# Patient Record
Sex: Female | Born: 1950 | Race: Black or African American | Hispanic: No | Marital: Married | Smoking: Never smoker
Health system: Southern US, Community
[De-identification: ages and names within clinical notes are randomized; demographics above are authoritative.]

## PROBLEM LIST (undated history)

## (undated) DIAGNOSIS — M797 Fibromyalgia: Secondary | ICD-10-CM

## (undated) DIAGNOSIS — B977 Papillomavirus as the cause of diseases classified elsewhere: Secondary | ICD-10-CM

## (undated) DIAGNOSIS — M2550 Pain in unspecified joint: Secondary | ICD-10-CM

## (undated) DIAGNOSIS — K222 Esophageal obstruction: Secondary | ICD-10-CM

## (undated) DIAGNOSIS — E739 Lactose intolerance, unspecified: Secondary | ICD-10-CM

## (undated) DIAGNOSIS — M8580 Other specified disorders of bone density and structure, unspecified site: Secondary | ICD-10-CM

## (undated) DIAGNOSIS — M549 Dorsalgia, unspecified: Secondary | ICD-10-CM

## (undated) DIAGNOSIS — G2581 Restless legs syndrome: Secondary | ICD-10-CM

## (undated) DIAGNOSIS — R55 Syncope and collapse: Secondary | ICD-10-CM

## (undated) DIAGNOSIS — R42 Dizziness and giddiness: Secondary | ICD-10-CM

## (undated) DIAGNOSIS — C801 Malignant (primary) neoplasm, unspecified: Secondary | ICD-10-CM

## (undated) DIAGNOSIS — R7303 Prediabetes: Secondary | ICD-10-CM

## (undated) DIAGNOSIS — G4700 Insomnia, unspecified: Secondary | ICD-10-CM

## (undated) DIAGNOSIS — G8929 Other chronic pain: Secondary | ICD-10-CM

## (undated) DIAGNOSIS — M419 Scoliosis, unspecified: Secondary | ICD-10-CM

## (undated) DIAGNOSIS — I10 Essential (primary) hypertension: Secondary | ICD-10-CM

## (undated) DIAGNOSIS — E010 Iodine-deficiency related diffuse (endemic) goiter: Secondary | ICD-10-CM

## (undated) DIAGNOSIS — M1990 Unspecified osteoarthritis, unspecified site: Secondary | ICD-10-CM

## (undated) DIAGNOSIS — R739 Hyperglycemia, unspecified: Secondary | ICD-10-CM

## (undated) DIAGNOSIS — Z780 Asymptomatic menopausal state: Secondary | ICD-10-CM

## (undated) DIAGNOSIS — K219 Gastro-esophageal reflux disease without esophagitis: Secondary | ICD-10-CM

## (undated) DIAGNOSIS — T7840XA Allergy, unspecified, initial encounter: Secondary | ICD-10-CM

## (undated) HISTORY — PX: TUBAL LIGATION: SHX77

## (undated) HISTORY — DX: Malignant (primary) neoplasm, unspecified: C80.1

## (undated) HISTORY — DX: Asymptomatic menopausal state: Z78.0

## (undated) HISTORY — DX: Hyperglycemia, unspecified: R73.9

## (undated) HISTORY — DX: Restless legs syndrome: G25.81

## (undated) HISTORY — DX: Iodine-deficiency related diffuse (endemic) goiter: E01.0

## (undated) HISTORY — PX: SPINE SURGERY: SHX786

## (undated) HISTORY — DX: Fibromyalgia: M79.7

## (undated) HISTORY — DX: Insomnia, unspecified: G47.00

## (undated) HISTORY — DX: Scoliosis, unspecified: M41.9

## (undated) HISTORY — PX: BUNIONECTOMY: SHX129

## (undated) HISTORY — DX: Dorsalgia, unspecified: M54.9

## (undated) HISTORY — DX: Prediabetes: R73.03

## (undated) HISTORY — DX: Allergy, unspecified, initial encounter: T78.40XA

## (undated) HISTORY — PX: TONSILLECTOMY: SUR1361

## (undated) HISTORY — DX: Syncope and collapse: R55

## (undated) HISTORY — DX: Pain in unspecified joint: M25.50

## (undated) HISTORY — DX: Esophageal obstruction: K22.2

## (undated) HISTORY — DX: Other chronic pain: G89.29

## (undated) HISTORY — DX: Dizziness and giddiness: R42

## (undated) HISTORY — PX: KNEE SURGERY: SHX244

## (undated) HISTORY — DX: Gastro-esophageal reflux disease without esophagitis: K21.9

## (undated) HISTORY — PX: COLONOSCOPY: SHX174

## (undated) HISTORY — DX: Lactose intolerance, unspecified: E73.9

## (undated) HISTORY — DX: Papillomavirus as the cause of diseases classified elsewhere: B97.7

## (undated) HISTORY — DX: Unspecified osteoarthritis, unspecified site: M19.90

## (undated) HISTORY — DX: Other specified disorders of bone density and structure, unspecified site: M85.80

## (undated) HISTORY — DX: Essential (primary) hypertension: I10

---

## 1998-04-12 HISTORY — PX: BREAST EXCISIONAL BIOPSY: SUR124

## 1998-04-12 HISTORY — PX: BREAST LUMPECTOMY: SHX2

## 1998-05-23 ENCOUNTER — Ambulatory Visit (HOSPITAL_BASED_OUTPATIENT_CLINIC_OR_DEPARTMENT_OTHER): Admission: RE | Admit: 1998-05-23 | Discharge: 1998-05-23 | Payer: Self-pay | Admitting: General Surgery

## 1998-06-17 ENCOUNTER — Ambulatory Visit (HOSPITAL_BASED_OUTPATIENT_CLINIC_OR_DEPARTMENT_OTHER): Admission: RE | Admit: 1998-06-17 | Discharge: 1998-06-17 | Payer: Self-pay | Admitting: Orthopaedic Surgery

## 1999-05-22 ENCOUNTER — Encounter: Admission: RE | Admit: 1999-05-22 | Discharge: 1999-05-22 | Payer: Self-pay | Admitting: Obstetrics and Gynecology

## 1999-05-22 ENCOUNTER — Encounter: Payer: Self-pay | Admitting: Obstetrics and Gynecology

## 2000-05-27 ENCOUNTER — Encounter: Payer: Self-pay | Admitting: Obstetrics and Gynecology

## 2000-05-27 ENCOUNTER — Encounter: Admission: RE | Admit: 2000-05-27 | Discharge: 2000-05-27 | Payer: Self-pay | Admitting: Obstetrics and Gynecology

## 2000-07-13 ENCOUNTER — Encounter (INDEPENDENT_AMBULATORY_CARE_PROVIDER_SITE_OTHER): Payer: Self-pay | Admitting: Specialist

## 2000-07-13 ENCOUNTER — Other Ambulatory Visit: Admission: RE | Admit: 2000-07-13 | Discharge: 2000-07-13 | Payer: Self-pay | Admitting: Obstetrics and Gynecology

## 2000-08-29 ENCOUNTER — Inpatient Hospital Stay (HOSPITAL_COMMUNITY): Admission: EM | Admit: 2000-08-29 | Discharge: 2000-08-31 | Payer: Self-pay | Admitting: Internal Medicine

## 2000-08-29 ENCOUNTER — Encounter: Payer: Self-pay | Admitting: Internal Medicine

## 2001-01-10 ENCOUNTER — Ambulatory Visit (HOSPITAL_COMMUNITY): Admission: RE | Admit: 2001-01-10 | Discharge: 2001-01-11 | Payer: Self-pay | Admitting: Orthopaedic Surgery

## 2001-04-12 HISTORY — PX: KNEE ARTHROSCOPY: SHX127

## 2002-02-06 ENCOUNTER — Ambulatory Visit (HOSPITAL_BASED_OUTPATIENT_CLINIC_OR_DEPARTMENT_OTHER): Admission: RE | Admit: 2002-02-06 | Discharge: 2002-02-06 | Payer: Self-pay | Admitting: Orthopaedic Surgery

## 2002-04-12 HISTORY — PX: SHOULDER SURGERY: SHX246

## 2002-06-26 ENCOUNTER — Ambulatory Visit (HOSPITAL_BASED_OUTPATIENT_CLINIC_OR_DEPARTMENT_OTHER): Admission: RE | Admit: 2002-06-26 | Discharge: 2002-06-26 | Payer: Self-pay | Admitting: Orthopaedic Surgery

## 2004-05-29 ENCOUNTER — Ambulatory Visit: Payer: Self-pay | Admitting: Internal Medicine

## 2004-05-29 ENCOUNTER — Encounter: Admission: RE | Admit: 2004-05-29 | Discharge: 2004-05-29 | Payer: Self-pay | Admitting: Internal Medicine

## 2004-06-02 ENCOUNTER — Ambulatory Visit: Payer: Self-pay

## 2004-06-24 ENCOUNTER — Ambulatory Visit: Payer: Self-pay | Admitting: Internal Medicine

## 2004-08-30 ENCOUNTER — Emergency Department (HOSPITAL_COMMUNITY): Admission: EM | Admit: 2004-08-30 | Discharge: 2004-08-30 | Payer: Self-pay | Admitting: Emergency Medicine

## 2004-09-02 ENCOUNTER — Ambulatory Visit: Payer: Self-pay | Admitting: Internal Medicine

## 2004-09-24 ENCOUNTER — Ambulatory Visit (HOSPITAL_COMMUNITY): Admission: RE | Admit: 2004-09-24 | Discharge: 2004-09-24 | Payer: Self-pay | Admitting: Obstetrics and Gynecology

## 2004-10-06 ENCOUNTER — Ambulatory Visit: Payer: Self-pay | Admitting: Gastroenterology

## 2004-11-03 ENCOUNTER — Ambulatory Visit: Payer: Self-pay | Admitting: Gastroenterology

## 2004-11-30 ENCOUNTER — Ambulatory Visit: Payer: Self-pay | Admitting: Gastroenterology

## 2004-12-01 ENCOUNTER — Ambulatory Visit (HOSPITAL_COMMUNITY): Admission: RE | Admit: 2004-12-01 | Discharge: 2004-12-01 | Payer: Self-pay | Admitting: Gastroenterology

## 2004-12-07 ENCOUNTER — Ambulatory Visit: Payer: Self-pay | Admitting: Internal Medicine

## 2004-12-17 ENCOUNTER — Ambulatory Visit: Payer: Self-pay | Admitting: Gastroenterology

## 2004-12-17 ENCOUNTER — Ambulatory Visit (HOSPITAL_COMMUNITY): Admission: RE | Admit: 2004-12-17 | Discharge: 2004-12-17 | Payer: Self-pay | Admitting: Gastroenterology

## 2005-01-20 ENCOUNTER — Ambulatory Visit: Payer: Self-pay | Admitting: Gastroenterology

## 2005-03-09 ENCOUNTER — Ambulatory Visit: Payer: Self-pay | Admitting: Internal Medicine

## 2005-03-26 ENCOUNTER — Encounter: Payer: Self-pay | Admitting: Internal Medicine

## 2005-10-05 ENCOUNTER — Ambulatory Visit (HOSPITAL_COMMUNITY): Admission: RE | Admit: 2005-10-05 | Discharge: 2005-10-05 | Payer: Self-pay | Admitting: Obstetrics and Gynecology

## 2005-10-19 ENCOUNTER — Ambulatory Visit (HOSPITAL_COMMUNITY): Admission: RE | Admit: 2005-10-19 | Discharge: 2005-10-19 | Payer: Self-pay | Admitting: Obstetrics and Gynecology

## 2005-10-20 ENCOUNTER — Encounter: Admission: RE | Admit: 2005-10-20 | Discharge: 2005-10-20 | Payer: Self-pay | Admitting: Obstetrics and Gynecology

## 2005-12-17 ENCOUNTER — Ambulatory Visit: Payer: Self-pay | Admitting: Internal Medicine

## 2006-02-01 ENCOUNTER — Ambulatory Visit: Payer: Self-pay | Admitting: Internal Medicine

## 2006-02-01 LAB — CONVERTED CEMR LAB
AST: 24 units/L (ref 0–37)
Albumin: 4 g/dL (ref 3.5–5.2)

## 2006-04-20 ENCOUNTER — Encounter: Payer: Self-pay | Admitting: Internal Medicine

## 2006-08-17 ENCOUNTER — Encounter: Payer: Self-pay | Admitting: Internal Medicine

## 2006-10-31 ENCOUNTER — Encounter: Admission: RE | Admit: 2006-10-31 | Discharge: 2006-10-31 | Payer: Self-pay | Admitting: Obstetrics and Gynecology

## 2006-11-11 LAB — CONVERTED CEMR LAB: Pap Smear: NORMAL

## 2006-12-21 ENCOUNTER — Ambulatory Visit: Payer: Self-pay | Admitting: Internal Medicine

## 2006-12-21 DIAGNOSIS — I10 Essential (primary) hypertension: Secondary | ICD-10-CM

## 2006-12-21 DIAGNOSIS — E042 Nontoxic multinodular goiter: Secondary | ICD-10-CM

## 2006-12-21 DIAGNOSIS — G2581 Restless legs syndrome: Secondary | ICD-10-CM

## 2006-12-21 DIAGNOSIS — M549 Dorsalgia, unspecified: Secondary | ICD-10-CM | POA: Insufficient documentation

## 2006-12-21 DIAGNOSIS — K222 Esophageal obstruction: Secondary | ICD-10-CM

## 2006-12-21 DIAGNOSIS — J309 Allergic rhinitis, unspecified: Secondary | ICD-10-CM | POA: Insufficient documentation

## 2006-12-21 DIAGNOSIS — M797 Fibromyalgia: Secondary | ICD-10-CM | POA: Insufficient documentation

## 2006-12-21 DIAGNOSIS — K219 Gastro-esophageal reflux disease without esophagitis: Secondary | ICD-10-CM

## 2007-01-18 ENCOUNTER — Encounter: Payer: Self-pay | Admitting: Internal Medicine

## 2007-03-17 ENCOUNTER — Telehealth (INDEPENDENT_AMBULATORY_CARE_PROVIDER_SITE_OTHER): Payer: Self-pay | Admitting: *Deleted

## 2007-03-31 ENCOUNTER — Telehealth (INDEPENDENT_AMBULATORY_CARE_PROVIDER_SITE_OTHER): Payer: Self-pay | Admitting: *Deleted

## 2007-05-29 ENCOUNTER — Telehealth (INDEPENDENT_AMBULATORY_CARE_PROVIDER_SITE_OTHER): Payer: Self-pay | Admitting: *Deleted

## 2007-06-20 ENCOUNTER — Ambulatory Visit: Payer: Self-pay | Admitting: Gastroenterology

## 2007-06-22 ENCOUNTER — Ambulatory Visit: Payer: Self-pay | Admitting: Gastroenterology

## 2007-06-22 ENCOUNTER — Encounter: Payer: Self-pay | Admitting: Internal Medicine

## 2007-06-22 ENCOUNTER — Encounter: Payer: Self-pay | Admitting: Gastroenterology

## 2007-07-20 ENCOUNTER — Telehealth: Payer: Self-pay | Admitting: Internal Medicine

## 2007-08-16 ENCOUNTER — Ambulatory Visit: Payer: Self-pay | Admitting: Gastroenterology

## 2007-08-28 ENCOUNTER — Telehealth (INDEPENDENT_AMBULATORY_CARE_PROVIDER_SITE_OTHER): Payer: Self-pay | Admitting: *Deleted

## 2007-08-28 ENCOUNTER — Encounter (INDEPENDENT_AMBULATORY_CARE_PROVIDER_SITE_OTHER): Payer: Self-pay | Admitting: *Deleted

## 2007-09-27 ENCOUNTER — Encounter: Payer: Self-pay | Admitting: Internal Medicine

## 2007-10-30 ENCOUNTER — Ambulatory Visit: Payer: Self-pay | Admitting: Internal Medicine

## 2007-11-01 ENCOUNTER — Encounter: Admission: RE | Admit: 2007-11-01 | Discharge: 2007-11-01 | Payer: Self-pay | Admitting: Obstetrics and Gynecology

## 2007-11-06 LAB — CONVERTED CEMR LAB
ALT: 21 units/L (ref 0–35)
AST: 24 units/L (ref 0–37)
BUN: 16 mg/dL (ref 6–23)
Calcium: 9.8 mg/dL (ref 8.4–10.5)
Chloride: 101 meq/L (ref 96–112)
Creatinine, Ser: 0.9 mg/dL (ref 0.4–1.2)
GFR calc Af Amer: 83 mL/min
GFR calc non Af Amer: 69 mL/min
Glucose, Bld: 79 mg/dL (ref 70–99)
HDL: 55.7 mg/dL (ref >39.0)
LDL Cholesterol: 122 mg/dL — ABNORMAL HIGH (ref 0–99)
Potassium: 3.7 meq/L (ref 3.5–5.1)
VLDL: 12 mg/dL (ref 0–40)

## 2007-11-21 ENCOUNTER — Telehealth (INDEPENDENT_AMBULATORY_CARE_PROVIDER_SITE_OTHER): Payer: Self-pay | Admitting: *Deleted

## 2007-11-27 ENCOUNTER — Telehealth (INDEPENDENT_AMBULATORY_CARE_PROVIDER_SITE_OTHER): Payer: Self-pay | Admitting: *Deleted

## 2007-11-29 ENCOUNTER — Encounter: Payer: Self-pay | Admitting: Internal Medicine

## 2007-12-01 ENCOUNTER — Telehealth (INDEPENDENT_AMBULATORY_CARE_PROVIDER_SITE_OTHER): Payer: Self-pay | Admitting: *Deleted

## 2008-01-02 ENCOUNTER — Encounter: Payer: Self-pay | Admitting: Internal Medicine

## 2008-03-25 ENCOUNTER — Telehealth (INDEPENDENT_AMBULATORY_CARE_PROVIDER_SITE_OTHER): Payer: Self-pay | Admitting: *Deleted

## 2008-04-16 ENCOUNTER — Ambulatory Visit: Payer: Self-pay | Admitting: Diagnostic Radiology

## 2008-04-16 ENCOUNTER — Inpatient Hospital Stay (HOSPITAL_COMMUNITY): Admission: EM | Admit: 2008-04-16 | Discharge: 2008-04-18 | Payer: Self-pay | Admitting: Internal Medicine

## 2008-04-16 ENCOUNTER — Ambulatory Visit: Payer: Self-pay | Admitting: Internal Medicine

## 2008-04-18 ENCOUNTER — Telehealth (INDEPENDENT_AMBULATORY_CARE_PROVIDER_SITE_OTHER): Payer: Self-pay | Admitting: *Deleted

## 2008-04-22 ENCOUNTER — Encounter: Admission: RE | Admit: 2008-04-22 | Discharge: 2008-04-22 | Payer: Self-pay | Admitting: Internal Medicine

## 2008-04-23 ENCOUNTER — Ambulatory Visit: Payer: Self-pay

## 2008-04-23 ENCOUNTER — Encounter: Payer: Self-pay | Admitting: Internal Medicine

## 2008-04-26 ENCOUNTER — Telehealth (INDEPENDENT_AMBULATORY_CARE_PROVIDER_SITE_OTHER): Payer: Self-pay | Admitting: *Deleted

## 2008-04-30 ENCOUNTER — Telehealth (INDEPENDENT_AMBULATORY_CARE_PROVIDER_SITE_OTHER): Payer: Self-pay | Admitting: *Deleted

## 2008-05-01 ENCOUNTER — Encounter: Payer: Self-pay | Admitting: Internal Medicine

## 2008-05-08 ENCOUNTER — Telehealth (INDEPENDENT_AMBULATORY_CARE_PROVIDER_SITE_OTHER): Payer: Self-pay | Admitting: *Deleted

## 2008-05-16 ENCOUNTER — Encounter: Payer: Self-pay | Admitting: Internal Medicine

## 2008-05-16 ENCOUNTER — Telehealth (INDEPENDENT_AMBULATORY_CARE_PROVIDER_SITE_OTHER): Payer: Self-pay | Admitting: *Deleted

## 2008-07-04 ENCOUNTER — Telehealth (INDEPENDENT_AMBULATORY_CARE_PROVIDER_SITE_OTHER): Payer: Self-pay | Admitting: *Deleted

## 2008-09-03 ENCOUNTER — Encounter: Payer: Self-pay | Admitting: Internal Medicine

## 2008-09-11 ENCOUNTER — Encounter: Payer: Self-pay | Admitting: Internal Medicine

## 2008-09-12 ENCOUNTER — Encounter: Payer: Self-pay | Admitting: Internal Medicine

## 2008-09-12 ENCOUNTER — Telehealth (INDEPENDENT_AMBULATORY_CARE_PROVIDER_SITE_OTHER): Payer: Self-pay | Admitting: *Deleted

## 2008-09-17 ENCOUNTER — Telehealth (INDEPENDENT_AMBULATORY_CARE_PROVIDER_SITE_OTHER): Payer: Self-pay | Admitting: *Deleted

## 2008-10-18 ENCOUNTER — Telehealth (INDEPENDENT_AMBULATORY_CARE_PROVIDER_SITE_OTHER): Payer: Self-pay | Admitting: *Deleted

## 2008-10-21 ENCOUNTER — Telehealth (INDEPENDENT_AMBULATORY_CARE_PROVIDER_SITE_OTHER): Payer: Self-pay | Admitting: *Deleted

## 2008-11-13 ENCOUNTER — Encounter: Admission: RE | Admit: 2008-11-13 | Discharge: 2008-11-13 | Payer: Self-pay | Admitting: Internal Medicine

## 2008-11-13 LAB — HM MAMMOGRAPHY: HM Mammogram: NEGATIVE

## 2008-11-19 ENCOUNTER — Encounter (INDEPENDENT_AMBULATORY_CARE_PROVIDER_SITE_OTHER): Payer: Self-pay | Admitting: *Deleted

## 2008-12-26 ENCOUNTER — Telehealth (INDEPENDENT_AMBULATORY_CARE_PROVIDER_SITE_OTHER): Payer: Self-pay | Admitting: *Deleted

## 2009-01-01 ENCOUNTER — Ambulatory Visit: Payer: Self-pay | Admitting: Internal Medicine

## 2009-01-01 DIAGNOSIS — N951 Menopausal and female climacteric states: Secondary | ICD-10-CM

## 2009-01-01 DIAGNOSIS — F411 Generalized anxiety disorder: Secondary | ICD-10-CM | POA: Insufficient documentation

## 2009-01-02 ENCOUNTER — Encounter: Admission: RE | Admit: 2009-01-02 | Discharge: 2009-01-02 | Payer: Self-pay | Admitting: Internal Medicine

## 2009-01-07 LAB — CONVERTED CEMR LAB
Albumin: 4.1 g/dL (ref 3.5–5.2)
BUN: 17 mg/dL (ref 6–23)
Basophils Relative: 0.3 % (ref 0.0–3.0)
Bilirubin, Direct: 0 mg/dL (ref 0.0–0.3)
Creatinine, Ser: 1 mg/dL (ref 0.4–1.2)
Eosinophils Absolute: 0.1 10*3/uL (ref 0.0–0.7)
GFR calc non Af Amer: 73.41 mL/min (ref >60)
HCT: 37.9 % (ref 36.0–46.0)
Lymphocytes Relative: 32.7 % (ref 12.0–46.0)
Lymphs Abs: 1.7 10*3/uL (ref 0.7–4.0)
MCHC: 33.1 g/dL (ref 30.0–36.0)
MCV: 84.8 fL (ref 78.0–100.0)
Monocytes Absolute: 0.4 10*3/uL (ref 0.1–1.0)
Neutro Abs: 2.9 10*3/uL (ref 1.4–7.7)
Neutrophils Relative %: 57.3 % (ref 43.0–77.0)
Platelets: 137 10*3/uL — ABNORMAL LOW (ref 150.0–400.0)
Potassium: 3.3 meq/L — ABNORMAL LOW (ref 3.5–5.1)
RBC: 4.47 M/uL (ref 3.87–5.11)
TSH: 0.48 microintl units/mL (ref 0.35–5.50)
Total CHOL/HDL Ratio: 4
Total Protein: 7.6 g/dL (ref 6.0–8.3)
Triglycerides: 70 mg/dL (ref 0.0–149.0)
VLDL: 14 mg/dL (ref 0.0–40.0)

## 2009-01-09 ENCOUNTER — Telehealth (INDEPENDENT_AMBULATORY_CARE_PROVIDER_SITE_OTHER): Payer: Self-pay | Admitting: *Deleted

## 2009-01-10 HISTORY — PX: BIOPSY THYROID: PRO38

## 2009-01-14 ENCOUNTER — Other Ambulatory Visit: Admission: RE | Admit: 2009-01-14 | Discharge: 2009-01-14 | Payer: Self-pay | Admitting: Diagnostic Radiology

## 2009-01-14 ENCOUNTER — Encounter: Admission: RE | Admit: 2009-01-14 | Discharge: 2009-01-14 | Payer: Self-pay | Admitting: Internal Medicine

## 2009-01-14 ENCOUNTER — Encounter (INDEPENDENT_AMBULATORY_CARE_PROVIDER_SITE_OTHER): Payer: Self-pay | Admitting: Diagnostic Radiology

## 2009-01-17 ENCOUNTER — Telehealth: Payer: Self-pay | Admitting: Internal Medicine

## 2009-01-17 ENCOUNTER — Encounter: Payer: Self-pay | Admitting: Internal Medicine

## 2009-07-11 DIAGNOSIS — R55 Syncope and collapse: Secondary | ICD-10-CM

## 2009-07-11 HISTORY — DX: Syncope and collapse: R55

## 2009-07-20 ENCOUNTER — Ambulatory Visit: Payer: Self-pay | Admitting: Diagnostic Radiology

## 2009-07-20 ENCOUNTER — Encounter (HOSPITAL_COMMUNITY): Payer: Self-pay | Admitting: Emergency Medicine

## 2009-07-20 ENCOUNTER — Inpatient Hospital Stay (HOSPITAL_COMMUNITY): Admission: AD | Admit: 2009-07-20 | Discharge: 2009-07-22 | Payer: Self-pay | Admitting: Internal Medicine

## 2009-07-21 ENCOUNTER — Ambulatory Visit: Payer: Self-pay | Admitting: Vascular Surgery

## 2009-07-21 ENCOUNTER — Telehealth: Payer: Self-pay | Admitting: Internal Medicine

## 2009-07-21 ENCOUNTER — Encounter (INDEPENDENT_AMBULATORY_CARE_PROVIDER_SITE_OTHER): Payer: Self-pay | Admitting: Internal Medicine

## 2009-07-22 ENCOUNTER — Encounter: Payer: Self-pay | Admitting: Internal Medicine

## 2009-07-28 ENCOUNTER — Ambulatory Visit: Payer: Self-pay | Admitting: Internal Medicine

## 2009-07-30 LAB — CONVERTED CEMR LAB
CO2: 32 meq/L (ref 19–32)
Creatinine, Ser: 1 mg/dL (ref 0.4–1.2)
Glucose, Bld: 75 mg/dL (ref 70–99)
Sodium: 141 meq/L (ref 135–145)

## 2009-09-23 ENCOUNTER — Ambulatory Visit (HOSPITAL_COMMUNITY): Admission: RE | Admit: 2009-09-23 | Discharge: 2009-09-23 | Payer: Self-pay | Admitting: Obstetrics and Gynecology

## 2009-11-12 ENCOUNTER — Telehealth (INDEPENDENT_AMBULATORY_CARE_PROVIDER_SITE_OTHER): Payer: Self-pay | Admitting: *Deleted

## 2009-11-21 ENCOUNTER — Encounter: Payer: Self-pay | Admitting: Internal Medicine

## 2010-03-20 ENCOUNTER — Encounter: Payer: Self-pay | Admitting: Internal Medicine

## 2010-03-20 ENCOUNTER — Ambulatory Visit: Payer: Self-pay | Admitting: Internal Medicine

## 2010-03-23 LAB — CONVERTED CEMR LAB
CO2: 31 meq/L (ref 19–32)
Calcium: 9.7 mg/dL (ref 8.4–10.5)
Creatinine, Ser: 0.94 mg/dL (ref 0.40–1.20)
Potassium: 3.7 meq/L (ref 3.5–5.3)

## 2010-05-03 ENCOUNTER — Encounter: Payer: Self-pay | Admitting: Obstetrics and Gynecology

## 2010-05-03 ENCOUNTER — Encounter: Payer: Self-pay | Admitting: Gastroenterology

## 2010-05-04 ENCOUNTER — Encounter: Payer: Self-pay | Admitting: Internal Medicine

## 2010-05-05 ENCOUNTER — Encounter: Payer: Self-pay | Admitting: Internal Medicine

## 2010-05-10 LAB — CONVERTED CEMR LAB
AST: 36 units/L (ref 0–37)
BUN: 17 mg/dL (ref 6–23)
Basophils Relative: 0.8 % (ref 0.0–1.0)
Calcium: 10 mg/dL (ref 8.4–10.5)
Cholesterol: 200 mg/dL (ref 0–200)
Creatinine, Ser: 1 mg/dL (ref 0.4–1.2)
Eosinophils Relative: 2.2 % (ref 0.0–5.0)
GFR calc Af Amer: 74 mL/min
Glucose, Bld: 88 mg/dL (ref 70–99)
Hemoglobin: 12.3 g/dL (ref 12.0–15.0)
LDL Cholesterol: 134 mg/dL — ABNORMAL HIGH (ref 0–99)
MCV: 82.9 fL (ref 78.0–100.0)
Monocytes Absolute: 0.4 10*3/uL (ref 0.2–0.7)
Neutro Abs: 3.3 10*3/uL (ref 1.4–7.7)
Platelets: 171 10*3/uL (ref 150–400)
RBC: 4.53 M/uL (ref 3.87–5.11)
Sodium: 141 meq/L (ref 135–145)
WBC: 5.4 10*3/uL (ref 4.5–10.5)

## 2010-05-11 ENCOUNTER — Telehealth: Payer: Self-pay | Admitting: Internal Medicine

## 2010-05-12 NOTE — Progress Notes (Signed)
Summary: Refill Request  Phone Note Refill Request Message from:  Patient on November 12, 2009 11:09 AM  Refills Requested: Medication #1:  PREVACID SOLUTAB 30 MG TBDP DISSOLVE 1 TAB IN MOUTH two times a day   Dosage confirmed as above?Dosage Confirmed   Brand Name Necessary? No   Supply Requested: 1 month [facility] [address], [facility]  Next Appointment Scheduled: none Initial call taken by: Harold Barban,  November 12, 2009 11:09 AM    Prescriptions: PREVACID SOLUTAB 30 MG TBDP (LANSOPRAZOLE) DISSOLVE 1 TAB IN MOUTH two times a day  #180 x 3   Entered by:   Army Fossa CMA   Authorized by:   Nolon Rod. Paz MD   Signed by:   Army Fossa CMA on 11/12/2009   Method used:   Electronically to        [facility] (retail)       [address]       [place], [place]  [address]       Ph: [phone] or [phone]       Fax: [phone]   RxID:   [id number]

## 2010-05-12 NOTE — Letter (Signed)
Summary: Encounter Notice/[facility]  Encounter Notice/[facility]   Imported By: Lanelle Bal 07/28/2009 11:04:19  _____________________________________________________________________  External Attachment:    Type:   Image     Comment:   External Document

## 2010-05-12 NOTE — Progress Notes (Signed)
Summary: triage call a nurse/  left msg for pt to call 2x  Phone Note Other Incoming   Summary of Call: [facility] Triage Call Report Triage Record Num: [id number] Operator: Jaci Carrel Patient Name: Leah Cameron Call Date & Time: 07/20/2009 11:01:29AM Patient Phone: [phone] PCP: Nolon Rod. Paz Patient Gender: Female PCP Fax : Patient DOB: Mar 27, 1951 Practice Name: [facility] Reason for Call: Pt calling with concerns about abdominal pain last pm. She woke up on floor last PM- she fell off toilet and hit shower stall. Pt states that she is sore and weak today. Today her sx: weakness, arm sore from fall. She denies any numbness/tingling, cloudy thought process. Advised EDProtocol( s) Used: Neurological Deficits Recommended Outcome per Protocol: See ED Immediately Reason for Outcome: Had recent (within last 24 hours) episode(s) of paralysis, weakness, numbness/tingling of an arm or leg or the face, especially on same side of body, AND NOW RESOLVED Care Advice:  ~ Another adult should drive. 04/  Follow-up for Phone Call        In E-Chart pt went to [facility] over weekend; after fainting  and falling while on toilet. Per E-Chart notes pt wanted to come in since a family hx of strokes. labs, xray and CT of head was done. ct was neg, xray neg.  -Pt was D/C stable Left msg for pt to call 2x got VM  Follow-up by: Kandice Hams,  July 21, 2009 9:40 AM  Additional Follow-up for Phone Call Additional follow up Details #1::        noted, thank you  Additional Follow-up by: [facility] E. Paz MD,  July 21, 2009 11:19 AM

## 2010-05-12 NOTE — Letter (Signed)
Summary: fibromyalgia followup, rheumatology  [facility]   Imported By: Lanelle Bal 12/04/2009 12:59:06  _____________________________________________________________________  External Attachment:    Type:   Image     Comment:   External Document

## 2010-05-12 NOTE — Assessment & Plan Note (Signed)
Summary: hosp followup/alr   Vital Signs:  Patient profile:   60 year old female Height:      67.75 inches Weight:      170.4 pounds BMI:     26.20 Pulse rate:   84 / minute BP sitting:   140 / 80  Vitals Entered By: Shary Decamp (July 28, 2009 10:31 AM) CC: hosp f/u   History of Present Illness: hospital follow-up, chart reviewed:      DATE OF ADMISSION:  07/20/2009   DATE OF DISCHARGE:  07/22/2009     DISCHARGE DIAGNOSES:   1. Syncope, likely secondary to his overtreatment of hypertension.   2. Possible orthostatic hypotension.   3. Spinal stenosis.   4. Fibromyalgia.   5. Restless leg syndrome.       CT of the  head without contrast was negative.   A chest x-ray did not show any  acute disease.    The patient had an MRI/MRA of the brain that  did not show any evidence of CVA.  The MRA showed anatomic variation in  both anterior and posterior circulation. There is diffuse tortuosity of the intracranial  arteries, that indicates chronic hypertension.   A 2-D echo was also done  that showed an EF of 55-60% without any regional wall motion  abnormalities.   Telemetry monitoring was negative.  Cardiac enzymes were  cycled and this was negative.   Carotid duplex ultrasound was done and  was found to be negative.   labs: Prior to discharge here potassium went up to 3.7, creatinine 0.8 TFTs norma B12 normal total cholesterol 176, triglyceride 94, LDL 109, HDL 48   The patient has had orthostatics in the ED as  well as inpatient.  Her only antihypertensive medication that was  continued was Lotensin, her Norvasc and HCTZ were discontinued.    Ambien was also discontinued since she had the syncope prior to taken it she is now on temazepam     Current Medications (verified): 1)  Lotensin 10 Mg  Tabs (Benazepril Hcl) .Marland Kitchen.. 1 By Mouth Qd 2)  Lexapro 10 Mg  Tabs (Escitalopram Oxalate) .Marland Kitchen.. 1 By Mouth Qd 3)  Restoril 15 Mg Caps (Temazepam) .Marland Kitchen.. 1 By Mouth At Bedtime As  Needed 4)  Asa 81 Mg .Marland Kitchen.. 1 Qd 5)  Calcium 6)  Vitamin B 7)  Lidoderm Patch .... 1 Every 12 Hours; Up To 3 Every 12 Hours 8)  Prevacid Solutab 30 Mg Tbdp (Lansoprazole) .... Dissolve 1 Tab in Mouth Two Times A Day 9)  Valium 2 Mg Tabs (Diazepam) .Marland Kitchen.. 1-2 Before Procedure 10)  K-Tabs 10 Meq Cr-Tabs (Potassium Chloride) .... Two Times A Day  Allergies (verified): 1)  ! Flexeril 2)  ! Ultram 3)  ! Pcn  Past History:  Past Medical History: GERD HTN Allergic rhinitis RLS Hx of THYROMEGALY  BACK PAIN, CHRONIC  STRICTURE, ESOPHAGEAL , last EGD and dilatation 3-09 FIBROMYALGIA , sees Dr Victory Dakin Cardiolite 05-2004 neg Menopause --on lexapro , was started by gyn  syncope 4/11, admitted, thought to be due to over treatment of hypertension  Social History: Reviewed history from 01/01/2009 and no changes required. Married two children Retired 2001, [employer] disable  active-- does some walking  diet-- doing nutrasystem   Review of Systems       Jeralyn Bennett they admission to the hospital she is doing well, no further syncope. Denies chest pain, headaches, palpitations. She was also told that the EKGs were different from previous  Physical Exam  General:  alert, well-developed, and well-nourished.   Lungs:  normal respiratory effort, no intercostal retractions, no accessory muscle use, and normal breath sounds.   Heart:  normal rate, regular rhythm, and no murmur.   Extremities:  no pretibial edema bilaterally  Psych:  Oriented X3, good eye contact, not anxious appearing, and not depressed appearing.     Impression & Recommendations:  Problem # 1:  SYNCOPE (ICD-780.2) the patient had a syncopal episode a few days ago, she reports that she had severe abdominal cramps prior to the events. the etiology of the syncope may have been over treated hypertension but also a vaso vagal  episode  due to cramps. workup was negative in the hospital EKG at the hospital was at  baseline Plan: Observation  Problem # 2:  HYPERTENSION (ICD-401.9)  see #1 , after the admission to the hospital with a syncope she was found to be orthostatic and hypokalemic now she is off Norvasc and hydrochlorothiazide, taking potassium supplements ambulatory BPs before were 110, today SBP 140; no ambulatory BPs since admission Plan: BMP today discontinue potassium monitor her BPs, if they are more than 140/85, she  may need to restart a low dose of Norvasc The following medications were removed from the medication list:    Norvasc 10 Mg Tabs (Amlodipine besylate) .Marland Kitchen... 1 by mouth qd    Hydrochlorothiazide 25 Mg Tabs (Hydrochlorothiazide) .Marland Kitchen... 1 by mouth qd Her updated medication list for this problem includes:    Lotensin 10 Mg Tabs (Benazepril hcl) .Marland Kitchen... 1 by mouth qd  Orders: TLB-BMP (Basic Metabolic Panel-BMET) (80048-METABOL) Venipuncture ([address])  Problem # 3:  INSOMNIA-SLEEP DISORDER-UNSPEC (ICD-780.52) Ambien was discontinued  after her syncope. Now on temazepam and doing well.  Refill temazepam Her updated medication list for this problem includes:    Restoril 15 Mg Caps (Temazepam) .Marland Kitchen... 1 by mouth at bedtime as needed  Complete Medication List: 1)  Lotensin 10 Mg Tabs (Benazepril hcl) .Marland Kitchen.. 1 by mouth qd 2)  Lexapro 10 Mg Tabs (Escitalopram oxalate) .Marland Kitchen.. 1 by mouth qd 3)  Restoril 15 Mg Caps (Temazepam) .Marland Kitchen.. 1 by mouth at bedtime as needed 4)  Asa 81 Mg  .Marland Kitchen.. 1 qd 5)  Calcium  6)  Vitamin B  7)  Lidoderm Patch  .... 1 every 12 hours; up to 3 every 12 hours 8)  Prevacid Solutab 30 Mg Tbdp (Lansoprazole) .... Dissolve 1 tab in mouth two times a day 9)  Valium 2 Mg Tabs (Diazepam) .Marland Kitchen.. 1-2 before procedure  Patient Instructions: 1)  Check your blood pressure 2 or 3 times a week. If it is more than 140/85 consistently,please let us know  2)  Please schedule a follow-up appointment in 2 months.  Prescriptions: RESTORIL 15 MG CAPS (TEMAZEPAM) 1 by mouth at bedtime  as needed  #30 x 3   Entered and Authorized by:   Elita Quick E. Syd Newsome MD   Signed by:   Nolon Rod. Carizma Dunsworth MD on 07/28/2009   Method used:   Print then Give to Patient   RxID:   [id number]

## 2010-05-12 NOTE — Progress Notes (Signed)
Summary: fyi pt was admitted [facility]  Phone Note Call from Patient   Caller: Patient Summary of Call: pt called she is in [facility] room 1403 --she was admitted Initial call taken by: Kandice Hams,  July 21, 2009 3:28 PM  Follow-up for Phone Call        hospital chart reviewed Dx syncope MRI MRA brain --no acute  will see her once she is discharged [facility] E. Bejamin Hackbart MD  July 21, 2009 7:58 PM

## 2010-05-14 NOTE — Assessment & Plan Note (Signed)
Summary: f/u visit/drb   Vital Signs:  Patient profile:   60 year old female Weight:      173.50 pounds Pulse rate:   100 / minute Pulse rhythm:   regular BP sitting:   122 / 80  (left arm) Cuff size:   large  Vitals Entered By: Army Fossa CMA (March 20, 2010 4:03 PM) CC: Follow up visit.  Comments Discuss norvasc and HCTZ was d/c in hospital. Pt has been taking [facility]   History of Present Illness: since the last visit on April 2011, patient saw her blood pressure going up, she restarted HCTZ amlodipine. She has been doing that for several months, ambulatory BP is normal. Denies side effects.  Review of systems  denies dizziness or fatigue GERD symptoms well controlled For the last few days her back pain has resurfaced, she plans to call her rheumatologist or orthopedic doctor if that becomes a more severe problem  Current Medications (verified): 1)  Lotensin 10 Mg  Tabs (Benazepril Hcl) .Marland Kitchen.. 1 By Mouth Qd 2)  Lexapro 10 Mg  Tabs (Escitalopram Oxalate) .Marland Kitchen.. 1 By Mouth Qd 3)  Ambien 10 Mg Tabs (Zolpidem Tartrate) .Marland Kitchen.. 1 By Mouth At Bedtime As Needed 4)  Asa 81 Mg .Marland Kitchen.. 1 Qd 5)  Calcium 6)  Vitamin B 7)  Lidoderm Patch .... 1 Every 12 Hours; Up To 3 Every 12 Hours 8)  Prevacid Solutab 30 Mg Tbdp (Lansoprazole) .... Dissolve 1 Tab in Mouth Two Times A Day 9)  Vicodin 5-500 Mg Tabs (Hydrocodone-Acetaminophen) .... As Needed (Rx'd By Ra Doc.)  Allergies (verified): 1)  ! Flexeril 2)  ! Ultram 3)  ! Pcn  Past History:  Past Medical History: Reviewed history from 07/28/2009 and no changes required. GERD HTN Allergic rhinitis RLS Hx of THYROMEGALY  BACK PAIN, CHRONIC  STRICTURE, ESOPHAGEAL , last EGD and dilatation 3-09 FIBROMYALGIA , sees Dr Victory Dakin Cardiolite 05-2004 neg Menopause --on lexapro , was started by gyn  syncope 4/11, admitted, thought to be due to over treatment of hypertension  Past Surgical History: Reviewed history from  10/30/2007 and no changes required. lumpectomy - 2000 rt knee surgery 2002 rt knee scope 2003 shoulder surgery 2004  Social History: Reviewed history from 01/01/2009 and no changes required. Married two children Retired 2001, [employer] disable  active-- does some walking  diet-- doing nutrasystem   Physical Exam  General:  alert, well-developed, and well-nourished.   Lungs:  normal respiratory effort, no intercostal retractions, no accessory muscle use, and normal breath sounds.   Heart:  normal rate, regular rhythm, and no murmur.   Extremities:  no pretibial edema bilaterally  Psych:  Oriented X3, good eye contact, not anxious appearing, and not depressed appearing.     Impression & Recommendations:  Problem # 1:  HYPERTENSION (ICD-401.9) HCTZ and Norvasc were discontinued 4-11 after a syncopal spell and hypokalemia. Her BP gradually went up, she restarted such medicines a few months ago and is doing very well. RF  all meds Labs Her updated medication list for this problem includes:    Hydrochlorothiazide 25 Mg Tabs (Hydrochlorothiazide) .Marland Kitchen... 1 by mouth once daily    Norvasc 10 Mg Tabs (Amlodipine besylate) .Marland Kitchen... 1 by mouth once daily    Lotensin 10 Mg Tabs (Benazepril hcl) .Marland Kitchen... 1 by mouth qd  BP today: 122/80 Prior BP: 140/80 (07/28/2009)  Labs Reviewed: K+: 4.1 (07/28/2009) Creat: : 1.0 (07/28/2009)   Chol: 163 (01/01/2009)   HDL: 43.40 (  01/01/2009)   LDL: 106 (01/01/2009)   TG: 70.0 (01/01/2009)  Problem # 2:  GERD (ICD-530.81) well-controlled, refill medicines Her updated medication list for this problem includes:    Prevacid Solutab 30 Mg Tbdp (Lansoprazole) .Marland Kitchen... Dissolve 1 tab in mouth two times a day  Complete Medication List: 1)  Hydrochlorothiazide 25 Mg Tabs (Hydrochlorothiazide) .Marland Kitchen.. 1 by mouth once daily 2)  Norvasc 10 Mg Tabs (Amlodipine besylate) .Marland Kitchen.. 1 by mouth once daily 3)  Lotensin 10 Mg Tabs (Benazepril hcl) .Marland Kitchen.. 1 by mouth qd 4)   Lexapro 10 Mg Tabs (Escitalopram oxalate) .Marland Kitchen.. 1 by mouth qd 5)  Ambien 10 Mg Tabs (Zolpidem tartrate) .Marland Kitchen.. 1 by mouth at bedtime as needed 6)  Asa 81 Mg  .Marland Kitchen.. 1 qd 7)  Calcium  8)  Vitamin B  9)  Lidoderm Patch  .... 1 every 12 hours; up to 3 every 12 hours 10)  Prevacid Solutab 30 Mg Tbdp (Lansoprazole) .... Dissolve 1 tab in mouth two times a day 11)  Vicodin 5-500 Mg Tabs (Hydrocodone-acetaminophen) .... As needed (rx'd by ra doc.)  Other Orders: Admin 1st Vaccine (04540) Flu Vaccine 53yrs + (98119) Venipuncture (14782)  Patient Instructions: 1)  Please schedule a follow-up appointment in 3 to 4 months, fasting, physical exam  Prescriptions: PREVACID SOLUTAB 30 MG TBDP (LANSOPRAZOLE) DISSOLVE 1 TAB IN MOUTH two times a day  #180 x 3   Entered by:   Army Fossa CMA   Authorized by:   Nolon Rod. Paz MD   Signed by:   Army Fossa CMA on 03/20/2010   Method used:   Faxed to ...       [facility]             , [place]         Ph:        Fax: [phone]   RxID:   [id number] LEXAPRO 10 MG  TABS (ESCITALOPRAM OXALATE) 1 by mouth qd  #90 x 3   Entered by:   Army Fossa CMA   Authorized by:   Nolon Rod. Paz MD   Signed by:   Army Fossa CMA on 03/20/2010   Method used:   Faxed to ...       [facility] ([facility])             , [place]         Ph:        Fax: [phone]   RxID:   [id number] LOTENSIN 10 MG  TABS (BENAZEPRIL HCL) 1 by mouth qd  #90 x 3   Entered by:   Army Fossa CMA   Authorized by:   Nolon Rod. Paz MD   Signed by:   Army Fossa CMA on 03/20/2010   Method used:   Faxed to ...       [facility] (mail-order)             , [place]         Ph:        Fax: [phone]   RxID:   [id number] NORVASC 10 MG TABS (AMLODIPINE BESYLATE) 1 by mouth once daily  #90 x 3   Entered by:   Army Fossa CMA   Authorized by:   Nolon Rod. Paz MD   Signed by:   Army Fossa CMA on 03/20/2010   Method used:   Faxed to ...       [facility]  [employer] [facility])             ,  [place]         Ph:        Fax: [phone]   RxID:   [id number] HYDROCHLOROTHIAZIDE 25 MG TABS (HYDROCHLOROTHIAZIDE) 1 by mouth once daily  #90 x 3   Entered by:   Army Fossa CMA   Authorized by:   Nolon Rod. Paz MD   Signed by:   Army Fossa CMA on 03/20/2010   Method used:   Faxed to ...       [facility] [employer])             , [place]         Ph:        Fax: [phone]   RxID:   [id number]    Orders Added: 1)  Admin 1st Vaccine [90471] 2)  Flu Vaccine 64yrs + [63875] 3)  Venipuncture [64332] 4)  Est. Patient Level III [95188] Flu Vaccine Consent Questions     Do you have a history of severe allergic reactions to this vaccine? no    Any prior history of allergic reactions to egg and/or gelatin? no    Do you have a sensitivity to the preservative Thimersol? no    Do you have a past history of Guillan-Barre Syndrome? no    Do you currently have an acute febrile illness? no    Have you ever had a severe reaction to latex? no    Vaccine information given and explained to patient? yes    Are you currently pregnant? no    Lot Number:[id number]   Exp Date:10/10/2010   Site Given  Right Deltoid IM 1)  Admin 1st Vaccine [90471] 2)  Flu Vaccine 63yrs + [41660]      .lbflu1

## 2010-05-20 NOTE — Progress Notes (Signed)
Summary: Med Change  Phone Note From Pharmacy   Caller: [facility] [address] [phone]* Summary of Call: Per Lansoprazole ODT DR 30mg  rx:  Handwritten note stating: This RX is no longer avaible in solutabs. Can we avaible get this switched to regular prevacid caps? Initial call taken by: Harold Barban,  May 11, 2010 8:34 AM  Follow-up for Phone Call        yes , same dose and instructions Jose E. Paz MD  May 11, 2010 9:34 AM     New/Updated Medications: PREVACID 30 MG CPDR (LANSOPRAZOLE) DISSOLVE 1 TAB IN MOUTH two times a day Prescriptions: PREVACID 30 MG CPDR (LANSOPRAZOLE) DISSOLVE 1 TAB IN MOUTH two times a day  #60 x 3   Entered by:   Army Fossa CMA   Authorized by:   Nolon Rod. Paz MD   Signed by:   Army Fossa CMA on 05/11/2010   Method used:   Electronically to        [facility] (retail)       [address]       [place], [place]  [address]       Ph: [phone] or [phone]       Fax: [phone]   RxID:   [id number]

## 2010-05-28 NOTE — Letter (Signed)
[facility]: [facility]   Imported By: Maryln Gottron 05/18/2010 15:11:20  _____________________________________________________________________  External Attachment:    Type:   Image     Comment:   External Document

## 2010-07-01 LAB — COMPREHENSIVE METABOLIC PANEL
AST: 22 U/L (ref 0–37)
Alkaline Phosphatase: 77 U/L (ref 39–117)
CO2: 30 mEq/L (ref 19–32)
Calcium: 9.4 mg/dL (ref 8.4–10.5)
Chloride: 102 mEq/L (ref 96–112)
GFR calc non Af Amer: >60 mL/min (ref >60)
Glucose, Bld: 103 mg/dL — ABNORMAL HIGH (ref 70–99)
Sodium: 140 mEq/L (ref 135–145)
Total Protein: 6.5 g/dL (ref 6.0–8.3)

## 2010-07-01 LAB — CBC
HCT: 36 % (ref 36.0–46.0)
HCT: 35.6 % — ABNORMAL LOW (ref 36.0–46.0)
Hemoglobin: 11.6 g/dL — ABNORMAL LOW (ref 12.0–15.0)
MCHC: 32.5 g/dL (ref 30.0–36.0)
RBC: 4.29 MIL/uL (ref 3.87–5.11)
RBC: 4.28 MIL/uL (ref 3.87–5.11)
WBC: 6.1 10*3/uL (ref 4.0–10.5)
WBC: 6.3 10*3/uL (ref 4.0–10.5)

## 2010-07-01 LAB — MAGNESIUM
Magnesium: 2 mg/dL (ref 1.5–2.5)
Magnesium: 2 mg/dL (ref 1.5–2.5)

## 2010-07-01 LAB — DIFFERENTIAL
Basophils Absolute: 0.1 10*3/uL (ref 0.0–0.1)
Eosinophils Relative: 2 % (ref 0–5)
Lymphocytes Relative: 38 % (ref 12–46)
Lymphocytes Relative: 20 % (ref 12–46)
Lymphs Abs: 2.3 10*3/uL (ref 0.7–4.0)
Lymphs Abs: 1.3 10*3/uL (ref 0.7–4.0)
Monocytes Absolute: 0.4 10*3/uL (ref 0.1–1.0)
Monocytes Absolute: 0.4 10*3/uL (ref 0.1–1.0)
Monocytes Relative: 7 % (ref 3–12)
Neutro Abs: 3.1 10*3/uL (ref 1.7–7.7)
Neutrophils Relative %: 51 % (ref 43–77)

## 2010-07-01 LAB — POCT CARDIAC MARKERS: Troponin i, poc: <0.05 ng/mL (ref 0.00–0.09)

## 2010-07-01 LAB — LIPID PANEL
Cholesterol: 176 mg/dL (ref 0–200)
Total CHOL/HDL Ratio: 3.7 RATIO

## 2010-07-01 LAB — BASIC METABOLIC PANEL
GFR calc non Af Amer: >60 mL/min (ref >60)
GFR calc non Af Amer: >60 mL/min (ref >60)
Glucose, Bld: 128 mg/dL — ABNORMAL HIGH (ref 70–99)
Glucose, Bld: 80 mg/dL (ref 70–99)
Potassium: 3.7 mEq/L (ref 3.5–5.1)
Potassium: 3.3 mEq/L — ABNORMAL LOW (ref 3.5–5.1)
Sodium: 141 mEq/L (ref 135–145)
Sodium: 144 mEq/L (ref 135–145)

## 2010-07-01 LAB — HOMOCYSTEINE: Homocysteine: 7.9 umol/L (ref 4.0–15.4)

## 2010-07-08 ENCOUNTER — Other Ambulatory Visit: Payer: Self-pay | Admitting: Internal Medicine

## 2010-07-08 MED ORDER — BENAZEPRIL HCL 10 MG PO TABS: 10.0000 mg | ORAL_TABLET | Freq: Every day | ORAL | Status: DC

## 2010-07-27 LAB — COMPREHENSIVE METABOLIC PANEL
ALT: 28 U/L (ref 0–35)
AST: 24 U/L (ref 0–37)
AST: 32 U/L (ref 0–37)
Alkaline Phosphatase: 64 U/L (ref 39–117)
CO2: 32 mEq/L (ref 19–32)
Calcium: 9.9 mg/dL (ref 8.4–10.5)
Chloride: 104 mEq/L (ref 96–112)
Creatinine, Ser: 0.94 mg/dL (ref 0.4–1.2)
Creatinine, Ser: 0.9 mg/dL (ref 0.4–1.2)
GFR calc Af Amer: >60 mL/min (ref >60)
GFR calc Af Amer: >60 mL/min (ref >60)
GFR calc non Af Amer: >60 mL/min (ref >60)
Potassium: 3.6 mEq/L (ref 3.5–5.1)
Sodium: 139 mEq/L (ref 135–145)
Total Bilirubin: 0.6 mg/dL (ref 0.3–1.2)
Total Protein: 7.8 g/dL (ref 6.0–8.3)

## 2010-07-27 LAB — CBC
HCT: 35.4 % — ABNORMAL LOW (ref 36.0–46.0)
MCHC: 32.7 g/dL (ref 30.0–36.0)
MCV: 84.4 fL (ref 78.0–100.0)
MCV: 83.4 fL (ref 78.0–100.0)
Platelets: 154 10*3/uL (ref 150–400)
RBC: 4.2 MIL/uL (ref 3.87–5.11)
RBC: 4.55 MIL/uL (ref 3.87–5.11)
RDW: 13.7 % (ref 11.5–15.5)
WBC: 5.9 10*3/uL (ref 4.0–10.5)

## 2010-07-27 LAB — POCT CARDIAC MARKERS
Myoglobin, poc: 61.4 ng/mL (ref 12–200)
Myoglobin, poc: 65 ng/mL (ref 12–200)
Troponin i, poc: <0.05 ng/mL (ref 0.00–0.09)
Troponin i, poc: <0.05 ng/mL (ref 0.00–0.09)
Troponin i, poc: <0.05 ng/mL (ref 0.00–0.09)

## 2010-07-27 LAB — URINALYSIS, ROUTINE W REFLEX MICROSCOPIC
Bilirubin Urine: NEGATIVE
Glucose, UA: NEGATIVE mg/dL
Ketones, ur: NEGATIVE mg/dL
Protein, ur: NEGATIVE mg/dL
pH: 7 (ref 5.0–8.0)

## 2010-07-27 LAB — LIPID PANEL
Cholesterol: 172 mg/dL (ref 0–200)
HDL: 49 mg/dL (ref >39)
Triglycerides: 74 mg/dL (ref <150)

## 2010-07-27 LAB — DIFFERENTIAL
Eosinophils Absolute: 0.1 10*3/uL (ref 0.0–0.7)
Eosinophils Relative: 1 % (ref 0–5)
Lymphocytes Relative: 25 % (ref 12–46)
Lymphs Abs: 1.7 10*3/uL (ref 0.7–4.0)
Monocytes Relative: 6 % (ref 3–12)

## 2010-07-27 LAB — URINE CULTURE: Colony Count: 15000

## 2010-07-27 LAB — CARDIAC PANEL(CRET KIN+CKTOT+MB+TROPI)
CK, MB: 1.6 ng/mL (ref 0.3–4.0)
CK, MB: 1.8 ng/mL (ref 0.3–4.0)
Relative Index: 1.3 (ref 0.0–2.5)
Total CK: 123 U/L (ref 7–177)
Total CK: 134 U/L (ref 7–177)
Troponin I: <0.01 ng/mL (ref 0.00–0.06)

## 2010-07-27 LAB — TSH: TSH: 2.636 u[IU]/mL (ref 0.350–4.500)

## 2010-07-27 LAB — URINE MICROSCOPIC-ADD ON

## 2010-08-10 ENCOUNTER — Other Ambulatory Visit: Payer: Self-pay | Admitting: Obstetrics and Gynecology

## 2010-08-10 DIAGNOSIS — Z1231 Encounter for screening mammogram for malignant neoplasm of breast: Secondary | ICD-10-CM

## 2010-08-12 ENCOUNTER — Encounter: Payer: Self-pay | Admitting: Internal Medicine

## 2010-08-12 ENCOUNTER — Ambulatory Visit (INDEPENDENT_AMBULATORY_CARE_PROVIDER_SITE_OTHER): Payer: BC Managed Care – PPO | Admitting: Internal Medicine

## 2010-08-12 VITALS — BP 122/80 | HR 71 | Wt 167.6 lb

## 2010-08-12 DIAGNOSIS — J309 Allergic rhinitis, unspecified: Secondary | ICD-10-CM

## 2010-08-12 DIAGNOSIS — R002 Palpitations: Secondary | ICD-10-CM | POA: Insufficient documentation

## 2010-08-12 MED ORDER — FLUTICASONE PROPIONATE 50 MCG/ACT NA SUSP: 2.0000 | Freq: Every day | NASAL | Status: DC

## 2010-08-12 NOTE — Assessment & Plan Note (Signed)
See above

## 2010-08-12 NOTE — Assessment & Plan Note (Signed)
Se above

## 2010-08-12 NOTE — Progress Notes (Signed)
  Subjective:    Patient ID: Leah Cameron, female    DOB: 12-15-1950, 60 y.o.   MRN: [id number]  HPI Several issues. 3 weeks history of sinus congestion, nasal discharge. Taking Claritin over-the-counter. Is not using decongestants. 3 days ago, the left side of her neck looked swollen, now is back to normal . 2 weeks history of palpitations described as "heart going fast"  on and off. She had a total of 2 or 3 episodes, they last a few seconds. Episodes are at rest.  Past Medical History  Diagnosis Date  . GERD (gastroesophageal reflux disease)   . Hypertension   . Allergic rhinitis   . RLS (restless legs syndrome)   . Thyromegaly   . Back pain, chronic   . Stricture esophagus     last EGD and dilatation 3/09  . Fibromyalgia     sees Dr.Davenshwar  . Menopause     on lexapro was started by gyn  . Syncope 4/11    admitted thought to be due to over treatment of hypertension   Past Surgical History  Procedure Date  . Breast lumpectomy 2000  . Knee surgery 2002    right  . Knee arthroscopy 2003    right  . Shoulder surgery 2004    Review of Systems Denies any fever; she did have mild cough without wheezing. She also has mild on and off shortness of breath. Denies chest pain. She did take a vacation 2 weeks ago, denies lower extremity edema, she has pain on and off on her legs (nothing new) Denies itchy eyes, itchy nose or sneezing    Objective:   Physical Exam  Constitutional: She is oriented to person, place, and time. She appears well-developed and well-nourished.  HENT:  Head: Normocephalic and atraumatic.  Right Ear: External ear normal.  Left Ear: External ear normal.  Nose: Nose normal.  Mouth/Throat: No oropharyngeal exudate.  Eyes: No scleral icterus.       Not pale  Neck:       Symmetric thyromegaly without nodularities or tenderness. Otherwise the neck is symmetric, no mass or lymphadenopathy noted.  Cardiovascular: Normal rate, regular rhythm and  normal heart sounds.   No murmur heard. Pulmonary/Chest: Effort normal and breath sounds normal. No respiratory distress. She has no wheezes. She has no rales.  Musculoskeletal:       Extremities with symmetric and not tender to palpation calves  Neurological: She is alert and oriented to person, place, and time.          Assessment & Plan:  Present today with multiple symptoms. Nasal congestion, most likely from allergies. Continue with Claritin, Flonase. Palpitations: 3 episodes of palpitations in the last 2 weeks, they last a few seconds. Mild shortness of breath. On exam there is no calf swelling or tenderness. EKG today show a rate of 62, no acute changes. Recommend observation, will call if symptoms increase. Symmetric neck? Neck examination today normal. Recommend observation

## 2010-08-12 NOTE — Patient Instructions (Signed)
Continue with Claritin for allergies and add Flonase 2 sprays on each side of the nose daily. Call if you have increased palpitations, shortness of breath, chest pain. You are due for a physical exam, please schedule at your earliest convenience

## 2010-08-13 ENCOUNTER — Telehealth: Payer: Self-pay | Admitting: *Deleted

## 2010-08-13 ENCOUNTER — Ambulatory Visit
Admission: RE | Admit: 2010-08-13 | Discharge: 2010-08-13 | Disposition: A | Discharge status: Home / Self Care | Payer: BC Managed Care – PPO | Source: Ambulatory Visit | Attending: Obstetrics and Gynecology | Admitting: Obstetrics and Gynecology

## 2010-08-13 DIAGNOSIS — Z1231 Encounter for screening mammogram for malignant neoplasm of breast: Secondary | ICD-10-CM

## 2010-08-13 NOTE — Telephone Encounter (Signed)
Message copied by Army Fossa on Thu Aug 13, 2010  3:15 PM ------      Message from: Leah Cameron      Created: Wed Aug 12, 2010  5:22 PM       Check on her, seen w/ allergies , palpitations.      Better?

## 2010-08-13 NOTE — Telephone Encounter (Signed)
Message left for patient to return my call.  

## 2010-08-14 NOTE — Telephone Encounter (Signed)
I spoke w/ pt she states she has not had any problems since she has been in.

## 2010-08-25 NOTE — Discharge Summary (Signed)
Leah Cameron, Leah Cameron           ACCOUNT NO.:  [id number]   MEDICAL RECORD NO.:  [id number]          PATIENT TYPE:  INP   LOCATION:  1434                         FACILITY:  [facility]   PHYSICIAN:  Willow Ora, MD           DATE OF BIRTH:  [birth year]   DATE OF ADMISSION:  04/16/2008  DATE OF DISCHARGE:  04/18/2008                               DISCHARGE SUMMARY   ADMITTING DIAGNOSIS:  Atypical chest pain.   DISCHARGE DIAGNOSIS:  Atypical chest pain.   BRIEF HISTORY AND PHYSICAL:  Leah Cameron is a 60 year old lady with  history of RLS, chronic back pain, esophageal stricture status post EGD  multiple times, the last one being March 2009, fibromyalgia, and  negative a Cardiolite in February 2006 who was admitted with atypical  chest pain.  Upon admission, her temperature was 98.2, blood pressure  106/63, heart rate 68, respirations 16, O2 sat 96% on room air.  Lungs  were clear to auscultation bilaterally.  Cardiovascular, regular rate  and rhythm without murmur.  Abdomen was soft, nontender, and not  distended.  Lower extremities showed no clubbing, cyanosis, or edema.   LABORATORY AND X-RAYS:  EKG during this admission was unchanged from old  EKGs.  Multiple sets of cardiac enzymes were negative.  BMP was normal.  LFTs were normal.  A1c was 5.8.  TSH was normal.  Hemoglobin was  initially 12.4 and repeated hemoglobin was 11.4 noting that her baseline  hemoglobin was 12.3 last year.   HOSPITAL COURSE:  The patient was admitted to the hospital.  She ruled  out for MI.  On the day of discharge, the pain was much better.  She did  have persistent very mild tightness in the anterior chest which was  worse after she ate breakfast.  On exam, the pain was somehow  reproducible with pressure in the chest wall.  At this point, her pain  is quite atypical and she has ruled out for MI and I think it is  reasonable to discharge her home.  We noted that her A1c was 5.8 and  this will be addressed  as an outpatient.   She will be discharged with the following instructions:  1. Continue with all home medications.  2. Ambien 10 mg at night.  3. Calcium once daily.  4. Hydrochlorothiazide 25 mg 1 p.o. daily.  5. Lexapro 10 mg 1 p.o. daily.  6. Lotensin 10 mg 1 p.o. daily.  7. Multivitamin.  8. Norvasc 10 mg.  9. Prevacid 30 mg twice a day.  10.Patient will be scheduled for a stress test as an outpatient, as      well as a gallbladder ultrasound.  11.Follow up with Dr. Drue Novel in 2 weeks, my office will schedule that.  12.Patient is advised to go to the emergency room if she is worse.  13.Hemoglobin A1c of 5.8 will be addressed as an outpatient.      Willow Ora, MD  Electronically Signed     JP/MEDQ  D:  04/18/2008  T:  04/18/2008  Job:  [id number]

## 2010-08-25 NOTE — H&P (Signed)
Leah Cameron, Leah Cameron           ACCOUNT NO.:  [id number]   MEDICAL RECORD NO.:  [id number]          PATIENT TYPE:  INP   LOCATION:  1434                         [facility]ACILITY[facility]  [facility]   PHYSICIAN:  Michiel Cowboy, MDDATE OF BIRTH:  1951/01/31   DATE OF ADMISSION:  04/16/2008  DATE OF DISCHARGE:                              HISTORY & PHYSICAL   PRIMARY CARE Arwin Bisceglia:  Dr. Drue Novel with [facility].   CHIEF COMPLAINT:  Chest pain.   The patient is as 60 year old female with no past medical history of  tobacco abuse but history of hypertension who was at her baseline of  health until she was driving today and then noticed that she was not  feeling very well, just overall generally with malaise, slightly  lightheaded, slightly nauseous.  She then noticed that she was having  some chest pain which was substernal, this lasted for many hours until  she went to the emergency department where her EKG was within normal  limits.  Cardiac markers, I-STATs were negative at which point [facility] was called.  The patient was transferred from [facility] ED  to [facility].  At this point the patient denies any chest pain and she  says her symptoms have resolved.  She does endorse that during her  episode of chest pain she did have some shortness of breath.  Otherwise,  review of systems only significant for generalized malaise and not  feeling well, as well as headache that has been associated with chest  pain.  Otherwise, chest pain was nonradiational, pressure-like, over  small area of her chest.  The rest of the review of systems negative, no  chills, no fevers, no constipation, no diarrhea, , slight nausea, no  vomiting.   PAST MEDICAL HISTORY:  Significant for:  1. History of chest pain secondary to GERD in the past.  2. History of recurrent esophageal stricture.  3. Hypertension.  4. History of anxiety.   SOCIAL HISTORY:  The patient denies alcohol or drug abuse.  Does not  smoke.   The patient reports that she does not have a lot of exercise.   FAMILY HISTORY:  Significant for grandfather with coronary artery  disease in his 80s but otherwise unremarkable.   VITALS SIGNS:  Temperature 98.2, blood pressure 106/63, heart rate 68,  respirations 16, sat 96% on room air.  The patient appears to be  currently somewhat sleepy after receiving Ambien prior to bedtime but no  acute distress.  HEAD:  Nontraumatic.  Somewhat dryish mucous membranes but normal skin  turgor.  LUNGS:  Clear to auscultation bilaterally.  HEART:  Regular rate and rhythm, no murmurs, rubs or gallops.  ABDOMEN:  Soft, nontender, nondistended.  LOWER EXTREMITIES: Without clubbing, cyanosis or edema.  NEUROLOGIC: Intact.   LABS:  White blood cell count 6.8, hemoglobin 12.4.  Sodium 139,  potassium 3.5, creatinine 0.9.  LFTs are within normal limits.  EKG  showing T-wave inversion in lead V1, otherwise no flattening of T waves  but otherwise unremarkable.  CT scan of the head negative.  Chest x-ray  showing atelectasis.  D-dimer 0.25.  ASSESSMENT AND PLAN:  1. Chest pain:  The patient does have some risk factors but also has      severe GERD.  We will cycle cardiac enzymes x3, obtain serial EKGs.      The patient at some point will probably benefit from a stress test      as outpatient probably would be prudent.  Will check fasting lipid      panel and hemoglobin A1c, check TSH.  2. History of gastroesophageal reflux disease:  The patient for some      reason only supposed to take Prilosec, will continue that.      Currently chest pain resolved.  The patient in the past supposedly      may have required upper endoscopy and seen by Dr. Melvia Heaps in      March.  May need to have follow-up appointment with GI if the      symptoms persist.  3. Prophylaxis:  SCDs and Prilosec.   __________      Michiel Cowboy, MD  Electronically Signed     AVD/MEDQ  D:  04/17/2008  T:  04/17/2008   Job:  [id number]   cc:   Willow Ora, MD  [phone] [address] [place], [place] [address]

## 2010-08-25 NOTE — Assessment & Plan Note (Signed)
[facility]                         GASTROENTEROLOGY OFFICE NOTE   NAME:Leah Cameron, Leah Cameron                  MRN:          [id number]  DATE:06/20/2007                            DOB:          [birth year]    PROBLEM:  Chest pain and burning.   REASON:  Ms. Dreier has returned complaining of the above.  She has  had severe pyrosis throughout the day and at night.  She will waken with  discomfort between her shoulder blades and in her anterior chest.  She  has had some dysphagia to solids and liquids.  She remains on Protonix  40 mg a day.  She takes occasional Naprosyn.  She has had no recent  antibiotics.  She has a history of esophageal stricture that was dilated  in September 2006.   Other medications include Ambien, Lotensin HCTZ, Lexapro, Norvasc, baby  aspirin and Lidoderm pain patch.   PHYSICAL EXAMINATION:  Pulse 68, blood pressure 108/68.  Weight 159.  HEENT: EOMI.  PERRLA.  Sclerae are anicteric.  Conjunctivae are pink.  NECK:  Supple without thyromegaly, adenopathy or carotid bruits.  CHEST:  Clear to auscultation and percussion without adventitious  sounds.  CARDIAC:  Regular rhythm; normal S1 S2.  There are no murmurs, gallops  or rubs.  ABDOMEN:  Bowel sounds are normoactive.  Abdomen is soft, nontender and  nondistended.  There are no abdominal masses, tenderness, splenic  enlargement or hepatomegaly.  EXTREMITIES:  Full range of motion.  No cyanosis, clubbing or edema.  RECTAL:  Deferred.   IMPRESSION:  1. Chest pain secondary to gastroesophageal reflux disease despite      proton pump inhibitor therapy.  Opportunistic infection of the      esophagus is unlikely in the absence of antibiotics.  She has no      history of diabetes.  NSAIDs may be contributing.  2. Dysphagia - Likely secondary to her current esophageal stricture.   RECOMMENDATION:  1. Anti-reflux diet.  2. Trial of Capadex 60 mg daily.  3. Upper endoscopy with  dilatation as indicated.     Barbette Hair. Arlyce Dice, MD,FACG  Electronically Signed    RDK/MedQ  DD: 06/20/2007  DT: 06/20/2007  Job #: [id number]   cc:   Willow Ora, MD

## 2010-08-28 NOTE — H&P (Signed)
[facility]  Patient:    Leah Cameron, Leah Cameron                    MRN: [id number] Adm. Date:  08/29/00 Attending:  Claretta Fraise, M.D.                         History and Physical  HISTORY OF PRESENT ILLNESS:  This is a 60 year old African-American female with history of hypertension, whose primary care physician is Frederico Hamman, M.D., who was being seen in the clinic by the nurse for checking of blood pressure.  She at that time complained of some chest tightness and her blood pressure was noted to be 138/96.  She was subsequently seen by me.  The patient tells me that she started having some chest pain on Saturday.  She had just woke up with it and had a sense of tightness that lasted about an hour. She denies any shortness of breath, diaphoresis, nausea or vomiting with it. She fell asleep and she felt better.  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  She had no associated symptoms once again.  She had gone to see her school nurse and her blood pressure was noted to be 158/100 on the right and on the left was 156/100.  Currently she still complains of mild sense of tightness.  She has not checked any of her ambulatory blood pressures.  I had just seen her last week for the first time to do her medication refill.  At that time she was asymptomatic.  Her cardiac risk factor is primarily for hypertension.  She is suppose to have a lipid panel done, but had not set up an appointment for this just as yet.  I had ordered this last week.  Her last one done in 1999 showed a total cholesterol of 176, triglyceride 114, HDL 58, LDL 95.  She is not a diabetic, she has never smoked.  There is a family history of myocardial infarction in the maternal grandmother in his 63s who died at that age.  ALLERGIES:  PENICILLIN, PARAFON FORTE, and FLEXERIL.  CURRENT  MEDICATIONS:  Norvasc 5 mg p.o. q.d. and birth control pills.  I had recommended that she take an aspirin a day, but she has not started yet either.  PAST MEDICAL HISTORY:  Hypertension.  PAST SURGICAL HISTORY:  She has had left knee surgery done for debridement and bunionectomy done on her foot.  SOCIAL HISTORY:  She is married, she does exercise by walking about a mile or two three or times a day.  She is a nonsmoker, nondrinker.  FAMILY HISTORY:  Positive for hypertension in the parents and sister and some kind of arthritis in the mother.  There is myocardial infarction in the maternal grandmother.  REVIEW OF SYSTEMS:  As per HPI.  She denies any melena, hematochezia.  She is currently finishing up on her period, so she has had some hematuria as a result.  PHYSICAL EXAMINATION:  VITAL SIGNS:  Initial blood pressure 138/96 and her repeat was 120/86.  GENERAL:  She is in no acute distress.  HEENT:  Fairly unremarkable.  NECK:  No JVD.  LUNGS:  Clear to auscultation bilaterally without any crackles or wheezes.  HEART:  Regular rate and rhythm.  She has no pretibial edema.  ABDOMEN:  Bowel sounds are  normal, soft, and nontender.  X-RAY:  EKG showed a mild ST depression in the inferior leads, but otherwise no other changes were noted.  I do not have an old EKG to compare with.  ASSESSMENT/PLAN:  Angina or angina equivalent with either a strain pattern on the EKG or maybe some ischemic changes.  Will go ahead and admit her and rule out her out for an myocardial infarction.  Will go ahead and start her on nitroglycerin drip.  She was given nitroglycerin sublingually x 2 without much significant relief.  I went ahead and started her on oxygen and aspirin.  The patient will be started on heparin protocol.  I have held back on using a beta-blocker since her heart rate was 61.  Will go ahead and start her on Lipitor 10 mg p.o. q.d. until her fasting lipid panels come back saying  either direction.  I will continue her on Norvasc.  Dr. Darryll Capers, the doctor on-call, was notified of the patients admission, and he will be called on her CPK with MB and troponin.  In addition to that she, will have a basic CBC, BMP, UA, PT/PTT done also.  The CK with MB and troponin will be done now and q.6. DD:  08/29/00 TD:  08/30/00 Job: [id number]

## 2010-08-28 NOTE — Op Note (Signed)
NAME:  Leah Cameron, Leah Cameron                     ACCOUNT NO.:  [id number]   MEDICAL RECORD NO.:  [id number]                   PATIENT TYPE:  AMB   LOCATION:  DSC                                  FACILITY:  [facility]   PHYSICIAN:  Lubertha Basque. Jerl Santos, M.D.             DATE OF BIRTH:  1950-05-22   DATE OF PROCEDURE:  02/06/2002  DATE OF DISCHARGE:                                 OPERATIVE REPORT   PREOPERATIVE DIAGNOSES:  1. Left knee degenerative joint disease.  2. Right knee degenerative joint disease.   POSTOPERATIVE DIAGNOSES:  1. Left knee degenerative joint disease.  2. Right knee degenerative joint disease.   OPERATION PERFORMED:  1. Left knee arthroscopic partial lateral meniscectomy.  2. Left knee arthroscopic chondroplasty, all three compartments.  3. Right knee arthrocentesis.   SURGEON:  Lubertha Basque. Jerl Santos, M.D.   ASSISTANT:  Prince Rome, P.A.   ANESTHESIA:  General.   INDICATIONS FOR PROCEDURE:  The patient is a 60 year old woman with a long  history of bilateral knee pain.  She is three years out from an arthroscopy  of the left knee and again had pain and an effusion which does not seem to  respond to various oral medications or injections.  She is offered a repeat  arthroscopy.  The procedure was discussed with the patient and informed  operative consent was obtained after discussion of possible complications of  reaction to anesthesia and infection.   DESCRIPTION OF PROCEDURE:  The patient was taken to the operating suite  where general anesthetic was applied without difficulty.  The patient was  positioned supine and prepped and draped in the normal sterile fashion.  The  patient also wished to have her right knee aspirated at the same sitting  under anesthesia and this was accomplished after sterile prep with Betadine  and alcohol.  We removed about 20 cc of normal-appearing joint fluid and  placed in Depo-Medrol with lidocaine.  A band-aid was applied  there.  Attention was then turned toward the left knee.  Through two old inferior  portals an arthroscopy was performed.  The suprapatellar pouch was benign  except for cartilaginous loose bodies which were removed.  A thorough  irrigation of the joint was performed.  The patellofemoral joint exhibited  some grade 3 changes especially in the groove and on the lateral aspect of  the patella.  A thorough chondroplasty was done of this portion of her knee.  In the medial compartment, she had frank breakdown of the femoral cartilage  with grade 4 change in several locations.  Large loose flaps of articular  cartilage were removed.  The meniscus itself appeared benign.  The ACL and  PCL were intact in the notch.  In the lateral compartment she had grade 4  change on the lateral tibial plateau and a small middle horn lateral  meniscus with tear addressed with 5% partial lateral meniscectomy.  The knee  was thoroughly irrigated at the end of the case followed by placement of  Marcaine with epinephrine and morphine plus Depo-Medrol.  Adaptic was placed  on the portals followed by dry gauze and a loose Ace wrap.  Estimated blood  loss and intraoperative fluids as can be obtained from anesthesia records.   DISPOSITION:  The patient was extubated in the operating room and taken to  the recovery room in stable condition.  Plans were for the patient  to go  home the same day and to follow up in the office in less than a week.  I  will contact her by phone tonight.                                                 Lubertha Basque Jerl Santos, M.D.    PGD/MEDQ  D:  02/06/2002  T:  02/06/2002  Job:  [id number]

## 2010-08-28 NOTE — Op Note (Signed)
[place]  Patient:    Leah Cameron, Leah Cameron Visit Number: [id number] MRN: [id number]          Service Type: DSU Location: RCRM 2550 08 Attending Physician:  Marcene Corning Dictated by:   Lubertha Basque. Jerl Santos, M.D. Proc. Date: 01/10/01 Admit Date:  01/10/2001                             Operative Report  PREOPERATIVE DIAGNOSIS: 1. Right knee chondromalacia. 2. Right knee loose bodies.  POSTOPERATIVE DIAGNOSIS: 1. Right knee chondromalacia, patella and lateral tibial plateau. 2. Right knee loose bodies. 3. Right knee torn lateral meniscus.  OPERATION PERFORMED: 1. Right knee thorough chondroplasty. 2. Right knee partial lateral meniscectomy. 3. Right knee removal of loose bodies.  ANESTHESIA:  General.  ATTENDING SURGEON:  Lubertha Basque. Jerl Santos, M.D.  ASSISTANT:  Lindwood Qua, P.A.  INDICATIONS FOR PROCEDURE:  The patient is a 60 year old woman with a very long history of right knee difficulty including pain and swelling.  This has persisted despite oral anti-inflammatories, brace, activity restriction, and aspiration and injection x 2.  At this point she was offered operative intervention to consist of an arthroscopy.  She underwent a similar procedure on the opposite knee two and a half years ago and has done well.  The procedure was discussed with the patient and informed operative consent was obtained after discussion of possible complications of reaction to anesthesia and infection.  DESCRIPTION OF PROCEDURE:  The patient was taken to an operating suite where general anesthetic was applied without difficulty.  She was then positioned supine and prepped and draped in normal sterile fashion.  After administration of preop IV antibiotics, an arthroscopy of the right knee was performed through a total of two portals.  The suprapatellar pouch was benign while patellofemoral joint showed some grade 3 degeneration especially in the  notch. The joint actually tracked well and no lateral release was required.  A thorough chondroplasty was done mostly at the intertrochlear groove where she had a large area of grade 3 change.  Large flaps of cartilage were removed. She also had several large flaps loose inside the knee which were removed. The medial compartment showed some mild chondromalacia towards the notch and this was addressed with a chondroplasty.  The meniscus itself was intact. Again, some cartilaginous loose bodies were stuck in this compartment and were removed.  In the lateral compartment, she had some grade 3 change on the focal area of the lateral tibial plateau and a chondroplasty was performed here. Some significant loose bodies were also removed from the popliteal space.  She had a tiny tear of the lateral meniscus which was addressed with a partial lateral meniscectomy taking a tiny portion of the structure back to a stable rim. The knee was thoroughly irrigated at the end of the case followed by placement of Marcaine with epinephrine and morphine.  Adaptic was placed over the portals followed by dry gauze and a loose Ace wrap.  Estimated blood loss and intraoperative fluids can be obtained from Anesthesia records.  DISPOSITION:  The patient was extubated in the operating room and taken to the recovery room in stable condition.  Plans were for her to stay overnight for monitoring of her cardiac situation with probable discharge home in the morning. Dictated by:   Lubertha Basque Jerl Santos, M.D. Attending Physician:  Marcene Corning DD:  01/10/01 TD:  01/10/01 Job: [id number]

## 2010-08-28 NOTE — Op Note (Signed)
NAME:  Leah Cameron, Leah Cameron                     ACCOUNT NO.:  [id number]   MEDICAL RECORD NO.:  [id number]                   PATIENT TYPE:  AMB   LOCATION:  DSC                                  FACILITY:  [facility]   PHYSICIAN:  Lubertha Basque. Jerl Santos, M.D.             DATE OF BIRTH:  01-Apr-1951   DATE OF PROCEDURE:  06/26/2002  DATE OF DISCHARGE:                                 OPERATIVE REPORT   PREOPERATIVE DIAGNOSES:  1. Right shoulder impingement.  2. Right shoulder adhesive capsulitis.   POSTOPERATIVE DIAGNOSES:  1. Right shoulder impingement.  2. Right shoulder adhesive capsulitis.   OPERATION PERFORMED:  1. Right shoulder controlled manipulation.  2. Right shoulder arthroscopic acromioplasty.  3. Right shoulder partial claviculectomy.   SURGEON:  Lubertha Basque. Jerl Santos, M.D.   ASSISTANT:  Prince Rome, P.A.   ANESTHESIA:   INDICATIONS FOR PROCEDURE:  The patient is a 60 year old woman with a many  month history of a very painful and stiff right shoulder.  This has  persisted despite oral anti-inflammatories, injection, and aggressive  physical therapy.  She is felt to have adhesive capsulitis but also has a  history of impingement and x-rays showing a type 3A acromion.  Planned  procedure at this point is for a controlled manipulation with arthroscopic  release of adhesions and acromioplasty.  The procedures were discussed with  the patient and informed operative consent was obtained after discussion of  possible complications of reaction to anesthesia and infection.   DESCRIPTION OF PROCEDURE:  The patient was taken to the operating suite  where general anesthetic was applied after a block was given in the  anesthesia area.  She was positioned in a beach chair position and prepped  and draped in the normal sterile fashion.  After administration of preop  intravenous antibiotics, the shoulder was thoroughly examined.  We took her  forward flexion from 80 or 90 degrees  up to 150 with several audible pops  felt along the way.  We also gradually increased her external rotation from  about 10 to 70 degrees in a similar fashion.  A portion of this was done  with the scope in place.  She had some adhesions from the anterior aspect of  the glenohumeral joint which were released.  These were mostly around the  subscapularis tendon.  The biceps anchor appeared intact and there were no  significant degenerative changes.  The biceps tendon appeared intact  throughout the shoulder as it exited through the cuff.  The rotator cuff  appeared relatively benign with perhaps a small partial thickness tear near  the interval.  In the subacromial space, she had an abundance of bursitis  which was removed.  There were also some adhesions between the rotator cuff  and the acromion which we excised.  The coracoacromial ligament was released  but not resected.  A thorough acromioplasty was done with the bur in the  lateral  position followed by transfer of the bur to the posterior position.  She also had a prominence under her distal clavicle which we excised without  a formal AC decompression.  The cuff was thoroughly examined from above and  no tear could be seen from this perspective.  The shoulder was thoroughly  irrigated at the end of the case followed by placement of Marcaine with  epinephrine.  Simple sutures of nylon were used to loosely reapproximate the  portals followed by Adaptic and a dry gauze dressing with tape.  At the end  of the case, her motion was forward flexion to 150 with external rotation of  70.   DISPOSITION:  The patient was taken to the recovery room in stable  condition.  Plans were for the patient  to go home the same day and to  follow up in the office in less than a week.  I will contact her by phone  tonight.                                                 Lubertha Basque Jerl Santos, M.D.    PGD/MEDQ  D:  06/26/2002  T:  06/26/2002  Job:   [id number]

## 2010-08-28 NOTE — Discharge Summary (Signed)
[facility]  Patient:    Leah Cameron, Leah Cameron                  MRN: [id number] Adm. Date:  [id number] Disc. Date: [id number] Attending:  Dorena Cookey CC:         Dellia Nims, M.D., [facility], [place] [facility]   Discharge Summary  DISCHARGE MEDICATIONS: 1. Toprol XL 25 mg 1 once a day. 2. Norvasc 2.5 mg 1 once a day.  ACTIVITY:  As tolerated.  DIET:  Resume previous.  SPECIAL INSTRUCTIONS:  To work on Monday if okay.  Follow-up appointment with Dr. Baldo Daub in one week.  Call if problems.  HISTORY:  The patient is a 60 year old female who was admitted by Dr. Baldo Daub for atypical chest pain, to rule out MI.  For the details, please address her history and physical.  HOSPITAL COURSE:  During the course of the hospitalization, the patient was monitored on telemetry.  Myocardial infarction was ruled out.  Her tests came back normal.  She was chest pain free.  She denied indigestion.  No exertional symptoms previously.  She admits to being under stress over the past several days due to the death of their friends son in a car accident.  PHYSICAL EXAMINATION:  GENERAL:  On the day of discharge, she is feeling well.  She is in no acute distress.  There is no chest pain.  HEENT:  Within moist mucosa.  NECK:  Supple.  LUNGS:  Clear to auscultation and percussion.  HEART:  S1, S2, no gallop.  ABDOMEN:  Soft, nontender.  EXTREMITIES:  Lower extremities without edema.  Calves are nontender.  VITAL SIGNS:  Temperature 98.1, heart rate 62, blood pressure 133/67, respirations 20, O2 saturations 98% on room air.  LABORATORIES:  Chest x-ray was within normal limits, thoracolumbar scoliosis. EKG with normal sinus rhythm.  CKs normal.  Troponins normal.  Urinalysis normal.  CBC with white count 7.0, hemoglobin 12.3, sodium 138, potassium 3.6, Total cholesterol 140, triglycerides 61, HDL 59, LDL 69. DD:  08/31/00 TD:  08/31/00 Job:  [id number]

## 2010-10-10 ENCOUNTER — Other Ambulatory Visit: Payer: Self-pay | Admitting: Internal Medicine

## 2011-01-12 ENCOUNTER — Other Ambulatory Visit: Payer: Self-pay | Admitting: Internal Medicine

## 2011-01-12 MED ORDER — BENAZEPRIL HCL 10 MG PO TABS: 10.0000 mg | ORAL_TABLET | Freq: Every day | ORAL | Status: DC

## 2011-01-12 NOTE — Telephone Encounter (Signed)
Please send this medication into [facility] [id number]

## 2011-01-18 ENCOUNTER — Ambulatory Visit: Payer: BC Managed Care – PPO

## 2011-01-18 ENCOUNTER — Ambulatory Visit (INDEPENDENT_AMBULATORY_CARE_PROVIDER_SITE_OTHER): Payer: BC Managed Care – PPO | Admitting: *Deleted

## 2011-01-18 DIAGNOSIS — Z23 Encounter for immunization: Secondary | ICD-10-CM

## 2011-02-08 ENCOUNTER — Ambulatory Visit (INDEPENDENT_AMBULATORY_CARE_PROVIDER_SITE_OTHER): Payer: BC Managed Care – PPO | Admitting: Internal Medicine

## 2011-02-08 ENCOUNTER — Encounter: Payer: Self-pay | Admitting: Internal Medicine

## 2011-02-08 VITALS — BP 120/80 | HR 75 | Temp 98.9°F | Ht 68.0 in | Wt 176.8 lb

## 2011-02-08 DIAGNOSIS — R599 Enlarged lymph nodes, unspecified: Secondary | ICD-10-CM

## 2011-02-08 DIAGNOSIS — J069 Acute upper respiratory infection, unspecified: Secondary | ICD-10-CM

## 2011-02-08 LAB — POCT RAPID STREP A (OFFICE): Rapid Strep A Screen: NEGATIVE

## 2011-02-08 NOTE — Progress Notes (Signed)
  Subjective:    Patient ID: Leah Cameron, female    DOB: [birth year], 60 y.o.   MRN: [id number]  HPI Not feeling well for 3 days, soreness to touch at  the left anterior neck, several lymphadenopathies?. Slightly dizzy as well, mostly when she turns around or stands up.  Past Medical History  Diagnosis Date  . GERD (gastroesophageal reflux disease)   . Hypertension   . Allergic rhinitis   . RLS (restless legs syndrome)   . Thyromegaly   . Back pain, chronic   . Stricture esophagus     last EGD and dilatation 3/09  . Fibromyalgia     sees Dr.Davenshwar  . Menopause     on lexapro was started by gyn  . Syncope 4/11    admitted thought to be due to over treatment of hypertension     Review of Systems Had some subjective fever, runny nose. Feeling slightly achy Denies any cough or chest congestion.    Objective:   Physical Exam  Constitutional: She appears well-developed and well-nourished.  HENT:  Head: Normocephalic and atraumatic.    Right Ear: External ear normal.  Left Ear: External ear normal.       Nose is slightly congested, throat without redness or discharge, uvula midline. Neck: Thyroid gland is slightly enlarged but not tender, not a new finding. She does have a couple of lymphadenopathies on the left, there are less than 1 cm mobile. She has also submandibular glands, the left one is slightly larger and slightly more tender than the right one. It is not hard or attached to deeper structures  Pulmonary/Chest: Effort normal and breath sounds normal. No respiratory distress. She has no wheezes. She has no rales.  Musculoskeletal:       No axillary lymphadenopathies on either side          Assessment & Plan:  URI: Most likely the patient has had URI, will recommend conservative treatment

## 2011-02-08 NOTE — Patient Instructions (Signed)
Rest, fluids , tylenol For cough, take Mucinex DM twice a day as needed  Call if no better in few days Call anytime if the symptoms are severe  

## 2011-04-11 ENCOUNTER — Other Ambulatory Visit: Payer: Self-pay | Admitting: Internal Medicine

## 2011-04-16 ENCOUNTER — Other Ambulatory Visit (HOSPITAL_COMMUNITY)
Admission: RE | Admit: 2011-04-16 | Discharge: 2011-04-16 | Disposition: A | Discharge status: Home / Self Care | Payer: BC Managed Care – PPO | Source: Ambulatory Visit | Attending: Obstetrics and Gynecology | Admitting: Obstetrics and Gynecology

## 2011-04-16 ENCOUNTER — Other Ambulatory Visit: Payer: Self-pay | Admitting: Obstetrics and Gynecology

## 2011-04-16 DIAGNOSIS — Z124 Encounter for screening for malignant neoplasm of cervix: Secondary | ICD-10-CM | POA: Insufficient documentation

## 2011-04-19 ENCOUNTER — Other Ambulatory Visit: Payer: Self-pay | Admitting: Obstetrics and Gynecology

## 2011-04-19 ENCOUNTER — Other Ambulatory Visit: Payer: Self-pay | Admitting: Internal Medicine

## 2011-04-19 DIAGNOSIS — N6310 Unspecified lump in the right breast, unspecified quadrant: Secondary | ICD-10-CM

## 2011-04-26 ENCOUNTER — Ambulatory Visit
Admission: RE | Admit: 2011-04-26 | Discharge: 2011-04-26 | Disposition: A | Discharge status: Home / Self Care | Payer: BC Managed Care – PPO | Source: Ambulatory Visit | Attending: Obstetrics and Gynecology | Admitting: Obstetrics and Gynecology

## 2011-04-26 DIAGNOSIS — N6310 Unspecified lump in the right breast, unspecified quadrant: Secondary | ICD-10-CM

## 2011-06-24 ENCOUNTER — Other Ambulatory Visit: Payer: Self-pay | Admitting: Internal Medicine

## 2011-06-24 NOTE — Telephone Encounter (Signed)
Refill done.  

## 2011-07-20 ENCOUNTER — Other Ambulatory Visit: Payer: Self-pay | Admitting: Obstetrics and Gynecology

## 2011-07-20 DIAGNOSIS — Z1231 Encounter for screening mammogram for malignant neoplasm of breast: Secondary | ICD-10-CM

## 2011-08-10 ENCOUNTER — Other Ambulatory Visit: Payer: Self-pay | Admitting: Internal Medicine

## 2011-08-10 NOTE — Telephone Encounter (Signed)
Refill done.  

## 2011-08-17 ENCOUNTER — Ambulatory Visit
Admission: RE | Admit: 2011-08-17 | Discharge: 2011-08-17 | Disposition: A | Discharge status: Home / Self Care | Payer: BC Managed Care – PPO | Source: Ambulatory Visit | Attending: Obstetrics and Gynecology | Admitting: Obstetrics and Gynecology

## 2011-08-17 DIAGNOSIS — Z1231 Encounter for screening mammogram for malignant neoplasm of breast: Secondary | ICD-10-CM

## 2011-08-30 ENCOUNTER — Encounter: Payer: BC Managed Care – PPO | Admitting: Internal Medicine

## 2011-08-30 DIAGNOSIS — Z0289 Encounter for other administrative examinations: Secondary | ICD-10-CM

## 2011-09-23 ENCOUNTER — Other Ambulatory Visit: Payer: Self-pay | Admitting: Internal Medicine

## 2011-09-23 NOTE — Telephone Encounter (Signed)
Refill done.  

## 2011-10-11 ENCOUNTER — Telehealth: Payer: Self-pay | Admitting: Internal Medicine

## 2011-10-11 NOTE — Telephone Encounter (Signed)
Opened in error

## 2011-10-12 ENCOUNTER — Telehealth: Payer: Self-pay | Admitting: *Deleted

## 2011-10-12 NOTE — Telephone Encounter (Signed)
Spoke with Pt who indicated that she has tried OTC med Solicitor)  with no relief. PA form completed and faxed.

## 2011-10-12 NOTE — Telephone Encounter (Signed)
Prior Auth approved 09-21-11 until 10-11-12, pharmacy notified via fax, approval letter scan to chart.

## 2011-10-26 ENCOUNTER — Ambulatory Visit (INDEPENDENT_AMBULATORY_CARE_PROVIDER_SITE_OTHER): Payer: BC Managed Care – PPO | Admitting: Internal Medicine

## 2011-10-26 ENCOUNTER — Encounter: Payer: Self-pay | Admitting: Internal Medicine

## 2011-10-26 VITALS — BP 110/78 | HR 80 | Temp 98.4°F | Ht 67.5 in | Wt 171.0 lb

## 2011-10-26 DIAGNOSIS — I10 Essential (primary) hypertension: Secondary | ICD-10-CM

## 2011-10-26 DIAGNOSIS — E049 Nontoxic goiter, unspecified: Secondary | ICD-10-CM

## 2011-10-26 DIAGNOSIS — Z0000 Encounter for general adult medical examination without abnormal findings: Secondary | ICD-10-CM

## 2011-10-26 DIAGNOSIS — M549 Dorsalgia, unspecified: Secondary | ICD-10-CM

## 2011-10-26 LAB — CBC WITH DIFFERENTIAL/PLATELET
Basophils Absolute: 0.1 10*3/uL (ref 0.0–0.1)
Eosinophils Relative: 2.6 % (ref 0.0–5.0)
HCT: 38.6 % (ref 36.0–46.0)
Lymphs Abs: 1.6 10*3/uL (ref 0.7–4.0)
MCV: 84.6 fl (ref 78.0–100.0)
Monocytes Absolute: 0.3 10*3/uL (ref 0.1–1.0)
Platelets: 140 10*3/uL — ABNORMAL LOW (ref 150.0–400.0)
RDW: 14.8 % — ABNORMAL HIGH (ref 11.5–14.6)

## 2011-10-26 NOTE — Progress Notes (Signed)
  Subjective:    Patient ID: Leah Cameron, female    DOB: 1950-10-03, 61 y.o.   MRN: [id number]  HPI CPX  Past Medical History: HTN GERD and esophageal stricture, last EGD and dilatation 3-09 Allergic rhinitis RLS History thyromegaly Chronic back pain, sees Dr. Jerl Santos FIBROMYALGIA , sees Dr Arizona Constable 05-2004 neg Menopause --on lexapro , was started by gyn  syncope 4/11, admitted, thought to be due to over treatment of hypertension  Past Surgical History: lumpectomy - 2000 rt knee surgery 2002 rt knee scope 2003 shoulder surgery 2004  Social History: Married, two children Retired 2001, Psychologist, forensic, disable  active-- does some walking  diet-- not healthy, lots of snaking  Tobacco-- never ETOH-- socially   Family History: colon Ca - no breast Ca - 1/2 sister lung Ca - cousin spine Ca - cousin CVA - sister age 35, mother , father DM - aunt CAD - GF   Review of Systems In general doing well, good medication compliance with BP meds, normal ambulatory BPs. No chest pain or shortness of breath No nausea, vomiting, diarrhea or blood in the stools. No dysuria or gross hematuria. GERD symptoms well control as long as she takes PPIs. Continue with chronic back pain, see assessment and plan.    Objective:   Physical Exam  General -- alert, well-developed, and well-nourished.   Neck --thyroid gland slightly enlarged, nontender or. Normal carotid pulse, no LADs Lungs -- normal respiratory effort, no intercostal retractions, no accessory muscle use, and normal breath sounds.   Heart-- normal rate, regular rhythm, no murmur, and no gallop.   Abdomen--soft, non-tender, no distention, no masses, no HSM, no guarding, and no rigidity.  No bruit. Extremities-- no pretibial edema bilaterally Neurologic-- alert & oriented X3 and strength normal in all extremities. Psych-- Cognition and judgment appear intact. Alert and cooperative with normal attention  span and concentration.  not anxious appearing and not depressed appearing.       Assessment & Plan:

## 2011-10-26 NOTE — Assessment & Plan Note (Signed)
Patient reports that she recently saw Dr. Jerl Santos  For spinal stenosis, doing PT Takes hydrocodone as needed

## 2011-10-26 NOTE — Assessment & Plan Note (Addendum)
Td 7-09 zostavax  discussed , not interested at this point  Female care per gyn, PAPs per gyn Mammogram:  07-2011,  normal  Colonoscopy:  01/10/2002,   Diverticulosis , due for another Cscope 10- 2013 DEXAs  Per Rheumatology, to have one tomorrow  Diet and exercise discussed, continue calcium, vitamin D. and multivitamins

## 2011-10-26 NOTE — Assessment & Plan Note (Signed)
Long history thyromegaly, last ultrasound thyroid biopsy  10- 2010, biopsy benign

## 2011-10-26 NOTE — Patient Instructions (Signed)
If GI does not contact you about a colonoscopy this year, please let me know

## 2011-10-26 NOTE — Assessment & Plan Note (Signed)
Seems to be well-controlled, continue with same medicines

## 2011-10-27 LAB — LIPID PANEL
Cholesterol: 184 mg/dL (ref 0–200)
VLDL: 13.8 mg/dL (ref 0.0–40.0)

## 2011-10-27 LAB — COMPREHENSIVE METABOLIC PANEL
CO2: 28 mEq/L (ref 19–32)
Creatinine, Ser: 0.8 mg/dL (ref 0.4–1.2)
GFR: 92.71 mL/min (ref >60.00)
Glucose, Bld: 95 mg/dL (ref 70–99)
Sodium: 141 mEq/L (ref 135–145)
Total Bilirubin: 0.4 mg/dL (ref 0.3–1.2)
Total Protein: 7.1 g/dL (ref 6.0–8.3)

## 2011-10-28 ENCOUNTER — Encounter: Payer: Self-pay | Admitting: *Deleted

## 2012-01-12 ENCOUNTER — Encounter: Payer: Self-pay | Admitting: Gastroenterology

## 2012-01-18 ENCOUNTER — Encounter: Payer: Self-pay | Admitting: Gastroenterology

## 2012-02-09 ENCOUNTER — Emergency Department (HOSPITAL_COMMUNITY): Payer: BC Managed Care – PPO

## 2012-02-09 ENCOUNTER — Encounter (HOSPITAL_COMMUNITY): Payer: Self-pay | Admitting: Emergency Medicine

## 2012-02-09 ENCOUNTER — Encounter: Payer: Self-pay | Admitting: Internal Medicine

## 2012-02-09 ENCOUNTER — Emergency Department (HOSPITAL_COMMUNITY)
Admission: EM | Admit: 2012-02-09 | Discharge: 2012-02-10 | Disposition: A | Discharge status: Home / Self Care | Payer: BC Managed Care – PPO | Attending: Emergency Medicine | Admitting: Emergency Medicine

## 2012-02-09 DIAGNOSIS — Z7982 Long term (current) use of aspirin: Secondary | ICD-10-CM | POA: Insufficient documentation

## 2012-02-09 DIAGNOSIS — Z79899 Other long term (current) drug therapy: Secondary | ICD-10-CM | POA: Insufficient documentation

## 2012-02-09 DIAGNOSIS — Z8669 Personal history of other diseases of the nervous system and sense organs: Secondary | ICD-10-CM | POA: Insufficient documentation

## 2012-02-09 DIAGNOSIS — K222 Esophageal obstruction: Secondary | ICD-10-CM | POA: Insufficient documentation

## 2012-02-09 DIAGNOSIS — I10 Essential (primary) hypertension: Secondary | ICD-10-CM | POA: Insufficient documentation

## 2012-02-09 DIAGNOSIS — H81399 Other peripheral vertigo, unspecified ear: Secondary | ICD-10-CM | POA: Insufficient documentation

## 2012-02-09 DIAGNOSIS — M549 Dorsalgia, unspecified: Secondary | ICD-10-CM | POA: Insufficient documentation

## 2012-02-09 DIAGNOSIS — G8929 Other chronic pain: Secondary | ICD-10-CM | POA: Insufficient documentation

## 2012-02-09 DIAGNOSIS — K219 Gastro-esophageal reflux disease without esophagitis: Secondary | ICD-10-CM | POA: Insufficient documentation

## 2012-02-09 DIAGNOSIS — J309 Allergic rhinitis, unspecified: Secondary | ICD-10-CM | POA: Insufficient documentation

## 2012-02-09 DIAGNOSIS — N951 Menopausal and female climacteric states: Secondary | ICD-10-CM | POA: Insufficient documentation

## 2012-02-09 LAB — BASIC METABOLIC PANEL
BUN: 19 mg/dL (ref 6–23)
Chloride: 102 mEq/L (ref 96–112)
GFR calc Af Amer: 86 mL/min — ABNORMAL LOW (ref >90)
GFR calc non Af Amer: 74 mL/min — ABNORMAL LOW (ref >90)
Potassium: 3.2 mEq/L — ABNORMAL LOW (ref 3.5–5.1)
Sodium: 139 mEq/L (ref 135–145)

## 2012-02-09 LAB — URINALYSIS, ROUTINE W REFLEX MICROSCOPIC
Glucose, UA: NEGATIVE mg/dL
Protein, ur: NEGATIVE mg/dL
Specific Gravity, Urine: 1.009 (ref 1.005–1.030)
Urobilinogen, UA: 0.2 mg/dL (ref 0.0–1.0)

## 2012-02-09 LAB — CBC WITH DIFFERENTIAL/PLATELET
Basophils Absolute: 0 10*3/uL (ref 0.0–0.1)
Basophils Relative: 1 % (ref 0–1)
Hemoglobin: 12.4 g/dL (ref 12.0–15.0)
Lymphocytes Relative: 33 % (ref 12–46)
MCHC: 33.5 g/dL (ref 30.0–36.0)
Monocytes Relative: 10 % (ref 3–12)
Neutro Abs: 3.4 10*3/uL (ref 1.7–7.7)
Neutrophils Relative %: 54 % (ref 43–77)
WBC: 6.3 10*3/uL (ref 4.0–10.5)

## 2012-02-09 LAB — URINE MICROSCOPIC-ADD ON

## 2012-02-09 MED ORDER — MECLIZINE HCL 25 MG PO TABS
25.0000 mg | ORAL_TABLET | Freq: Once | ORAL | Status: AC
  Administered 2012-02-09: 25 mg via ORAL
  Filled 2012-02-09: qty 1

## 2012-02-09 MED ORDER — SODIUM CHLORIDE 0.9 % IV SOLN
INTRAVENOUS | Status: DC
  Administered 2012-02-09: 75 mL/h via INTRAVENOUS

## 2012-02-09 NOTE — ED Notes (Signed)
Per EMS - pt was seen at urgent care, pt c/o generalized weakness and poor writing X 2 days. EMS started at 20G in left [facility]. CBG 85. 12 lead unremarkable. Pt is a&O x 4. No slurred speech, no facial drip no paralysis. BP 125/65. HR 72 NSR. Sat 100% on room air.

## 2012-02-10 ENCOUNTER — Ambulatory Visit: Payer: BC Managed Care – PPO

## 2012-02-10 MED ORDER — MECLIZINE HCL 25 MG PO TABS: 25.0000 mg | ORAL_TABLET | Freq: Three times a day (TID) | ORAL | Status: DC | PRN

## 2012-02-10 NOTE — ED Notes (Signed)
Rx given x1 Pt ambulating independently w/ steady gait on d/c in no acute distress, A&Ox4. D/c instructions reviewed w/ pt and family - pt and family deny any further questions or concerns at present.  

## 2012-02-10 NOTE — ED Provider Notes (Signed)
History     CSN: [id number]  Arrival date & time 02/09/12  2025   First MD Initiated Contact with Patient 02/09/12 2056      Chief Complaint  Patient presents with  . Weakness  . Dizziness    HPI Pt was seen at 2105.  Per pt, c/o sudden onset and persistence of constant "dizziness" for the past 2 to 3 days.  Pt describes her symptoms as "everything is swimming" and "like I've had too much to drink." States she "feels like I'm drunk" when she walks. Pt's symptoms worsen with turning her head or eyes to the right and body position changes.  Endorses she has had a recent runny/stuffy nose, sinus and ears congestion for the past week.  Denies fevers, no CP/SOB, no back or neck pain, no abd pain, no N/V/D, no slurred speech, no dysphagia, no facial droop, no focal motor weakness, no tingling/numbness in extremities, no injury.    Past Medical History  Diagnosis Date  . GERD (gastroesophageal reflux disease)   . Hypertension   . Allergic rhinitis   . RLS (restless legs syndrome)   . Thyromegaly   . Back pain, chronic   . Stricture esophagus     last EGD and dilatation 3/09  . Fibromyalgia     sees Dr.Davenshwar  . Menopause     on lexapro was started by gyn  . Syncope 4/11    admitted thought to be due to over treatment of hypertension    Past Surgical History  Procedure Date  . Breast lumpectomy 2000  . Knee surgery 2002    right  . Knee arthroscopy 2003    right  . Shoulder surgery 2004  . Biopsy thyroid 01-2009    negative     Family History  Problem Relation Age of Onset  . Colon cancer Neg Hx   . Breast cancer      1/2 sister  . Stroke Sister 39  . Stroke Mother   . Diabetes      aunt  . Coronary artery disease      GF  . Lung cancer      cousin    History  Substance Use Topics  . Smoking status: Never Smoker   . Smokeless tobacco: Not on file  . Alcohol Use: No      Review of Systems ROS: Statement: All systems negative except as marked or  noted in the HPI; Constitutional: Negative for fever and chills. ; ; Eyes: Negative for eye pain, redness and discharge. ; ; ENMT: Negative for ear pain, hoarseness, sore throat. +nasal congestion, sinus pressure. ; ; Cardiovascular: Negative for chest pain, palpitations, diaphoresis, dyspnea and peripheral edema. ; ; Respiratory: Negative for cough, wheezing and stridor. ; ; Gastrointestinal: Negative for nausea, vomiting, diarrhea, abdominal pain, blood in stool, hematemesis, jaundice and rectal bleeding. . ; ; Genitourinary: Negative for dysuria, flank pain and hematuria. ; ; Musculoskeletal: Negative for back pain and neck pain. Negative for swelling and trauma.; ; Skin: Negative for pruritus, rash, abrasions, blisters, bruising and skin lesion.; ; Neuro: +dizziness. Negative for headache, lightheadedness and neck stiffness. Negative for weakness, altered level of consciousness , altered mental status, extremity weakness, paresthesias, involuntary movement, seizure and syncope.       Allergies  Cyclobenzaprine hcl; Penicillins; and Tramadol hcl  Home Medications   Current Outpatient Rx  Name Route Sig Dispense Refill  . AMLODIPINE BESYLATE 10 MG PO TABS  TAKE 1 TABLET DAILY 90 tablet  3  . VITAMIN C PO Oral Take 1 tablet by mouth daily.    . ASPIRIN 81 MG PO TABS Oral Take 81 mg by mouth daily.      Marland Kitchen BENAZEPRIL HCL 10 MG PO TABS Oral Take 1 tablet (10 mg total) by mouth daily. 90 tablet 0  . CALCIUM CARBONATE-VITAMIN D 600-400 MG-UNIT PO CHEW Oral Chew 1 tablet by mouth daily.    Marland Kitchen ESCITALOPRAM OXALATE 10 MG PO TABS  TAKE 1 TABLET DAILY 90 tablet 3  . HYDROCHLOROTHIAZIDE 25 MG PO TABS  TAKE 1 TABLET DAILY 90 tablet 3  . HYDROCODONE-ACETAMINOPHEN 5-500 MG PO TABS Oral Take 1 tablet by mouth every 6 (six) hours as needed. For pain    . LANSOPRAZOLE 30 MG PO CPDR  TAKE ONE (1) CAPSULE(S) TWICE DAILY 60 capsule 3  . LIDOCAINE 5 % EX PTCH Transdermal Place 1 patch onto the skin every 12 (twelve)  hours. Remove & Discard patch within 12 hours or as directed by MD     . ADULT MULTIVITAMIN W/MINERALS CH Oral Take 1 tablet by mouth daily.    Marland Kitchen ZOLPIDEM TARTRATE 10 MG PO TABS Oral Take 10 mg by mouth at bedtime as needed. For insomnia    . MECLIZINE HCL 25 MG PO TABS Oral Take 1 tablet (25 mg total) by mouth 3 (three) times daily as needed for dizziness. 15 tablet 0    BP 133/86  Pulse 68  Temp 98.5 F (36.9 C) (Oral)  Resp 18  SpO2 100%  Physical Exam 2110: Physical examination:  Nursing notes reviewed; Vital signs and O2 SAT reviewed;  Constitutional: Well developed, Well nourished, Well hydrated, In no acute distress; Head:  Normocephalic, atraumatic; Eyes: EOMI, PERRL, No scleral icterus; ENMT: Mouth and pharynx normal, +edemetous nasal turbinates bilat with clear rhinorrhea.  Mucous membranes moist; Neck: Supple, Full range of motion, No lymphadenopathy; Cardiovascular: Regular rate and rhythm, No murmur, rub, or gallop; Respiratory: Breath sounds clear & equal bilaterally, No rales, rhonchi, wheezes.  Speaking full sentences with ease, Normal respiratory effort/excursion; Chest: Nontender, Movement normal; Abdomen: Soft, Nontender, Nondistended, Normal bowel sounds;; Extremities: Pulses normal, No tenderness, No edema, No calf edema or asymmetry..; Neuro: AA&Ox3, Major CN grossly intact.  Strength 5/5 equal bilat UE's and LE's.  DTR 2/4 equal bilat UE's and LE's.  No gross sensory deficits.  Normal cerebellar testing bilat UE's (finger-nose) and LE's (heel-shin).  No pronator drift.  Speech clear.  No facial droop.  +right horizontal gaze fatigable nystagmus which reproduces pt's symptoms. Distal NMS intact with bilat hands having intact sensation and strength in the distribution of the median, radial, and ulnar nerve function;; Skin: Color normal, Warm, Dry.   ED Course  Procedures    MDM  MDM Reviewed: nursing note, vitals and previous chart Reviewed previous: ECG Interpretation:  ECG, labs, x-ray and CT scan      Date: 02/10/2012  Rate: 67  Rhythm: normal sinus rhythm  QRS Axis: normal  Intervals: normal  ST/T Wave abnormalities: normal  Conduction Disutrbances:none  Narrative Interpretation:   Old EKG Reviewed: unchanged; no significant changes from previous EKG dated 07/20/2009.  Results for orders placed during the hospital encounter of 02/09/12  BASIC METABOLIC PANEL      Component Value Range   Sodium 139  135 - 145 mEq/L   Potassium 3.2 (*) 3.5 - 5.1 mEq/L   Chloride 102  96 - 112 mEq/L   CO2 28  19 - 32 mEq/L  Glucose, Bld 85  70 - 99 mg/dL   BUN 19  6 - 23 mg/dL   Creatinine, Ser 7.82  0.50 - 1.10 mg/dL   Calcium 95.6  8.4 - 21.3 mg/dL   GFR calc non Af Amer 74 (*) >90 mL/min   GFR calc Af Amer 86 (*) >90 mL/min  CBC WITH DIFFERENTIAL      Component Value Range   WBC 6.3  4.0 - 10.5 K/uL   RBC 4.68  3.87 - 5.11 MIL/uL   Hemoglobin 12.4  12.0 - 15.0 g/dL   HCT 08.6  57.8 - 46.9 %   MCV 79.1  78.0 - 100.0 fL   MCH 26.5  26.0 - 34.0 pg   MCHC 33.5  30.0 - 36.0 g/dL   RDW 62.9  52.8 - 41.3 %   Platelets 142 (*) 150 - 400 K/uL   Neutrophils Relative 54  43 - 77 %   Neutro Abs 3.4  1.7 - 7.7 K/uL   Lymphocytes Relative 33  12 - 46 %   Lymphs Abs 2.0  0.7 - 4.0 K/uL   Monocytes Relative 10  3 - 12 %   Monocytes Absolute 0.6  0.1 - 1.0 K/uL   Eosinophils Relative 2  0 - 5 %   Eosinophils Absolute 0.2  0.0 - 0.7 K/uL   Basophils Relative 1  0 - 1 %   Basophils Absolute 0.0  0.0 - 0.1 K/uL  TROPONIN I      Component Value Range   Troponin I <0.30  <0.30 ng/mL  URINALYSIS, ROUTINE W REFLEX MICROSCOPIC      Component Value Range   Color, Urine YELLOW  YELLOW   APPearance CLEAR  CLEAR   Specific Gravity, Urine 1.009  1.005 - 1.030   pH 7.0  5.0 - 8.0   Glucose, UA NEGATIVE  NEGATIVE mg/dL   Hgb urine dipstick NEGATIVE  NEGATIVE   Bilirubin Urine NEGATIVE  NEGATIVE   Ketones, ur NEGATIVE  NEGATIVE mg/dL   Protein, ur NEGATIVE  NEGATIVE  mg/dL   Urobilinogen, UA 0.2  0.0 - 1.0 mg/dL   Nitrite NEGATIVE  NEGATIVE   Leukocytes, UA TRACE (*) NEGATIVE  URINE MICROSCOPIC-ADD ON      Component Value Range   Squamous Epithelial / LPF FEW (*) RARE   WBC, UA 0-2  <3 WBC/hpf   Dg Chest 2 View 02/09/2012  *RADIOLOGY REPORT*  Clinical Data: Weakness, dizziness  CHEST - 2 VIEW  Comparison: 42,011  Findings: Stable heart size and vascularity.  Clear lungs. Negative for pneumonia, CHF, effusion, collapse, consolidation, or pneumothorax.  Scoliosis of the spine, unchanged.  IMPRESSION: Stable exam.  No superimposed acute process   Original Report Authenticated By: Judie Petit. Ruel Favors, M.D.    Ct Head Wo Contrast 02/09/2012  *RADIOLOGY REPORT*  Clinical Data: Generalized weakness.  CT HEAD WITHOUT CONTRAST  Technique:  Contiguous axial images were obtained from the base of the skull through the vertex without contrast.  Comparison: Head CT scan 07/20/2009 and brain MRI 07/21/2009.  Findings: The brain appears normal evidence of infarct, hemorrhage, mass lesion, mass effect, midline shift or abnormal extra-axial fluid collection.  No hydrocephalus or pneumocephalus.  Calvarium intact.  IMPRESSION: Negative exam.   Original Report Authenticated By: Bernadene Bell. D'ALESSIO, M.D.      1215:  Pt ambulatory with steady gait, easy resps.  Denies return of symptoms while walking.  Denies any further symptoms when turning her head or eyes. Not orthostatic.  States she feels "much better now" and wants to go home.  Pt talking and laughing with multiple family members in her exam room, NAD. Dx and testing d/w pt and family.  Questions answered.  Verb understanding, agreeable to d/c home with outpt f/u.         Laray Anger, DO 02/12/12 [id number]

## 2012-02-11 LAB — URINE CULTURE

## 2012-02-15 ENCOUNTER — Ambulatory Visit (AMBULATORY_SURGERY_CENTER): Payer: BC Managed Care – PPO

## 2012-02-15 VITALS — Ht 68.0 in | Wt 167.7 lb

## 2012-02-15 DIAGNOSIS — Z1211 Encounter for screening for malignant neoplasm of colon: Secondary | ICD-10-CM

## 2012-02-15 MED ORDER — NA SULFATE-K SULFATE-MG SULF 17.5-3.13-1.6 GM/177ML PO SOLN: 1.0000 | Freq: Once | ORAL | Status: DC

## 2012-02-16 ENCOUNTER — Encounter: Payer: Self-pay | Admitting: Gastroenterology

## 2012-02-27 ENCOUNTER — Other Ambulatory Visit: Payer: Self-pay | Admitting: Internal Medicine

## 2012-02-28 NOTE — Telephone Encounter (Signed)
Refill done.  

## 2012-02-29 ENCOUNTER — Ambulatory Visit (AMBULATORY_SURGERY_CENTER): Payer: BC Managed Care – PPO | Admitting: Gastroenterology

## 2012-02-29 ENCOUNTER — Encounter: Payer: Self-pay | Admitting: Gastroenterology

## 2012-02-29 VITALS — BP 127/67 | HR 58 | Temp 96.0°F | Resp 16 | Ht 68.0 in | Wt 167.0 lb

## 2012-02-29 DIAGNOSIS — Z1211 Encounter for screening for malignant neoplasm of colon: Secondary | ICD-10-CM

## 2012-02-29 DIAGNOSIS — K5730 Diverticulosis of large intestine without perforation or abscess without bleeding: Secondary | ICD-10-CM

## 2012-02-29 MED ORDER — SODIUM CHLORIDE 0.9 % IV SOLN: 500.0000 mL | INTRAVENOUS | Status: DC

## 2012-02-29 NOTE — Progress Notes (Signed)
NO EGG OR SOY ALLERGY. EWM 

## 2012-02-29 NOTE — Progress Notes (Signed)
Propofol given over incremental dosages 

## 2012-02-29 NOTE — Patient Instructions (Addendum)

## 2012-02-29 NOTE — Op Note (Signed)
[facility] [address] [place], [address]   COLONOSCOPY PROCEDURE REPORT  PATIENT: Leah, Cameron  MR#: [id number] BIRTHDATE: [birth year] , 61  yrs. old GENDER: Female ENDOSCOPIST: Louis Meckel, MD REFERRED XL:KGMW Drue Novel, M.D. PROCEDURE DATE:  02/29/2012 PROCEDURE:   Colonoscopy, diagnostic ASA CLASS:   Class II INDICATIONS:average risk screening. MEDICATIONS: MAC sedation, administered by CRNA and propofol (Diprivan) 150mg  IV  DESCRIPTION OF PROCEDURE:   After the risks benefits and alternatives of the procedure were thoroughly explained, informed consent was obtained.  A digital rectal exam revealed no abnormalities of the rectum.   The LB [id number]  endoscope was introduced through the anus and advanced to the cecum, which was identified by both the appendix and ileocecal valve. No adverse events experienced.   The quality of the prep was Suprep excellent The instrument was then slowly withdrawn as the colon was fully examined.      COLON FINDINGS: Mild diverticulosis was noted in the sigmoid colon. The colon mucosa was otherwise normal.  Retroflexed views revealed no abnormalities. The time to cecum=5 minutes 25 seconds. Withdrawal time=6 minutes 44 seconds.  The scope was withdrawn and the procedure completed. COMPLICATIONS: There were no complications.  ENDOSCOPIC IMPRESSION: 1.   Mild diverticulosis was noted in the sigmoid colon 2.   The colon mucosa was otherwise normal  RECOMMENDATIONS: Continue current colorectal screening recommendations for "routine risk" patients with a repeat colonoscopy in 10 years.   eSigned:  Louis Meckel, MD 02/29/2012 9:07 AM   cc:

## 2012-02-29 NOTE — Progress Notes (Signed)
Patient did not have preoperative order for IV antibiotic SSI prophylaxis. [phone]) Patient did not experience any of the following events: a burn prior to discharge; a fall within the facility; wrong site/side/patient/procedure/implant event; or a hospital transfer or hospital admission upon discharge from the facility. [phone])   April Mirts RN, charted

## 2012-03-01 ENCOUNTER — Telehealth: Payer: Self-pay

## 2012-03-01 NOTE — Telephone Encounter (Signed)
  Follow up Call-  Call back number 02/29/2012  Post procedure Call Back phone  # [phone]  Permission to leave phone message Yes     Patient questions:  Do you have a fever, pain , or abdominal swelling? no Pain Score  0 *  Have you tolerated food without any problems? yes  Have you been able to return to your normal activities? yes  Do you have any questions about your discharge instructions: Diet   no Medications  no Follow up visit  no  Do you have questions or concerns about your Care? no  Actions: * If pain score is 4 or above: No action needed, pain <4.

## 2012-03-19 ENCOUNTER — Other Ambulatory Visit: Payer: Self-pay | Admitting: Internal Medicine

## 2012-03-20 NOTE — Telephone Encounter (Signed)
Refill done.  

## 2012-04-20 ENCOUNTER — Ambulatory Visit (INDEPENDENT_AMBULATORY_CARE_PROVIDER_SITE_OTHER): Payer: BC Managed Care – PPO

## 2012-04-20 ENCOUNTER — Other Ambulatory Visit (INDEPENDENT_AMBULATORY_CARE_PROVIDER_SITE_OTHER): Payer: BC Managed Care – PPO

## 2012-04-20 DIAGNOSIS — Z0389 Encounter for observation for other suspected diseases and conditions ruled out: Secondary | ICD-10-CM

## 2012-04-20 DIAGNOSIS — Z23 Encounter for immunization: Secondary | ICD-10-CM

## 2012-04-20 LAB — CBC WITH DIFFERENTIAL/PLATELET
Basophils Absolute: 0 10*3/uL (ref 0.0–0.1)
Lymphocytes Relative: 27.1 % (ref 12.0–46.0)
Lymphs Abs: 1.7 10*3/uL (ref 0.7–4.0)
Monocytes Relative: 6.1 % (ref 3.0–12.0)
Neutrophils Relative %: 64.7 % (ref 43.0–77.0)
Platelets: 163 10*3/uL (ref 150.0–400.0)
RDW: 15.1 % — ABNORMAL HIGH (ref 11.5–14.6)

## 2012-04-20 LAB — COMPREHENSIVE METABOLIC PANEL
ALT: 24 U/L (ref 0–35)
Albumin: 4.1 g/dL (ref 3.5–5.2)
CO2: 32 mEq/L (ref 19–32)
Calcium: 9.3 mg/dL (ref 8.4–10.5)
Chloride: 99 mEq/L (ref 96–112)
GFR: 76.08 mL/min (ref >60.00)
Sodium: 138 mEq/L (ref 135–145)
Total Protein: 7.5 g/dL (ref 6.0–8.3)

## 2012-04-21 LAB — DRUG SCREEN, URINE
Amphetamine Screen, Ur: NEGATIVE
Barbiturate Quant, Ur: NEGATIVE
Benzodiazepines.: NEGATIVE
Cocaine Metabolites: NEGATIVE
Creatinine,U: 165.64 mg/dL
Marijuana Metabolite: NEGATIVE
Methadone: NEGATIVE
Opiates: NEGATIVE
Phencyclidine (PCP): NEGATIVE
Propoxyphene: NEGATIVE

## 2012-06-13 ENCOUNTER — Other Ambulatory Visit: Payer: Self-pay | Admitting: Internal Medicine

## 2012-06-13 NOTE — Telephone Encounter (Signed)
Refill done.  

## 2012-06-27 ENCOUNTER — Telehealth: Payer: Self-pay | Admitting: Internal Medicine

## 2012-06-27 NOTE — Telephone Encounter (Signed)
Patient is due for a mid year checkup, please schedule

## 2012-06-27 NOTE — Telephone Encounter (Signed)
Spoke with patient after receiving request for benazepril refill. Made patient aware that she needed to see the MD per his request, appt made for 1:30 Wed 06/28/12. Refill not done pending appt.

## 2012-06-28 ENCOUNTER — Encounter: Payer: Self-pay | Admitting: Internal Medicine

## 2012-06-28 ENCOUNTER — Ambulatory Visit (INDEPENDENT_AMBULATORY_CARE_PROVIDER_SITE_OTHER): Payer: BC Managed Care – PPO | Admitting: Internal Medicine

## 2012-06-28 VITALS — BP 134/84 | HR 67 | Temp 98.1°F | Wt 171.0 lb

## 2012-06-28 DIAGNOSIS — I10 Essential (primary) hypertension: Secondary | ICD-10-CM

## 2012-06-28 LAB — BASIC METABOLIC PANEL
BUN: 13 mg/dL (ref 6–23)
CO2: 31 mEq/L (ref 19–32)
Calcium: 9.6 mg/dL (ref 8.4–10.5)
Chloride: 100 mEq/L (ref 96–112)
Creatinine, Ser: 1 mg/dL (ref 0.4–1.2)
Glucose, Bld: 89 mg/dL (ref 70–99)

## 2012-06-28 MED ORDER — BENAZEPRIL HCL 20 MG PO TABS: 20.0000 mg | ORAL_TABLET | Freq: Every day | ORAL | Status: DC

## 2012-06-28 NOTE — Progress Notes (Signed)
  Subjective:    Patient ID: Leah Cameron, female    DOB: May 30, 1950, 62 y.o.   MRN: [id number]  HPI Followup visit for hypertension. Last potassium was low, we adjusted her medication, BP remains well-controlled. Medication list reviewed, good compliance, hydrocodone prescribed by rheumatology, fibromyalgia well controlled .  Past Medical History  Diagnosis Date  . GERD (gastroesophageal reflux disease)   . Hypertension   . Allergic rhinitis   . RLS (restless legs syndrome)   . Thyromegaly   . Back pain, chronic   . Stricture esophagus     last EGD and dilatation 3/09  . Fibromyalgia     sees Dr.Davenshwar  . Menopause     on lexapro was started by gyn  . Syncope 4/11    admitted thought to be due to over treatment of hypertension  . Vertigo     went to ED on 10/30  . Scoliosis    Past Surgical History  Procedure Laterality Date  . Breast lumpectomy  2000  . Knee surgery  1999/2002    left   . Knee arthroscopy  2003    right  . Shoulder surgery  2004  . Biopsy thyroid  01-2009    negative   . Tonsillectomy    . Colonoscopy     Social History:  Married, two children  Retired 2001, Psychologist, forensic, disable  Tobacco-- never  ETOH-- socially   Family History:  colon Ca - no  breast Ca - 1/2 sister  lung Ca - cousin  spine Ca - cousin  CVA - sister age 22, mother , father  DM - aunt  CAD - GF    Review of Systems  had some abdominal pain yesterday but denies nausea, vomiting, diarrhea. Insomnia well-controlled with Ambien. No chest pain or shortness or breath    Objective:   Physical Exam General -- alert, well-developed Lungs -- normal respiratory effort, no intercostal retractions, no accessory muscle use, and normal breath sounds.   Heart-- normal rate, regular rhythm, no murmur, and no gallop.   Extremities-- no pretibial edema bilaterally  Neurologic-- alert & oriented X3 and strength normal in all extremities. Psych-- Cognition and  judgment appear intact. Alert and cooperative with normal attention span and concentration.  not anxious appearing and not depressed appearing.      Assessment & Plan:

## 2012-06-28 NOTE — Assessment & Plan Note (Addendum)
Last potassium low, we decrease HCTZ to half an increased Lotensin from 10 mg to 20 mg. BP remains well-controlled. Plan: Check a BMP Refill medications which results, if she is to remain on Lotensin, will need 20 mg tablets one by mouth daily. Addendum, BMP ok, lotensin changed to 20 mg

## 2012-07-11 ENCOUNTER — Other Ambulatory Visit: Payer: Self-pay | Admitting: *Deleted

## 2012-07-11 MED ORDER — AMLODIPINE BESYLATE 10 MG PO TABS: ORAL_TABLET | ORAL | Status: DC

## 2012-07-11 MED ORDER — ESCITALOPRAM OXALATE 10 MG PO TABS: ORAL_TABLET | ORAL | Status: DC

## 2012-07-11 NOTE — Telephone Encounter (Signed)
VM left stating Pt is going to mail order so a 90 day supply is needed. Per pharmacy a faxed was sent in reference to the lexapro and the norvasc. Pharmacy is requesting a response to fax  Rx sent in on 06-13-12 for #90 1. Rx resent to the pharmacy.  [id number] [phone]

## 2012-07-20 ENCOUNTER — Other Ambulatory Visit: Payer: Self-pay | Admitting: *Deleted

## 2012-07-20 NOTE — Telephone Encounter (Signed)
Pt left VM stating that Dr Drue Novel was suppose to review her BP meds and combine them to one. Pt notes that it is about time for her to get a refill and would like to know if this can be done now. .Please advise

## 2012-07-21 MED ORDER — BENAZEPRIL-HYDROCHLOROTHIAZIDE 20-25 MG PO TABS: 1.0000 | ORAL_TABLET | Freq: Every day | ORAL | Status: DC

## 2012-07-21 MED ORDER — AMLODIPINE BESYLATE 10 MG PO TABS: ORAL_TABLET | ORAL | Status: DC

## 2012-07-21 MED ORDER — ESCITALOPRAM OXALATE 10 MG PO TABS: ORAL_TABLET | ORAL | Status: DC

## 2012-07-21 NOTE — Telephone Encounter (Signed)
Yes, it combined Lotensin and hydrochlorothiazide in a single tablet. Prescription is ready to be sent to where ever she decides, okay to refill one year.

## 2012-07-21 NOTE — Telephone Encounter (Signed)
Discuss with patient, Rx sent. Pt states that pharmacy did not received her other meds. Rx re-sent to pharmacy

## 2012-07-27 ENCOUNTER — Other Ambulatory Visit: Payer: Self-pay

## 2012-07-27 DIAGNOSIS — Z1231 Encounter for screening mammogram for malignant neoplasm of breast: Secondary | ICD-10-CM

## 2012-08-18 ENCOUNTER — Ambulatory Visit: Payer: BC Managed Care – PPO

## 2012-08-27 ENCOUNTER — Other Ambulatory Visit: Payer: Self-pay | Admitting: Internal Medicine

## 2012-08-28 NOTE — Telephone Encounter (Signed)
Refill done.  

## 2012-09-18 ENCOUNTER — Ambulatory Visit
Admission: RE | Admit: 2012-09-18 | Discharge: 2012-09-18 | Disposition: A | Discharge status: Home / Self Care | Payer: BC Managed Care – PPO | Source: Ambulatory Visit

## 2012-09-18 DIAGNOSIS — Z1231 Encounter for screening mammogram for malignant neoplasm of breast: Secondary | ICD-10-CM

## 2012-10-27 ENCOUNTER — Telehealth: Payer: Self-pay | Admitting: *Deleted

## 2012-10-27 MED ORDER — LANSOPRAZOLE 30 MG PO CPDR: DELAYED_RELEASE_CAPSULE | ORAL | Status: DC

## 2012-10-27 NOTE — Telephone Encounter (Signed)
Rx has been refilled Lansoprazole. 10/27/12.

## 2012-10-27 NOTE — Telephone Encounter (Signed)
Prior Auth for lansoprazole 30mg  DR has been approved until 7.18.2015.

## 2012-10-29 ENCOUNTER — Other Ambulatory Visit: Payer: Self-pay | Admitting: Internal Medicine

## 2012-11-03 ENCOUNTER — Telehealth: Payer: Self-pay

## 2012-11-03 NOTE — Telephone Encounter (Signed)
PA initiated and approved for Lansoprazole 30 mg. Approval is from 10/12/12 until 11/02/13. Case ID [id number].       KP

## 2012-11-18 ENCOUNTER — Emergency Department (HOSPITAL_BASED_OUTPATIENT_CLINIC_OR_DEPARTMENT_OTHER): Payer: BC Managed Care – PPO

## 2012-11-18 ENCOUNTER — Encounter (HOSPITAL_BASED_OUTPATIENT_CLINIC_OR_DEPARTMENT_OTHER): Payer: Self-pay | Admitting: *Deleted

## 2012-11-18 ENCOUNTER — Emergency Department (HOSPITAL_BASED_OUTPATIENT_CLINIC_OR_DEPARTMENT_OTHER)
Admission: EM | Admit: 2012-11-18 | Discharge: 2012-11-18 | Disposition: A | Discharge status: Home / Self Care | Payer: BC Managed Care – PPO | Attending: Emergency Medicine | Admitting: Emergency Medicine

## 2012-11-18 DIAGNOSIS — Z7982 Long term (current) use of aspirin: Secondary | ICD-10-CM | POA: Insufficient documentation

## 2012-11-18 DIAGNOSIS — R42 Dizziness and giddiness: Secondary | ICD-10-CM | POA: Insufficient documentation

## 2012-11-18 DIAGNOSIS — R5381 Other malaise: Secondary | ICD-10-CM | POA: Insufficient documentation

## 2012-11-18 DIAGNOSIS — G8929 Other chronic pain: Secondary | ICD-10-CM | POA: Insufficient documentation

## 2012-11-18 DIAGNOSIS — Z8719 Personal history of other diseases of the digestive system: Secondary | ICD-10-CM | POA: Insufficient documentation

## 2012-11-18 DIAGNOSIS — Z862 Personal history of diseases of the blood and blood-forming organs and certain disorders involving the immune mechanism: Secondary | ICD-10-CM | POA: Insufficient documentation

## 2012-11-18 DIAGNOSIS — Z8742 Personal history of other diseases of the female genital tract: Secondary | ICD-10-CM | POA: Insufficient documentation

## 2012-11-18 DIAGNOSIS — R079 Chest pain, unspecified: Secondary | ICD-10-CM

## 2012-11-18 DIAGNOSIS — Z8639 Personal history of other endocrine, nutritional and metabolic disease: Secondary | ICD-10-CM | POA: Insufficient documentation

## 2012-11-18 DIAGNOSIS — Z8709 Personal history of other diseases of the respiratory system: Secondary | ICD-10-CM | POA: Insufficient documentation

## 2012-11-18 DIAGNOSIS — R0602 Shortness of breath: Secondary | ICD-10-CM | POA: Insufficient documentation

## 2012-11-18 DIAGNOSIS — Z880 Allergy status to penicillin: Secondary | ICD-10-CM | POA: Insufficient documentation

## 2012-11-18 DIAGNOSIS — R5383 Other fatigue: Secondary | ICD-10-CM | POA: Insufficient documentation

## 2012-11-18 DIAGNOSIS — K219 Gastro-esophageal reflux disease without esophagitis: Secondary | ICD-10-CM | POA: Insufficient documentation

## 2012-11-18 DIAGNOSIS — R0789 Other chest pain: Secondary | ICD-10-CM | POA: Insufficient documentation

## 2012-11-18 DIAGNOSIS — I10 Essential (primary) hypertension: Secondary | ICD-10-CM | POA: Insufficient documentation

## 2012-11-18 DIAGNOSIS — Z79899 Other long term (current) drug therapy: Secondary | ICD-10-CM | POA: Insufficient documentation

## 2012-11-18 DIAGNOSIS — Z3202 Encounter for pregnancy test, result negative: Secondary | ICD-10-CM | POA: Insufficient documentation

## 2012-11-18 DIAGNOSIS — Z8669 Personal history of other diseases of the nervous system and sense organs: Secondary | ICD-10-CM | POA: Insufficient documentation

## 2012-11-18 DIAGNOSIS — Z8739 Personal history of other diseases of the musculoskeletal system and connective tissue: Secondary | ICD-10-CM | POA: Insufficient documentation

## 2012-11-18 LAB — BASIC METABOLIC PANEL
GFR calc Af Amer: 78 mL/min — ABNORMAL LOW (ref >90)
GFR calc non Af Amer: 68 mL/min — ABNORMAL LOW (ref >90)
Glucose, Bld: 88 mg/dL (ref 70–99)
Potassium: 3.3 mEq/L — ABNORMAL LOW (ref 3.5–5.1)
Sodium: 140 mEq/L (ref 135–145)

## 2012-11-18 LAB — CBC WITH DIFFERENTIAL/PLATELET
Basophils Relative: 1 % (ref 0–1)
Eosinophils Absolute: 0.1 10*3/uL (ref 0.0–0.7)
Lymphs Abs: 1.9 10*3/uL (ref 0.7–4.0)
MCH: 26.7 pg (ref 26.0–34.0)
Neutrophils Relative %: 63 % (ref 43–77)
Platelets: 164 10*3/uL (ref 150–400)
RBC: 4.69 MIL/uL (ref 3.87–5.11)

## 2012-11-18 LAB — URINALYSIS, ROUTINE W REFLEX MICROSCOPIC
Ketones, ur: NEGATIVE mg/dL
Leukocytes, UA: NEGATIVE
Nitrite: NEGATIVE
Protein, ur: NEGATIVE mg/dL

## 2012-11-18 LAB — PREGNANCY, URINE: Preg Test, Ur: NEGATIVE

## 2012-11-18 LAB — TROPONIN I: Troponin I: <0.3 ng/mL (ref <0.30)

## 2012-11-18 MED ORDER — ASPIRIN 81 MG PO CHEW
243.0000 mg | CHEWABLE_TABLET | Freq: Once | ORAL | Status: AC
  Administered 2012-11-18: 243 mg via ORAL
  Filled 2012-11-18: qty 3

## 2012-11-18 NOTE — ED Provider Notes (Signed)
CSN: [id number]     Arrival date & time 11/18/12  1646 History  This chart was scribed for Leah Skeens, MD by Ardelia Mems, ED Scribe. This patient was seen in room MH01/MH01 and the patient's care was started at 5:26 PM.  First MD Initiated Contact with Patient 11/18/12 1709     Chief Complaint  Patient presents with  . Chest Pain    The history is provided by the patient. No language interpreter was used.   HPI Comments: Leah Cameron is a 62 y.o. Female with a history of HTN and GERD who presents to the Emergency Department complaining of moderate chest pain with radiation to her left shoulder and neck over the past 4 days. She also reports associated SOB. She states that she came to the ED today due to the onset of associated dizziness and feeling light-headed. She states that she has had about two to three 1-2 minute episodes of chest pain per day, for the past 4 days, onset varies followed by generalized weakness. No diaphoresis or exertional cp.  She also states that she had an episode of left eye pain yesterday which subsided. She states that she has a history of HTN. She denies personal history of MI or stroke, but reports a family history of MI. She states that she has taken 81 mg aspirin today with mild relief. She states that she has been admitted several times for chest pain. She states that she had a normal stress test abut 2 years ago, but she has not had a heart cath. She denies recent surgery or a history of cancer. She denies fever, leg swelling, cough, headache, rash, abdominal pain, focal weakness or any other symptoms. She denies smoking, denies illicit drug use and is an occasional alcohol user.  PCP- Dr. Willow Ora  Past Medical History  Diagnosis Date  . GERD (gastroesophageal reflux disease)   . Hypertension   . Allergic rhinitis   . RLS (restless legs syndrome)   . Thyromegaly   . Back pain, chronic   . Stricture esophagus     last EGD and dilatation 3/09   . Fibromyalgia     sees Dr.Davenshwar  . Menopause     on lexapro was started by gyn  . Syncope 4/11    admitted thought to be due to over treatment of hypertension  . Vertigo     went to ED on 10/30  . Scoliosis   . Scoliosis    Past Surgical History  Procedure Laterality Date  . Breast lumpectomy  2000  . Knee surgery  1999/2002    left   . Knee arthroscopy  2003    right  . Shoulder surgery  2004  . Biopsy thyroid  01-2009    negative   . Tonsillectomy    . Colonoscopy     Family History  Problem Relation Age of Onset  . Colon cancer Neg Hx   . Breast cancer      1/2 sister  . Diabetes      aunt  . Coronary artery disease      GF  . Lung cancer      cousin  . Stroke Sister 2  . Stroke Mother    History  Substance Use Topics  . Smoking status: Never Smoker   . Smokeless tobacco: Never Used  . Alcohol Use: No   OB History   Grav Para Term Preterm Abortions TAB SAB Ect Mult Living  Review of Systems  Constitutional: Negative for fever and chills.  Respiratory: Positive for shortness of breath. Negative for cough.   Cardiovascular: Positive for chest pain. Negative for leg swelling.  Gastrointestinal: Negative for nausea, vomiting and abdominal pain.  Skin: Negative for rash.  Neurological: Positive for light-headedness. Negative for headaches.  All other systems reviewed and are negative.    Allergies  Cyclobenzaprine hcl; Flexeril; Penicillins; and Tramadol hcl  Home Medications   Current Outpatient Rx  Name  Route  Sig  Dispense  Refill  . amLODipine (NORVASC) 10 MG tablet      TAKE 1 TABLET DAILY   90 tablet   1   . Ascorbic Acid (VITAMIN C PO)   Oral   Take 1 tablet by mouth daily.         Marland Kitchen aspirin 81 MG tablet   Oral   Take 81 mg by mouth daily.           . benazepril-hydrochlorthiazide (LOTENSIN HCT) 20-25 MG per tablet   Oral   Take 1 tablet by mouth daily.   90 tablet   3   . Calcium  Carbonate-Vitamin D (CALTRATE 600+D) 600-400 MG-UNIT per chew tablet   Oral   Chew 1 tablet by mouth 2 (two) times daily.          Marland Kitchen escitalopram (LEXAPRO) 10 MG tablet      TAKE 1 TABLET DAILY   90 tablet   1   . HYDROcodone-acetaminophen (VICODIN) 5-500 MG per tablet   Oral   Take 1 tablet by mouth every 6 (six) hours as needed. For pain         . lansoprazole (PREVACID) 30 MG capsule      TAKE 1 CAPSULE BY MOUTH TWICE DAILY   60 capsule   0   . lidocaine (LIDODERM) 5 %   Transdermal   Place 1 patch onto the skin every 12 (twelve) hours. Remove & Discard patch within 12 hours or as directed by MD          . Multiple Vitamin (MULTIVITAMIN WITH MINERALS) TABS   Oral   Take 1 tablet by mouth daily.         Marland Kitchen zolpidem (AMBIEN) 10 MG tablet   Oral   Take 10 mg by mouth at bedtime as needed. For insomnia          "Triage Vitals: BP 136/81  Pulse 69  Resp 19  SpO2 100%  Physical Exam  Nursing note and vitals reviewed. Constitutional: She is oriented to person, place, and time. She appears well-developed and well-nourished.  She is in no distress on exam. Well appearing.  HENT:  Head: Normocephalic and atraumatic.  Eyes: Conjunctivae are normal. Pupils are equal, round, and reactive to light.  Neck: Normal range of motion. Neck supple. No tracheal deviation present.  Cardiovascular: Normal rate, regular rhythm and normal heart sounds.   No murmur heard. Pulmonary/Chest: Effort normal and breath sounds normal. No respiratory distress.  Abdominal: Soft. Bowel sounds are normal. There is no tenderness.  Musculoskeletal: Normal range of motion. She exhibits tenderness.  She has tenderness in left parascapular region, without ecchymosis.  Neurological: She is alert and oriented to person, place, and time. No cranial nerve deficit.  Cranial nerves 2-12 intact. No obvious drift. All extremities grossly intact.   Skin: Skin is warm and dry. No rash noted.   Psychiatric: She has a normal mood and affect.    ED Course  Medications  aspirin chewable tablet 243 mg (243 mg Oral Given 11/18/12 1809)   Procedures (including critical care time)  DIAGNOSTIC STUDIES: Oxygen Saturation is 100% on RA, normal by my interpretation.    COORDINATION OF CARE: 5:35 PM- Pt advised of plan for treatment with aspirin, along with plan for diagnostic lab work and radiology and pt agrees.  Labs Reviewed  BASIC METABOLIC PANEL - Abnormal; Notable for the following:    Potassium 3.3 (*)    GFR calc non Af Amer 68 (*)    GFR calc Af Amer 78 (*)    All other components within normal limits  CBC WITH DIFFERENTIAL  TROPONIN I  PREGNANCY, URINE  URINALYSIS, ROUTINE W REFLEX MICROSCOPIC  TROPONIN I   Dg Chest 2 View  11/18/2012   *RADIOLOGY REPORT*  Clinical Data: Left chest pain with shortness of breath.  CHEST - 2 VIEW  Comparison: 02/09/2012.  Findings: The heart size and mediastinal contours are stable.  The lungs are clear.  There is no pleural effusion or pneumothorax.  A moderate thoracolumbar scoliosis is again noted.  IMPRESSION: No active cardiopulmonary process.  Scoliosis.   Original Report Authenticated By: Carey Bullocks, M.D.    1. Chest pain     MDM  CP with a two risk factors.  On arrival patient stated she would not stay for admission and wanted to make sure her heart blood work Pt kept/ observed in ED.  Delta troponin neg.  Rechecked, no cp, well appearing.  Family with patient.  Date: 11/20/2012  Rate: 71  Rhythm: sinus  QRS Axis: normal  Intervals: normal  ST/T Wave abnormalities: non specific ST changes, subtle depression inf/ lateral  Conduction Disutrbances: LVH  Narrative Interpretation:   Old EKG Reviewed: ST changes same, LVH new  Discussed with intermittent cp this may be NSTEMI or worsening CAD that can lead to acute heart attack and recommendation would be for observation/ CIEs and likely stress test.  Patient has  capacity to make decisions, understands benefits of hospitalization and risks of going home may result in worsening health condition including death.  Patient refuses hospital placement. Patient understands they may return at any time.     Leah Skeens, MD 11/20/12 1910

## 2012-11-18 NOTE — ED Notes (Signed)
Patient states that she is having pressure in her chest that started about 2 days ago, that radiates to her back and also having SOB

## 2012-11-18 NOTE — ED Notes (Signed)
Pt reports took 81mg  Aspirin PTA.

## 2012-11-18 NOTE — ED Notes (Signed)
MD at bedside. 

## 2013-01-03 ENCOUNTER — Ambulatory Visit (INDEPENDENT_AMBULATORY_CARE_PROVIDER_SITE_OTHER): Payer: BC Managed Care – PPO | Admitting: Internal Medicine

## 2013-01-03 ENCOUNTER — Encounter: Payer: Self-pay | Admitting: Internal Medicine

## 2013-01-03 VITALS — BP 126/80 | HR 69 | Temp 98.8°F | Wt 176.0 lb

## 2013-01-03 DIAGNOSIS — Z23 Encounter for immunization: Secondary | ICD-10-CM

## 2013-01-03 DIAGNOSIS — R079 Chest pain, unspecified: Secondary | ICD-10-CM

## 2013-01-03 DIAGNOSIS — E049 Nontoxic goiter, unspecified: Secondary | ICD-10-CM

## 2013-01-03 DIAGNOSIS — I10 Essential (primary) hypertension: Secondary | ICD-10-CM

## 2013-01-03 MED ORDER — POTASSIUM CHLORIDE ER 10 MEQ PO TBCR: 10.0000 meq | EXTENDED_RELEASE_TABLET | Freq: Two times a day (BID) | ORAL | Status: DC

## 2013-01-03 NOTE — Assessment & Plan Note (Signed)
Mild hypokalemia, start KCl, recheck in 2 weeks.

## 2013-01-03 NOTE — Patient Instructions (Signed)
We'll schedule ultrasound of the thyroid. Start taking one potassium tablet daily Please come back in 2 weeks for labs, make an appointment: BMP--- dx  hypertension TSH, free T3, free T4--- dx  thyromegaly

## 2013-01-03 NOTE — Assessment & Plan Note (Addendum)
Long history of thyromegaly,  Previous workup reviewed, see below. Plan: TFTs, ultrasound. ------ 01-2009 thyroid Bx (-) 12-2008 u/s IMPRESSION: The thyroid gland is enlarged bilaterally.  There is a cluster of multiple cystic and solid nodules in the left lower pole. These are likely related to multiple adenomas however neoplasm is not excluded. Ultrasound guided needle aspiration is suggested to rule out neoplasm.

## 2013-01-03 NOTE — Progress Notes (Signed)
  Subjective:    Patient ID: Leah Cameron, female    DOB: 02-09-1951, 62 y.o.   MRN: [id number]  HPI Here to discuss the following issues: both her dentist on gynecologist told her thyroid is getting larger, self examination also is consistent with mild enlargement compared to previous years. Hypertension--last potassium was slightly low, not taking any supplements Chest pain--went to the ER 11/18/2012 with chest pain, initial workup was negative, patient declined to be admitted, today she tells me she is sure it was stress, had no further symptoms.  Past Medical History  Diagnosis Date  . GERD (gastroesophageal reflux disease)   . Hypertension   . Allergic rhinitis   . RLS (restless legs syndrome)   . Thyromegaly   . Back pain, chronic   . Stricture esophagus     last EGD and dilatation 3/09  . Fibromyalgia     sees Dr.Davenshwar  . Menopause     on lexapro was started by gyn  . Syncope 4/11    admitted thought to be due to over treatment of hypertension  . Vertigo     went to ED on 10/30  . Scoliosis   . Scoliosis    Past Surgical History  Procedure Laterality Date  . Breast lumpectomy  2000  . Knee surgery  1999/2002    left   . Knee arthroscopy  2003    right  . Shoulder surgery  2004  . Biopsy thyroid  01-2009    negative   . Tonsillectomy    . Colonoscopy     History   Social History  . Marital Status: Married    Spouse Name: N/A    Number of Children: 2  . Years of Education: N/A   Occupational History  . retired     Psychologist, forensic   Social History Main Topics  . Smoking status: Never Smoker   . Smokeless tobacco: Never Used  . Alcohol Use: Yes  . Drug Use: No  . Sexual Activity: Not on file   Other Topics Concern  . Not on file   Social History Narrative   Disable   Active- does some walking   Diet- doing nutrasystem           Review of Systems No fever chills, no weight loss. No tremors. Denies tenderness  at the neck   Objective:   Physical Exam  Neck:     BP 126/80  Pulse 69  Temp(Src) 98.8 F (37.1 C)  Wt 176 lb (79.833 kg)  BMI 26.77 kg/m2  SpO2 99%  General -- alert, well-developed, NAD.   Lungs -- normal respiratory effort, no intercostal retractions, no accessory muscle use, and normal breath sounds.  Heart-- normal rate, regular rhythm, no murmur.  Psych-- Cognition and judgment appear intact. Cooperative with normal attention span and concentration. No anxious appearing , no depressed appearing.       Assessment & Plan:  Chest pain-- went to the ER last month with chest pain, patient believes due to to stress, no further symptoms.

## 2013-01-05 ENCOUNTER — Telehealth: Payer: Self-pay | Admitting: Internal Medicine

## 2013-01-05 ENCOUNTER — Ambulatory Visit (HOSPITAL_COMMUNITY)
Admission: RE | Admit: 2013-01-05 | Discharge: 2013-01-05 | Disposition: A | Discharge status: Home / Self Care | Payer: BC Managed Care – PPO | Source: Ambulatory Visit | Attending: Internal Medicine | Admitting: Internal Medicine

## 2013-01-05 DIAGNOSIS — E049 Nontoxic goiter, unspecified: Secondary | ICD-10-CM

## 2013-01-05 DIAGNOSIS — E042 Nontoxic multinodular goiter: Secondary | ICD-10-CM | POA: Insufficient documentation

## 2013-01-05 NOTE — Telephone Encounter (Signed)
Advise pt  U/s confirmed the gland is slt larger and has more nodules, rec to see endocrinology (no urgent) Arrange a endocrinology referral-dx goiter ------- Report: 1. Findings compatible with multinodular goiter.  2. The previously biopsied dominant nodule within the  mid/posterior aspect of the left lobe of the thyroid has increased  in size in the interval, currently measuring 3.8 cm, previously,  2.1 cm. The thyroid isthmus nodule has increased in size in the  interval and there has been development of several new left-sided  thyroid nodules (labeled #1, 5 and 6). As such, percutaneous  sampling of these enlarging nodules and at least one of the new  left sided nodules may be performed as clinically indicated.

## 2013-01-05 NOTE — Telephone Encounter (Signed)
Patient went to have her ultrasound this morning. She states that she has some "travel decisions" to make for the weekend and is asking if we can call her with results before the end of the day whenever we get them back.

## 2013-01-05 NOTE — Telephone Encounter (Signed)
Referral ordered. Pt notfied via tele. DJR

## 2013-01-05 NOTE — Addendum Note (Signed)
Addended by: Eustace Quail on: 01/05/2013 01:33 PM   Modules accepted: Orders

## 2013-01-10 ENCOUNTER — Encounter: Payer: Self-pay | Admitting: Endocrinology

## 2013-01-10 ENCOUNTER — Ambulatory Visit (INDEPENDENT_AMBULATORY_CARE_PROVIDER_SITE_OTHER): Payer: BC Managed Care – PPO | Admitting: Endocrinology

## 2013-01-10 VITALS — BP 150/82 | HR 75 | Temp 98.3°F | Resp 12 | Ht 67.0 in | Wt 177.1 lb

## 2013-01-10 DIAGNOSIS — E042 Nontoxic multinodular goiter: Secondary | ICD-10-CM

## 2013-01-10 NOTE — Progress Notes (Signed)
Patient ID: Leah Cameron, female   DOB: 08-Jul-1950, 62 y.o.   MRN: [id number]  Reason for Appointment: Goiter , new visit    History of Present Illness:   The patient's thyroid enlargement was first discovered by her dentist about 4 years ago. She was then referred by her primary care physician for ultrasound. Ultrasound had shown a 2.1 cm nodule in the left side and this was biopsied. Because of some enlargement of her goiter she was again evaluated with an ultrasound  She has had difficulty with swallowing for several years and this is intermittent. She may use water to help her swallow and sometimes is unable to do so. Does not feel like she has any choking sensation in her neck or pressure in any position or when lying down.  Lab Results  Component Value Date   TSH 0.35 10/26/2011   She has had ultrasound exams in 2010 and 2014. Most recent ultrasound showed a 3.8 cm mixed echogenic left-sided nodule which was previously present and 3 newly diagnosed nodules in the left lobe. Her isthmus nodule is also increased in size. No unusual characteristics of the nodules are described in the ultrasound  Thyroid biopsy in 2010 showed:abundant colloid with admixed follicular epithelial cells, lymphocytes and blood. A non neoplastic process such as a non neoplastic goiter with cystic degeneration is favored   Past Medical History  Diagnosis Date  . GERD (gastroesophageal reflux disease)   . Hypertension   . Allergic rhinitis   . RLS (restless legs syndrome)   . Thyromegaly   . Back pain, chronic   . Stricture esophagus     last EGD and dilatation 3/09  . Fibromyalgia     sees Dr.Davenshwar  . Menopause     on lexapro was started by gyn  . Syncope 4/11    admitted thought to be due to over treatment of hypertension  . Vertigo     went to ED on 10/30  . Scoliosis   . Scoliosis     Past Surgical History  Procedure Laterality Date  . Breast lumpectomy  2000  . Knee surgery   1999/2002    left   . Knee arthroscopy  2003    right  . Shoulder surgery  2004  . Biopsy thyroid  01-2009    negative   . Tonsillectomy    . Colonoscopy      Family History  Problem Relation Age of Onset  . Colon cancer Neg Hx   . Breast cancer      1/2 sister  . Diabetes      aunt  . Coronary artery disease      GF  . Lung cancer      cousin  . Stroke Sister 53  . Stroke Mother   . Thyroid disease Mother    Her mother was followed for thyroid disease without treatment, probably a goiter   Social History:  reports that she has never smoked. She has never used smokeless tobacco. She reports that  drinks alcohol. She reports that she does not use illicit drugs.  Allergies:  Allergies  Allergen Reactions  . Cyclobenzaprine Hcl Other (See Comments)    Face swelling  . Flexeril [Cyclobenzaprine]   . Penicillins Swelling and Rash  . Tramadol Hcl Itching and Rash      Medication List       This list is accurate as of: 01/10/13  1:02 PM.  Always use your most recent med list.  AMBIEN 10 MG tablet  Generic drug:  zolpidem  Take 10 mg by mouth at bedtime as needed. For insomnia     amLODipine 10 MG tablet  Commonly known as:  NORVASC  TAKE 1 TABLET DAILY     aspirin 81 MG tablet  Take 81 mg by mouth daily.     benazepril-hydrochlorthiazide 20-25 MG per tablet  Commonly known as:  LOTENSIN HCT  Take 1 tablet by mouth daily.     CALTRATE 600+D 600-400 MG-UNIT per chew tablet  Generic drug:  Calcium Carbonate-Vitamin D  Chew 1 tablet by mouth 2 (two) times daily.     escitalopram 10 MG tablet  Commonly known as:  LEXAPRO  TAKE 1 TABLET DAILY     HYDROcodone-acetaminophen 5-500 MG per tablet  Commonly known as:  VICODIN  Take 1 tablet by mouth every 6 (six) hours as needed. For pain     lansoprazole 30 MG capsule  Commonly known as:  PREVACID  TAKE 1 CAPSULE BY MOUTH TWICE DAILY     LIDODERM 5 %  Generic drug:  lidocaine  Place 1 patch  onto the skin every 12 (twelve) hours. Remove & Discard patch within 12 hours or as directed by MD     multivitamin with minerals Tabs tablet  Take 1 tablet by mouth daily.     potassium chloride 10 MEQ tablet  Commonly known as:  K-DUR  Take 1 tablet (10 mEq total) by mouth 2 (two) times daily.     VITAMIN C PO  Take 1 tablet by mouth daily.        Review of Systems:  No history of weight loss CARDIOLOGY:  she has a  history of high blood pressure.She will occasionally experience palpitations for a few seconds, this has been going on for a few months              GASTROENTEROLOGY:  no Change in bowel habits.      ENDOCRINOLOGY:  no history of Diabetes.     She does have a history of fibromyalgia She does get tired easily, this is not new She does get hot flashes and sweating but also is heat intolerant Does not complain of shakiness of her hands   Examination:    BP 150/82  Pulse 75  Temp(Src) 98.3 F (36.8 C)  Resp 12  Ht 5\' 7"  (1.702 m)  Wt 177 lb 1.6 oz (80.332 kg)  BMI 27.73 kg/m2  SpO2 98%   General Appearance: pleasant, well-built and nourished          Eyes: No  prominence of her eyes or eyelid swelling .          Neck: The thyroid is enlarged bilaterally. The left side is enlarged about 3 times normal, smooth, rubbery. Also has a twice normal enlargement of the isthmus. Right-sided is slightly enlarged also but smooth. No distinct nodules felt. Neck circumference is 38.5 cm over the thyroid There is no stridor. Pemberton sign is negative but patient experiences mild choking on the left side on raising the arms There is no lymphadenopathy .    Cardiovascular: Normal  heart sounds, no murmur Respiratory:  Lungs clear Neurological: REFLEXES: at biceps are brisk. She does not have any tremors Skin: moist, warm, no rash        Assessment/Plan:  Multinodular goiter, long-standing with mostly a left-sided enlargement She has had a progressive increase in size of  the goiter over the last 4 years. Appears  to have increased nodule formation on the left lobe along with enlargement of the previously biopsied nodule. Thyroid levels in the past have shown low normal TSH levels  Since her previous biopsy showed mostly colloid and her exam reveals a relatively smooth and soft left lobe it is likely to be the same underlying pathology. She does have family history of goiter also  She may have subclinical hyperthyroidism and also has nonspecific symptoms of heat intolerance and palpitations.  Discussed with the patient the nature of multinodular goiter, natural history and treatment options Since she has had a biopsy proven benign nodule will not pursue another biopsy at this time but plan another ultrasound in one year Will check her thyroid functions and consider thyroid scan if TSH is low. Discussed a simple treatment with I-131 if she develops even subclinical hyperthyroidism Patient understands the above discussion and is agreeable to the above plan She understands that if she starts developing more pressure symptoms in her neck she would be referred for surgery   Griffon Herberg 01/10/2013, 1:02 PM

## 2013-01-15 ENCOUNTER — Telehealth: Payer: Self-pay | Admitting: Endocrinology

## 2013-01-15 NOTE — Telephone Encounter (Signed)
Please let patient know that thyroid is not overactive and no further treatment will be done. Needs to be scheduled for one-year followup appointment

## 2013-01-16 NOTE — Telephone Encounter (Signed)
Results given to patient

## 2013-04-20 ENCOUNTER — Other Ambulatory Visit: Payer: Self-pay | Admitting: Orthopaedic Surgery

## 2013-04-20 DIAGNOSIS — M545 Low back pain, unspecified: Secondary | ICD-10-CM

## 2013-04-25 ENCOUNTER — Ambulatory Visit
Admission: RE | Admit: 2013-04-25 | Discharge: 2013-04-25 | Disposition: A | Discharge status: Home / Self Care | Payer: BC Managed Care – PPO | Source: Ambulatory Visit | Attending: Orthopaedic Surgery | Admitting: Orthopaedic Surgery

## 2013-04-25 DIAGNOSIS — M48061 Spinal stenosis, lumbar region without neurogenic claudication: Secondary | ICD-10-CM | POA: Diagnosis not present

## 2013-04-25 DIAGNOSIS — M545 Low back pain, unspecified: Secondary | ICD-10-CM

## 2013-04-25 DIAGNOSIS — M5126 Other intervertebral disc displacement, lumbar region: Secondary | ICD-10-CM | POA: Diagnosis not present

## 2013-04-26 ENCOUNTER — Other Ambulatory Visit: Payer: Self-pay | Admitting: Internal Medicine

## 2013-05-09 ENCOUNTER — Encounter: Payer: Self-pay | Admitting: Internal Medicine

## 2013-05-09 ENCOUNTER — Ambulatory Visit (INDEPENDENT_AMBULATORY_CARE_PROVIDER_SITE_OTHER): Payer: BC Managed Care – PPO | Admitting: Internal Medicine

## 2013-05-09 VITALS — BP 122/70 | HR 83 | Temp 98.4°F | Wt 176.0 lb

## 2013-05-09 DIAGNOSIS — R05 Cough: Secondary | ICD-10-CM

## 2013-05-09 DIAGNOSIS — R059 Cough, unspecified: Secondary | ICD-10-CM | POA: Diagnosis not present

## 2013-05-09 DIAGNOSIS — I10 Essential (primary) hypertension: Secondary | ICD-10-CM

## 2013-05-09 LAB — POTASSIUM: POTASSIUM: 2.9 meq/L — AB (ref 3.5–5.1)

## 2013-05-09 MED ORDER — POTASSIUM CHLORIDE CRYS ER 10 MEQ PO TBCR: 20.0000 meq | EXTENDED_RELEASE_TABLET | Freq: Once | ORAL | Status: DC

## 2013-05-09 NOTE — Patient Instructions (Addendum)
Get your blood work before you leave   Next visit is for a physical exam in 3-4 months   fasting Please make an appointment     Rest, fluids , tylenol If  cough, take Mucinex DM twice a day as needed  For congestion use OTC Nasocort: 2 nasal sprays on each side of the nose daily until you feel better  Call if no better in few days Call anytime if the symptoms are severe, you have high fever, short of breath, chest pain

## 2013-05-09 NOTE — Progress Notes (Signed)
Pre visit review using our clinic review tool, if applicable. No additional management support is needed unless otherwise documented below in the visit note. 

## 2013-05-09 NOTE — Assessment & Plan Note (Addendum)
Well-controlled, persisting hypokalemia, after last low potassium took KCL for few days. Plan:  Check a potassium level, will see that we've the patient on potassium permanently.  Addendum, potassium 2.9, rx for  KCL 10 mEq 2 tablets daily sent, pt called, will come back in 3 weeks for blood work only.

## 2013-05-09 NOTE — Progress Notes (Signed)
   Subjective:    Patient ID: Leah Cameron, female    DOB: [birth year], 63 y.o.   MRN: [id number]  HPI Acute visit Few days history of a scratchy throat, cough, runny nose, sinus congestion, subjective fever. She was exposed to strep throat, currently she reports no sore throat.   Past Medical History  Diagnosis Date  . GERD (gastroesophageal reflux disease)   . Hypertension   . Allergic rhinitis   . RLS (restless legs syndrome)   . Thyromegaly   . Back pain, chronic   . Stricture esophagus     last EGD and dilatation 3/09  . Fibromyalgia     sees Dr.Davenshwar  . Menopause     on lexapro was started by gyn  . Syncope 4/11    admitted thought to be due to over treatment of hypertension  . Vertigo     went to ED on 10/30  . Scoliosis   . Scoliosis    Past Surgical History  Procedure Laterality Date  . Breast lumpectomy  2000  . Knee surgery  1999/2002    left   . Knee arthroscopy  2003    right  . Shoulder surgery  2004  . Biopsy thyroid  01-2009    negative   . Tonsillectomy    . Colonoscopy       Review of Systems  Mild nausea, no vomiting or diarrhea No chest pain or difficulty breathing.  Objective:   Physical Exam BP 122/70  Pulse 83  Temp(Src) 98.4 F (36.9 C)  Wt 176 lb (79.833 kg)  SpO2 97% General -- alert, well-developed, NAD.  HEENT-- Not pale. TMs normal, throat symmetric, no redness or discharge. Face symmetric, sinuses not tender to palpation. Nose slt congested.  Lungs -- normal respiratory effort, no intercostal retractions, no accessory muscle use, and normal breath sounds.  Heart-- normal rate, regular rhythm, no murmur.   Neurologic--  alert & oriented X3. Speech normal, gait normal, strength normal in all extremities.  Psych-- Cognition and judgment appear intact. Cooperative with normal attention span and concentration. No anxious or depressed appearing      Assessment & Plan:  URI Strep A

## 2013-05-14 ENCOUNTER — Telehealth: Payer: Self-pay | Admitting: Internal Medicine

## 2013-05-14 NOTE — Telephone Encounter (Signed)
Relevant patient education mailed to patient.  

## 2013-05-31 ENCOUNTER — Other Ambulatory Visit (INDEPENDENT_AMBULATORY_CARE_PROVIDER_SITE_OTHER): Payer: BC Managed Care – PPO

## 2013-05-31 DIAGNOSIS — I10 Essential (primary) hypertension: Secondary | ICD-10-CM

## 2013-05-31 LAB — POTASSIUM: POTASSIUM: 3 meq/L — AB (ref 3.5–5.1)

## 2013-06-01 ENCOUNTER — Telehealth: Payer: Self-pay

## 2013-06-01 MED ORDER — BENAZEPRIL HCL 40 MG PO TABS: 40.0000 mg | ORAL_TABLET | Freq: Every day | ORAL | Status: DC

## 2013-06-01 NOTE — Addendum Note (Signed)
Addended by: Kathlene November E on: 06/01/2013 01:09 PM   Modules accepted: Orders, Medications

## 2013-06-01 NOTE — Telephone Encounter (Addendum)
Potassium continued to be low lkely because the diuretic that she takes with the benazepril HCT.  Please call the patient, plan---- Discontinue benazepril HCT Discontinue all potassium supplements Start benazepril 40 mg one by mouth daily Continue checking her BPs, if not well controlled let me know BMP in one month. I already sent the prescription

## 2013-06-01 NOTE — Telephone Encounter (Signed)
The patient called and is hoping to get a call back to discuss her medications from the [facility]

## 2013-06-01 NOTE — Telephone Encounter (Signed)
Pt called regarding her potassium - done 05/31/13 (3.0)  Pt wants to know if she needs to continue with same dose. Please advise.

## 2013-06-01 NOTE — Telephone Encounter (Signed)
Pt notified, agrees with plan will let us know if b/p is not well-controlled. Will call back to schedule lab visit

## 2013-06-20 ENCOUNTER — Telehealth: Payer: Self-pay | Admitting: Internal Medicine

## 2013-06-20 DIAGNOSIS — I10 Essential (primary) hypertension: Secondary | ICD-10-CM

## 2013-06-20 NOTE — Telephone Encounter (Signed)
Patient called to schedule an lab apt to repeat labs. She schedule one for March 24th. Please advise with orders

## 2013-06-20 NOTE — Telephone Encounter (Signed)
Bmp -ordered future status.

## 2013-07-03 ENCOUNTER — Telehealth: Payer: Self-pay | Admitting: Lab

## 2013-07-03 ENCOUNTER — Other Ambulatory Visit: Payer: BC Managed Care – PPO

## 2013-07-03 NOTE — Telephone Encounter (Signed)
Pt reported blood pressure taking at random between March 6th and March 19th:  March 6-  112/75                                   108/64                                   135/81                   129/79                   110/67                   130/75                   120/81          March 19- 135/80

## 2013-07-09 NOTE — Telephone Encounter (Signed)
Recently we changed her BP medication. Blood pressure under good control. Labs from 06/27/2013 at rheumatology: Potassium 3.5, creatinine 0.9, LFTs normal, CBC normal. Plan: Continue with current treatment

## 2013-07-22 ENCOUNTER — Other Ambulatory Visit: Payer: Self-pay | Admitting: Internal Medicine

## 2013-07-29 ENCOUNTER — Other Ambulatory Visit: Payer: Self-pay | Admitting: Internal Medicine

## 2013-07-30 MED ORDER — AMLODIPINE BESYLATE 10 MG PO TABS: ORAL_TABLET | ORAL | Status: DC

## 2013-07-30 MED ORDER — ESCITALOPRAM OXALATE 10 MG PO TABS: ORAL_TABLET | ORAL | Status: DC

## 2013-07-30 NOTE — Addendum Note (Signed)
Addended by: Peggyann Shoals on: 07/30/2013 02:38 PM   Modules accepted: Orders

## 2013-08-07 ENCOUNTER — Telehealth: Payer: Self-pay

## 2013-08-07 NOTE — Telephone Encounter (Signed)
Medication and allergies:  Reviewed and updated  90 day supply/mail order: [facility] Local pharmacy: [facility] at [address]   Immunizations due:  shingles  A/P:   No changes to FH, PSH or Personal Hx Flu vaccine--12/2012 Tdap--10/2007 Shingles--due Pap--03/2012--neg MMG--09/2012--no abnormal findings Bone Density--09/2009--Dr Carlota Raspberry [facility]--02/2012--Dr kaplan--neg--next 2023  To Discuss with Provider: Surgery at [facility] with neuro surgeon

## 2013-08-08 ENCOUNTER — Encounter: Payer: Self-pay | Admitting: Internal Medicine

## 2013-08-08 ENCOUNTER — Ambulatory Visit (INDEPENDENT_AMBULATORY_CARE_PROVIDER_SITE_OTHER): Payer: BC Managed Care – PPO | Admitting: Internal Medicine

## 2013-08-08 VITALS — BP 125/85 | HR 71 | Temp 98.0°F | Ht 68.2 in | Wt 179.0 lb

## 2013-08-08 DIAGNOSIS — I10 Essential (primary) hypertension: Secondary | ICD-10-CM

## 2013-08-08 DIAGNOSIS — E876 Hypokalemia: Secondary | ICD-10-CM

## 2013-08-08 DIAGNOSIS — E042 Nontoxic multinodular goiter: Secondary | ICD-10-CM

## 2013-08-08 DIAGNOSIS — M549 Dorsalgia, unspecified: Secondary | ICD-10-CM

## 2013-08-08 DIAGNOSIS — Z2911 Encounter for prophylactic immunotherapy for respiratory syncytial virus (RSV): Secondary | ICD-10-CM

## 2013-08-08 DIAGNOSIS — Z23 Encounter for immunization: Secondary | ICD-10-CM

## 2013-08-08 DIAGNOSIS — Z0000 Encounter for general adult medical examination without abnormal findings: Secondary | ICD-10-CM

## 2013-08-08 LAB — BASIC METABOLIC PANEL
BUN: 16 mg/dL (ref 6–23)
CALCIUM: 9.6 mg/dL (ref 8.4–10.5)
CO2: 29 mEq/L (ref 19–32)
Chloride: 104 mEq/L (ref 96–112)
Creatinine, Ser: 0.8 mg/dL (ref 0.4–1.2)
GFR: 93.49 mL/min (ref >60.00)
GLUCOSE: 90 mg/dL (ref 70–99)
Potassium: 3.2 mEq/L — ABNORMAL LOW (ref 3.5–5.1)
SODIUM: 139 meq/L (ref 135–145)

## 2013-08-08 LAB — HEMOGLOBIN A1C: Hgb A1c MFr Bld: 5.7 % (ref 4.6–6.5)

## 2013-08-08 NOTE — Assessment & Plan Note (Signed)
Due to see Dr. Dwyane Dee [phone]

## 2013-08-08 NOTE — Assessment & Plan Note (Addendum)
Td 7-09 zostavax  discussed , will provided   Female care per gyn, Dr Leo Grosser Pap--03/2012--neg  MMG--09/2012--no abnormal findings   Colonoscopy:  01/10/2002 and 10- 2013, neg , 10 years  Self discontinue aspirin d/t easy bruising. Denies actual bleeding  DEXAs  Per Rheumatology   Diet   Discussed

## 2013-08-08 NOTE — Progress Notes (Signed)
Pre visit review using our clinic review tool, if applicable. No additional management support is needed unless otherwise documented below in the visit note. 

## 2013-08-08 NOTE — Assessment & Plan Note (Addendum)
Patient was referred to neurosurgery by her rheumatologist, she saw our local neurosurgeon and is seeking the opinion of another physician at [facility], she is looking for the least invasive procedure possible

## 2013-08-08 NOTE — Assessment & Plan Note (Addendum)
Seems  well-controlled, last BMP 06-2013 Plan: No change, recheck in 6 months

## 2013-08-08 NOTE — Patient Instructions (Signed)
Get your blood work before you leave   You are due to see Dr Dwyane Dee regards your thyroid by [phone]  Next visit is for routine check up regards your blood sugar  in 6 months  No need to come back fasting Please make an appointment

## 2013-08-08 NOTE — Progress Notes (Signed)
Subjective:    Patient ID: Leah Cameron, female    DOB: Aug 09, 1950, 63 y.o.   MRN: [id number]  DOS:  08/08/2013 Type of  visit:  CPX  ROS Diet-- healthy Exercise-- not active d/t back pain  No  CP, SOB No palpitations, no lower extremity edema Denies  nausea, vomiting diarrhea Denies  blood in the stools (-) cough, sputum production (-) wheezing, chest congestion No dysuria, gross hematuria, difficulty urinating  No anxiety, depression Denies suicidal ideas     Past Medical History  Diagnosis Date  . GERD (gastroesophageal reflux disease)   . Hypertension   . Allergic rhinitis   . RLS (restless legs syndrome)   . Thyromegaly   . Back pain, chronic   . Stricture esophagus     last EGD and dilatation 3/09  . Fibromyalgia     sees Dr.Davenshwar  . Menopause     on lexapro was started by gyn  . Syncope 4/11    admitted thought to be due to over treatment of hypertension  . Vertigo     went to ED on 10/30  . Scoliosis     Past Surgical History  Procedure Laterality Date  . Breast lumpectomy  2000  . Knee surgery  1999/2002    left   . Knee arthroscopy  2003    right  . Shoulder surgery  2004  . Biopsy thyroid  01-2009    negative   . Tonsillectomy    . Colonoscopy      History   Social History  . Marital Status: Married    Spouse Name: N/A    Number of Children: 2  . Years of Education: N/A   Occupational History  .  Retired 2001, Public relations account executive, disable      Social History Main Topics  . Smoking status: Never Smoker   . Smokeless tobacco: Never Used  . Alcohol Use: Yes     Comment: socially   . Drug Use: No  . Sexual Activity: Not on file   Other Topics Concern  . Not on file   Social History Narrative   Disable                  Family History  Problem Relation Age of Onset  . Colon cancer Neg Hx   . Breast cancer Other     1/2 sister  . Diabetes Other     aunt  . Coronary artery disease Other     GF  . Lung  cancer Other     cousin  . Stroke Mother     M, F, 1/2 sister, sister   . Thyroid disease Mother        Medication List       This list is accurate as of: 08/08/13  6:11 PM.  Always use your most recent med list.               AMBIEN 10 MG tablet  Generic drug:  zolpidem  Take 10 mg by mouth at bedtime as needed. For insomnia     amLODipine 10 MG tablet  Commonly known as:  NORVASC  Take 1 tablet daily.     aspirin 81 MG tablet  Take 81 mg by mouth daily.     benazepril 40 MG tablet  Commonly known as:  LOTENSIN  TAKE 1 TABLET BY MOUTH EVERY DAY     CALTRATE 600+D 600-400 MG-UNIT per chew tablet  Generic drug:  Calcium Carbonate-Vitamin D  Chew 1 tablet by mouth 2 (two) times daily.     escitalopram 10 MG tablet  Commonly known as:  LEXAPRO  Take 1 tablet daily.     lansoprazole 30 MG capsule  Commonly known as:  PREVACID  TAKE 1 CAPSULE BY MOUTH TWICE DAILY     LIDODERM 5 %  Generic drug:  lidocaine  Place 1 patch onto the skin every 12 (twelve) hours. Remove & Discard patch within 12 hours or as directed by MD     multivitamin with minerals Tabs tablet  Take 1 tablet by mouth daily.     VITAMIN C PO  Take 1 tablet by mouth daily.           Objective:   Physical Exam BP 125/85  Pulse 71  Temp(Src) 98 F (36.7 C)  Ht 5' 8.2" (1.732 m)  Wt 179 lb (81.194 kg)  BMI 27.07 kg/m2  SpO2 97% General -- alert, well-developed, NAD.  Neck --non tender or nodular  Thyromegaly  HEENT-- Not pale. Lungs -- normal respiratory effort, no intercostal retractions, no accessory muscle use, and normal breath sounds.  Heart-- normal rate, regular rhythm, no murmur.  Abdomen-- Not distended, good bowel sounds,soft, non-tender. Extremities-- no pretibial edema bilaterally  Neurologic--  alert & oriented X3. Speech normal, gait normal, strength normal in all extremities.  Psych-- Cognition and judgment appear intact. Cooperative with normal attention span and  concentration. No anxious or depressed appearing.      Assessment & Plan:

## 2013-08-13 NOTE — Addendum Note (Signed)
Addended by: Peggyann Shoals on: 08/13/2013 10:50 AM   Modules accepted: Orders

## 2013-08-14 DIAGNOSIS — M4310 Spondylolisthesis, site unspecified: Secondary | ICD-10-CM | POA: Insufficient documentation

## 2013-09-05 DIAGNOSIS — M4310 Spondylolisthesis, site unspecified: Secondary | ICD-10-CM | POA: Diagnosis present

## 2013-09-05 DIAGNOSIS — K219 Gastro-esophageal reflux disease without esophagitis: Secondary | ICD-10-CM | POA: Diagnosis present

## 2013-09-05 DIAGNOSIS — G8929 Other chronic pain: Secondary | ICD-10-CM | POA: Diagnosis present

## 2013-09-05 DIAGNOSIS — I10 Essential (primary) hypertension: Secondary | ICD-10-CM | POA: Diagnosis present

## 2013-09-05 DIAGNOSIS — F3289 Other specified depressive episodes: Secondary | ICD-10-CM | POA: Diagnosis present

## 2013-09-05 DIAGNOSIS — F329 Major depressive disorder, single episode, unspecified: Secondary | ICD-10-CM | POA: Diagnosis present

## 2013-09-05 DIAGNOSIS — G4700 Insomnia, unspecified: Secondary | ICD-10-CM | POA: Diagnosis present

## 2013-09-05 DIAGNOSIS — M48062 Spinal stenosis, lumbar region with neurogenic claudication: Secondary | ICD-10-CM | POA: Diagnosis present

## 2013-09-05 HISTORY — PX: BACK SURGERY: SHX140

## 2013-09-18 ENCOUNTER — Encounter: Payer: Self-pay | Admitting: Internal Medicine

## 2013-09-18 ENCOUNTER — Ambulatory Visit (INDEPENDENT_AMBULATORY_CARE_PROVIDER_SITE_OTHER): Payer: BC Managed Care – PPO | Admitting: Internal Medicine

## 2013-09-18 VITALS — BP 140/70 | HR 85 | Temp 97.9°F | Wt 186.0 lb

## 2013-09-18 DIAGNOSIS — I10 Essential (primary) hypertension: Secondary | ICD-10-CM

## 2013-09-18 DIAGNOSIS — M549 Dorsalgia, unspecified: Secondary | ICD-10-CM

## 2013-09-18 MED ORDER — HYDROQUINONE MICROSPHERES 4 % EX CREA: TOPICAL_CREAM | CUTANEOUS | Status: DC

## 2013-09-18 NOTE — Progress Notes (Signed)
Pre visit review using our clinic review tool, if applicable. No additional management support is needed unless otherwise documented below in the visit note. 

## 2013-09-18 NOTE — Assessment & Plan Note (Addendum)
BP slightly elevated upon arrival but it was retaken and is 140/70, normal ambulatory BPs. Plan: No change  Also found to have hypokalemia, referral to nephrology pending (We  checked on the referral today, she should be seen within few months which is okay)

## 2013-09-18 NOTE — Assessment & Plan Note (Signed)
Had surgery at [facility] last month, no evidence of wound infection, to call if redness, F/C

## 2013-09-18 NOTE — Progress Notes (Signed)
Subjective:    Patient ID: Leah Cameron, female    DOB: 1950-04-26, 63 y.o.   MRN: [id number]  DOS:  09/18/2013 Type of  Visit: acute History:  s/p back surgery at [facility] last month, she was given the option to go back there or  come here to be sure the wounds are not infected. She elected to come here . BP was noted to be slightly elevated, ambulatory BPs have been great at 128, 130/78. Request a RF on epiquin   ROS Denies fever chills. No discharge from the site of surgery, back pain has definitely improved. Good compliance with all medications  Past Medical History  Diagnosis Date  . GERD (gastroesophageal reflux disease)   . Hypertension   . Allergic rhinitis   . RLS (restless legs syndrome)   . Thyromegaly   . Back pain, chronic   . Stricture esophagus     last EGD and dilatation 3/09  . Fibromyalgia     sees Dr.Davenshwar  . Menopause     on lexapro was started by gyn  . Syncope 4/11    admitted thought to be due to over treatment of hypertension  . Vertigo     went to ED on 10/30  . Scoliosis     Past Surgical History  Procedure Laterality Date  . Breast lumpectomy  2000  . Knee surgery  1999/2002    left   . Knee arthroscopy  2003    right  . Shoulder surgery  2004  . Biopsy thyroid  01-2009    negative   . Tonsillectomy    . Colonoscopy    . Back surgery  09-05-13    cage and rods L4 L5 (@ [facility])    History   Social History  . Marital Status: Married    Spouse Name: N/A    Number of Children: 2  . Years of Education: N/A   Occupational History  .  Retired 2001, Public relations account executive, disable      Social History Main Topics  . Smoking status: Never Smoker   . Smokeless tobacco: Never Used  . Alcohol Use: Yes     Comment: socially   . Drug Use: No  . Sexual Activity: Not on file   Other Topics Concern  . Not on file   Social History Narrative   Disable                     Medication List       This list is  accurate as of: 09/18/13  7:29 PM.  Always use your most recent med list.               AMBIEN 10 MG tablet  Generic drug:  zolpidem  Take 10 mg by mouth at bedtime as needed. For insomnia     amLODipine 10 MG tablet  Commonly known as:  NORVASC  Take 1 tablet daily.     aspirin 81 MG tablet  Take 81 mg by mouth daily.     benazepril 40 MG tablet  Commonly known as:  LOTENSIN  TAKE 1 TABLET BY MOUTH EVERY DAY     CALTRATE 600+D 600-400 MG-UNIT per chew tablet  Generic drug:  Calcium Carbonate-Vitamin D  Chew 1 tablet by mouth 2 (two) times daily.     escitalopram 10 MG tablet  Commonly known as:  LEXAPRO  Take 1 tablet daily.     Hydroquinone Microspheres 4 %  Crea  Use daily as needed, next RF per derm     lansoprazole 30 MG capsule  Commonly known as:  PREVACID  TAKE 1 CAPSULE BY MOUTH TWICE DAILY     LIDODERM 5 %  Generic drug:  lidocaine  Place 1 patch onto the skin every 12 (twelve) hours. Remove & Discard patch within 12 hours or as directed by MD     multivitamin with minerals Tabs tablet  Take 1 tablet by mouth daily.     oxyCODONE-acetaminophen 10-325 MG per tablet  Commonly known as:  PERCOCET  Take 1 tablet by mouth every 6 (six) hours as needed for pain.     VITAMIN C PO  Take 1 tablet by mouth daily.           Objective:   Physical Exam BP 140/70  Pulse 85  Temp(Src) 97.9 F (36.6 C)  Wt 186 lb (84.369 kg)  SpO2 98% General -- alert, well-developed, NAD.   HEENT-- face w/ few hyperpigmented lesions  Back--  3 post surgical incisions are free of swelling, redness, discharge. extremities-- no pretibial edema bilaterally  Neurologic--  alert & oriented X3.   Psych-- Cognition and judgment appear intact. Cooperative with normal attention span and concentration. No anxious or depressed appearing.        Assessment & Plan:   Skin hyperpigmentation: In the past Dr. Sherral Hammers prescribed Epiquin, request a refill.  will prescribe 1 tube but  recommend to see dermatology again

## 2013-09-20 ENCOUNTER — Other Ambulatory Visit: Payer: Self-pay | Admitting: Internal Medicine

## 2013-10-22 ENCOUNTER — Other Ambulatory Visit: Payer: Self-pay

## 2013-10-22 DIAGNOSIS — Z1231 Encounter for screening mammogram for malignant neoplasm of breast: Secondary | ICD-10-CM

## 2013-10-23 ENCOUNTER — Ambulatory Visit
Admission: RE | Admit: 2013-10-23 | Discharge: 2013-10-23 | Disposition: A | Discharge status: Home / Self Care | Payer: BC Managed Care – PPO | Source: Ambulatory Visit

## 2013-10-23 DIAGNOSIS — Z1231 Encounter for screening mammogram for malignant neoplasm of breast: Secondary | ICD-10-CM

## 2013-10-31 ENCOUNTER — Other Ambulatory Visit: Payer: Self-pay | Admitting: Internal Medicine

## 2013-11-22 ENCOUNTER — Telehealth: Payer: Self-pay

## 2013-11-22 NOTE — Telephone Encounter (Signed)
Caller name:Zorana Relation to pt: Call back [phone] home [phone] cell Pharmacy:  Reason for call:Marline called to see if we could do a referral for her to go to the [facility] and have a Bone Density Test Call her when referral has been sent.

## 2013-11-22 NOTE — Telephone Encounter (Signed)
Advised patient that we would be glad to enter referral but that rhumatology has followed this in the past. Patient will call [facility].

## 2013-11-28 LAB — HM DEXA SCAN

## 2013-12-18 ENCOUNTER — Encounter: Payer: Self-pay | Admitting: Internal Medicine

## 2013-12-20 ENCOUNTER — Telehealth: Payer: Self-pay | Admitting: Internal Medicine

## 2013-12-20 NOTE — Telephone Encounter (Signed)
°  Caller name: Sharyn Lull  Relation to pt: other / [facility] Call back number: [phone]   Reason for call:   Please be advised pt has schedule new patient consultation for 12/25/13 at 1:15pm with Dr. Marylou Flesher

## 2013-12-20 NOTE — Telephone Encounter (Signed)
FYI

## 2013-12-21 NOTE — Telephone Encounter (Signed)
Thanks

## 2014-01-15 ENCOUNTER — Other Ambulatory Visit: Payer: Self-pay | Admitting: Internal Medicine

## 2014-02-08 ENCOUNTER — Telehealth: Payer: Self-pay | Admitting: Internal Medicine

## 2014-02-08 MED ORDER — AMLODIPINE BESYLATE 5 MG PO TABS: 5.0000 mg | ORAL_TABLET | Freq: Every day | ORAL | Status: DC

## 2014-02-08 NOTE — Telephone Encounter (Signed)
Amlodipine 10 mg changed to 5 mg as requested by Pt. Medication sent to Pharmacy.

## 2014-02-08 NOTE — Telephone Encounter (Signed)
Caller name: Itati   Call back number: [phone] Pharmacy: [facility]  Reason for call:  Pt states that her kidney doctor decreased her Rx amLODipine (NORVASC) 10 MG tablet down to 5mg .  She is needing refill on this Rx as well.  90 day supply

## 2014-02-27 ENCOUNTER — Other Ambulatory Visit: Payer: Self-pay | Admitting: Internal Medicine

## 2014-02-27 ENCOUNTER — Telehealth: Payer: Self-pay | Admitting: Internal Medicine

## 2014-02-27 MED ORDER — ESCITALOPRAM OXALATE 10 MG PO TABS: ORAL_TABLET | ORAL | Status: DC

## 2014-02-27 NOTE — Telephone Encounter (Signed)
Lexapro refilled for 90 days, please inform Pt she is due for OV with Dr. Larose Kells for any further refills.

## 2014-02-27 NOTE — Telephone Encounter (Signed)
Caller name: Adalea, Handler Relation to pt: self  Call back number: [phone] Pharmacy: [facility]  [phone]   Reason for call:  Pt requesting a refill escitalopram (LEXAPRO) 10 MG tablet. Please send to retail

## 2014-02-28 ENCOUNTER — Ambulatory Visit: Payer: BC Managed Care – PPO

## 2014-02-28 ENCOUNTER — Ambulatory Visit (INDEPENDENT_AMBULATORY_CARE_PROVIDER_SITE_OTHER): Payer: BC Managed Care – PPO

## 2014-02-28 DIAGNOSIS — Z23 Encounter for immunization: Secondary | ICD-10-CM

## 2014-03-01 NOTE — Telephone Encounter (Signed)
Pt scheduled follow up 05/03/2014 and CPE 08/14/2014.

## 2014-03-13 ENCOUNTER — Other Ambulatory Visit: Payer: Self-pay

## 2014-04-22 ENCOUNTER — Other Ambulatory Visit: Payer: Self-pay | Admitting: Internal Medicine

## 2014-05-03 ENCOUNTER — Ambulatory Visit: Payer: BC Managed Care – PPO | Admitting: Internal Medicine

## 2014-05-17 ENCOUNTER — Encounter: Payer: Self-pay | Admitting: Internal Medicine

## 2014-05-17 ENCOUNTER — Other Ambulatory Visit: Payer: Self-pay

## 2014-05-17 ENCOUNTER — Ambulatory Visit (INDEPENDENT_AMBULATORY_CARE_PROVIDER_SITE_OTHER): Payer: BLUE CROSS/BLUE SHIELD | Admitting: Internal Medicine

## 2014-05-17 VITALS — BP 123/81 | HR 100 | Temp 98.1°F | Ht 68.0 in | Wt 185.4 lb

## 2014-05-17 DIAGNOSIS — Z13220 Encounter for screening for lipoid disorders: Secondary | ICD-10-CM

## 2014-05-17 DIAGNOSIS — M797 Fibromyalgia: Secondary | ICD-10-CM | POA: Diagnosis not present

## 2014-05-17 DIAGNOSIS — I10 Essential (primary) hypertension: Secondary | ICD-10-CM

## 2014-05-17 DIAGNOSIS — E042 Nontoxic multinodular goiter: Secondary | ICD-10-CM | POA: Diagnosis not present

## 2014-05-17 MED ORDER — BENAZEPRIL HCL 40 MG PO TABS: ORAL_TABLET | ORAL | Status: DC

## 2014-05-17 MED ORDER — ZOLPIDEM TARTRATE 10 MG PO TABS: 10.0000 mg | ORAL_TABLET | Freq: Every evening | ORAL | Status: DC | PRN

## 2014-05-17 MED ORDER — ESCITALOPRAM OXALATE 10 MG PO TABS: ORAL_TABLET | ORAL | Status: DC

## 2014-05-17 MED ORDER — LANSOPRAZOLE 30 MG PO CPDR: 30.0000 mg | DELAYED_RELEASE_CAPSULE | Freq: Two times a day (BID) | ORAL | Status: DC

## 2014-05-17 MED ORDER — AMLODIPINE BESYLATE 5 MG PO TABS: 5.0000 mg | ORAL_TABLET | Freq: Every day | ORAL | Status: DC

## 2014-05-17 MED ORDER — HYDROCORTISONE 2.5 % EX CREA: TOPICAL_CREAM | Freq: Two times a day (BID) | CUTANEOUS | Status: DC

## 2014-05-17 NOTE — Assessment & Plan Note (Signed)
Followed by rheumatology, currently she is doing well and was release from Dr. Herold Harms care. She however present with shoulder pain, I offered a referral to sports medicine but she prefers to consult with Dr.Davenshwar

## 2014-05-17 NOTE — Progress Notes (Signed)
Subjective:    Patient ID: Leah Cameron, female    DOB: [birth year], 63 y.o.   MRN: [id number]  DOS:  05/17/2014 Type of visit - description : rov Interval history:   In general feeling well. Needs multiple refills. Good compliance with medications. Complaining of a mild to moderate left shoulder pain, this is going on for a while. No neck pain per se. Also sometimes the right arm gets a slightly numb depending on her position. No recent ambulatory BPs but when checked at other doctor's offices is usually 114/70. History of hypokalemia, notes from nephrology reviewed Has a skin lesion on the  left neck for few weeks.   Review of Systems  Denies chest pain or difficulty breathing No nausea, vomiting, diarrhea   Past Medical History  Diagnosis Date  . GERD (gastroesophageal reflux disease)   . Hypertension   . Allergic rhinitis   . RLS (restless legs syndrome)   . Thyromegaly   . Back pain, chronic   . Stricture esophagus     last EGD and dilatation 3/09  . Fibromyalgia     sees Dr.Davenshwar  . Menopause     on lexapro was started by gyn  . Syncope 4/11    admitted thought to be due to over treatment of hypertension  . Vertigo     went to ED on 10/30  . Scoliosis     Past Surgical History  Procedure Laterality Date  . Breast lumpectomy  2000  . Knee surgery  1999/2002    left   . Knee arthroscopy  2003    right  . Shoulder surgery  2004  . Biopsy thyroid  01-2009    negative   . Tonsillectomy    . Colonoscopy    . Back surgery  09-05-13    cage and rods L4 L5 (@ [facility])    History   Social History  . Marital Status: Married    Spouse Name: N/A    Number of Children: 2  . Years of Education: N/A   Occupational History  .  Retired 2001, Public relations account executive, disable      Social History Main Topics  . Smoking status: Never Smoker   . Smokeless tobacco: Never Used  . Alcohol Use: Yes     Comment: socially   . Drug Use: No  . Sexual Activity:  Not on file   Other Topics Concern  . Not on file   Social History Narrative   Disable                     Medication List       This list is accurate as of: 05/17/14 11:59 PM.  Always use your most recent med list.               amLODipine 5 MG tablet  Commonly known as:  NORVASC  Take 1 tablet (5 mg total) by mouth daily.     aspirin 81 MG tablet  Take 81 mg by mouth daily.     benazepril 40 MG tablet  Commonly known as:  LOTENSIN  Take 1 tablet daily.     CALTRATE 600+D 600-400 MG-UNIT per chew tablet  Generic drug:  Calcium Carbonate-Vitamin D  Chew 1 tablet by mouth 2 (two) times daily.     escitalopram 10 MG tablet  Commonly known as:  LEXAPRO  Take 1 tablet daily.     hydrocortisone 2.5 % cream  Apply topically  2 (two) times daily.     Hydroquinone Microspheres 4 % Crea  Use daily as needed, next RF per derm     lansoprazole 30 MG capsule  Commonly known as:  PREVACID  Take 1 capsule (30 mg total) by mouth 2 (two) times daily.     LIDODERM 5 %  Generic drug:  lidocaine  Place 1 patch onto the skin every 12 (twelve) hours. Remove & Discard patch within 12 hours or as directed by MD     multivitamin with minerals Tabs tablet  Take 1 tablet by mouth daily.     VITAMIN C PO  Take 1 tablet by mouth daily.     zolpidem 10 MG tablet  Commonly known as:  AMBIEN  Take 1 tablet (10 mg total) by mouth at bedtime as needed. For insomnia           Objective:   Physical Exam  Constitutional: She is oriented to person, place, and time. She appears well-developed. No distress.  HENT:  Head: Normocephalic and atraumatic.  Neck:    Cardiovascular:     Pulmonary/Chest:     Musculoskeletal: She exhibits no edema or tenderness.  Right shoulder normal. Left shoulder without deformities, range of motion is a slightly decrease due to pain with motion.  Neurological: She is alert and oriented to person, place, and time. She displays normal reflexes.  No cranial nerve deficit. She exhibits normal muscle tone. Coordination normal.  Speech normal, gait unassisted and normal for age, motor strength appropriate for age   Skin: Skin is warm and dry. No pallor.  No jaundice  Psychiatric: She has a normal mood and affect. Her behavior is normal. Judgment and thought content normal.  Vitals reviewed.        Assessment & Plan:   Multiple medications refilled Needs a cholesterol panel Rash at the neck: Postinflammatory hyperpigmentation? Recommend a trial with hydrocortisone 2.5%. See instructions   Problem List Items Addressed This Visit      Cardiovascular and Mediastinum   Essential hypertension--hypokalemia, question of primary hyperaldosteronism    Was seen by nephrology for hypokalemia, question of hyperaldosteronism, last visit one month ago, she was released from their care. Currently baby is very good. Continue with present care      Relevant Medications   benazepril (LOTENSIN) tablet   amLODIpine (NORVASC) tablet     Endocrine   Multinodular goiter    Due to see Dr. Dwyane Dee, will arrange        Musculoskeletal and Integument   Fibromyalgia    Followed by rheumatology, currently she is doing well and was release from Dr. Herold Harms care. She however present with shoulder pain, I offered a referral to sports medicine but she prefers to consult with Dr.Davenshwar       Other Visit Diagnoses    Screening for hyperlipidemia    -  Primary    Relevant Orders    Lipid panel

## 2014-05-17 NOTE — Assessment & Plan Note (Addendum)
Was seen by nephrology for hypokalemia, question of hyperaldosteronism,   was released from their care. Currently BP is very good. Continue with present care

## 2014-05-17 NOTE — Patient Instructions (Signed)
Stop by the front desk and schedule labs to be done within few days (fasting)  Use the cream I sent to your pharmacy twice a day for 10 days, if the area is not improving let me know or call your dermatologist   Please come back to the office by 07-2014  for a physical exam. Come back fasting

## 2014-05-17 NOTE — Assessment & Plan Note (Addendum)
Due to see Dr. Dwyane Dee, will arrange

## 2014-05-17 NOTE — Progress Notes (Signed)
Pre visit review using our clinic review tool, if applicable. No additional management support is needed unless otherwise documented below in the visit note. 

## 2014-05-20 ENCOUNTER — Other Ambulatory Visit (INDEPENDENT_AMBULATORY_CARE_PROVIDER_SITE_OTHER): Payer: BLUE CROSS/BLUE SHIELD

## 2014-05-20 DIAGNOSIS — Z13220 Encounter for screening for lipoid disorders: Secondary | ICD-10-CM

## 2014-05-20 LAB — LIPID PANEL
Cholesterol: 158 mg/dL (ref 0–200)
HDL: 42.9 mg/dL (ref >39.00)
LDL Cholesterol: 90 mg/dL (ref 0–99)
NonHDL: 115.1
Total CHOL/HDL Ratio: 4
Triglycerides: 125 mg/dL (ref 0.0–149.0)
VLDL: 25 mg/dL (ref 0.0–40.0)

## 2014-05-21 ENCOUNTER — Other Ambulatory Visit: Payer: Self-pay | Admitting: Internal Medicine

## 2014-07-01 ENCOUNTER — Encounter: Payer: Self-pay | Admitting: Endocrinology

## 2014-07-01 ENCOUNTER — Ambulatory Visit (INDEPENDENT_AMBULATORY_CARE_PROVIDER_SITE_OTHER): Payer: BLUE CROSS/BLUE SHIELD | Admitting: Endocrinology

## 2014-07-01 VITALS — BP 129/82 | HR 72 | Temp 98.1°F | Resp 14 | Ht 68.0 in | Wt 182.8 lb

## 2014-07-01 DIAGNOSIS — E042 Nontoxic multinodular goiter: Secondary | ICD-10-CM | POA: Diagnosis not present

## 2014-07-01 DIAGNOSIS — R1314 Dysphagia, pharyngoesophageal phase: Secondary | ICD-10-CM

## 2014-07-01 LAB — T3, FREE: T3, Free: 2.5 pg/mL (ref 2.3–4.2)

## 2014-07-01 LAB — TSH: TSH: 0.46 u[IU]/mL (ref 0.35–4.50)

## 2014-07-01 NOTE — Progress Notes (Signed)
Patient ID: Leah Cameron, female   DOB: 1951-03-17, 64 y.o.   MRN: [id number]  Reason for Appointment: Goiter , new visit    History of Present Illness:   The patient's thyroid enlargement was first discovered by her dentist about 4 years ago. She was then referred by her primary care physician for ultrasound. Ultrasound had shown a 2.1 cm nodule in the left side and this was biopsied. She has had ultrasound exams in 2010 and again in 2014. Last ultrasound showed a 3.8 cm mixed echogenic left-sided nodule which was previously present and 3 newly diagnosed nodules in the left lobe.  Her isthmus nodule is also increased in size. No unusual characteristics of the nodules are described in the ultrasound  Thyroid biopsy in 2010 showed:abundant colloid with admixed follicular epithelial cells, lymphocytes and blood. A non neoplastic process such as a non neoplastic goiter with cystic degeneration is favored  She is back here for a follow-up now She has had difficulty with swallowing for several years and this is intermittent. She may use water to help her swallow Her symptoms are not any different and not worse  Does not feel like she has any choking sensation in her neck or pressure in any position or when lying down.  Her goiter measured 38.5 cm on the neck circumference on her previous visit in 2014 She thinks her goiter may be a little larger on the left side.  No discomfort  Also her TSH tends to be low normal, no recent labs available  Lab Results  Component Value Date   TSH 0.35 01/10/2013       Past Medical History  Diagnosis Date  . GERD (gastroesophageal reflux disease)   . Hypertension   . Allergic rhinitis   . RLS (restless legs syndrome)   . Thyromegaly   . Back pain, chronic   . Stricture esophagus     last EGD and dilatation 3/09  . Fibromyalgia     sees Dr.Davenshwar  . Menopause     on lexapro was started by gyn  . Syncope 4/11    admitted  thought to be due to over treatment of hypertension  . Vertigo     went to ED on 10/30  . Scoliosis     Past Surgical History  Procedure Laterality Date  . Breast lumpectomy  2000  . Knee surgery  1999/2002    left   . Knee arthroscopy  2003    right  . Shoulder surgery  2004  . Biopsy thyroid  01-2009    negative   . Tonsillectomy    . Colonoscopy    . Back surgery  09-05-13    cage and rods L4 L5 (@ [facility])    Family History  Problem Relation Age of Onset  . Colon cancer Neg Hx   . Breast cancer Other     1/2 sister  . Diabetes Other     aunt  . Coronary artery disease Other     GF  . Lung cancer Other     cousin  . Stroke Mother     M, F, 1/2 sister, sister   . Thyroid disease Mother    Her mother was followed for thyroid disease without treatment, probably a goiter   Social History:  reports that she has never smoked. She has never used smokeless tobacco. She reports that she drinks alcohol. She reports that she does not use illicit drugs.  Allergies:  Allergies  Allergen Reactions  . Cyclobenzaprine Hcl Other (See Comments)    Face swelling  . Flexeril [Cyclobenzaprine] Rash and Swelling  . Hydrocodone-Acetaminophen Rash  . Other Rash, Swelling and Itching  . Penicillins Swelling and Rash    Tolerated cefazolin  . Tramadol Rash  . Tramadol Hcl Itching and Rash      Medication List       This list is accurate as of: 07/01/14  2:39 PM.  Always use your most recent med list.               amLODipine 5 MG tablet  Commonly known as:  NORVASC  Take 1 tablet (5 mg total) by mouth daily.     aspirin 81 MG tablet  Take 81 mg by mouth daily.     benazepril 40 MG tablet  Commonly known as:  LOTENSIN  Take 1 tablet daily.     CALTRATE 600+D 600-400 MG-UNIT per chew tablet  Generic drug:  Calcium Carbonate-Vitamin D  Chew 1 tablet by mouth 2 (two) times daily.     escitalopram 10 MG tablet  Commonly known as:  LEXAPRO  Take 1 tablet daily.      hydrocortisone 2.5 % cream  Apply topically 2 (two) times daily.     Hydroquinone Microspheres 4 % Crea  Use daily as needed, next RF per derm     lansoprazole 30 MG capsule  Commonly known as:  PREVACID  Take 1 capsule (30 mg total) by mouth 2 (two) times daily.     LIDODERM 5 %  Generic drug:  lidocaine  Place 1 patch onto the skin every 12 (twelve) hours. Remove & Discard patch within 12 hours or as directed by MD     multivitamin with minerals Tabs tablet  Take 1 tablet by mouth daily.     spironolactone 25 MG tablet  Commonly known as:  ALDACTONE     VITAMIN C PO  Take 1 tablet by mouth daily.     zolpidem 10 MG tablet  Commonly known as:  AMBIEN  Take 1 tablet (10 mg total) by mouth at bedtime as needed. For insomnia        Review of Systems:   She has a  history of high blood pressure. She does not think she has had any palpitations now No recent weight change    Examination:    BP 129/82 mmHg  Pulse 72  Temp(Src) 98.1 F (36.7 C)  Resp 14  Ht 5\' 8"  (1.727 m)  Wt 182 lb 12.8 oz (82.918 kg)  BMI 27.80 kg/m2  SpO2 97%   General Appearance: pleasant, well-built and nourished          Eyes: No  prominence of her eyes or eyelid swelling .          Neck: The thyroid is enlarged .  The left side is enlarged about 3 times normal, smooth, soft-firm.  Also has a twice normal enlargement of the isthmus. Right-sided is slightly enlarged, about 1-1/2-2 times  but smooth and slightly firm. No distinct nodules felt. Neck circumference is 37.5 cm over the thyroid There is no stridor. Pemberton sign is negative  There is no lymphadenopathy .     Neurological: REFLEXES: at biceps are brisk.    Assessment/Plan:  Multinodular goiter, long-standing with mostly a left-sided enlargement and has had a relatively large nodule on the left side She has had a progressive increase in size of the nodule and some increase in  size of the isthmus as well as newer nodules present  on the last ultrasound on the left side However clinically her goiter appears to be about the same or smaller size on the left side without any change in the size of the dominant nodule or texture Also she doesn't appear to have difficulty raising her arms overhead which she did previously  Discussed options for treatment as observation only, surgery or reductive iodine if she has subclinical hyperthyroidism She will have thyroid functions done today Repeat ultrasound will be ordered and if stable will not need any further ultrasound exams unless there is a clinical change   Ulani Degrasse 07/01/2014, 2:39 PM

## 2014-07-01 NOTE — Progress Notes (Deleted)
Patient ID: Leah Cameron, female   DOB: [birth year], 64 y.o.   MRN: [id number]    Reason for Appointment: Goiter, new consultation    History of Present Illness:   The patient's thyroid enlargement was first discovered in   She has had difficulty with swallowing  Does not feel like she has any choking sensation in her neck or pressure in any position or when lying down.  Lab Results  Component Value Date   TSH 0.35 01/10/2013   TSH 0.35 10/26/2011   TSH 0.493 ***Test methodology is 3rd generation TSH*** 07/21/2009   FREET4 0.93 01/10/2013   FREET4 1.15 07/21/2009     She has had an ultrasound exam in   Thyroid biopsy in     Medication List       This list is accurate as of: 07/01/14  2:30 PM.  Always use your most recent med list.               amLODipine 5 MG tablet  Commonly known as:  NORVASC  Take 1 tablet (5 mg total) by mouth daily.     aspirin 81 MG tablet  Take 81 mg by mouth daily.     benazepril 40 MG tablet  Commonly known as:  LOTENSIN  Take 1 tablet daily.     CALTRATE 600+D 600-400 MG-UNIT per chew tablet  Generic drug:  Calcium Carbonate-Vitamin D  Chew 1 tablet by mouth 2 (two) times daily.     escitalopram 10 MG tablet  Commonly known as:  LEXAPRO  Take 1 tablet daily.     hydrocortisone 2.5 % cream  Apply topically 2 (two) times daily.     Hydroquinone Microspheres 4 % Crea  Use daily as needed, next RF per derm     lansoprazole 30 MG capsule  Commonly known as:  PREVACID  Take 1 capsule (30 mg total) by mouth 2 (two) times daily.     LIDODERM 5 %  Generic drug:  lidocaine  Place 1 patch onto the skin every 12 (twelve) hours. Remove & Discard patch within 12 hours or as directed by MD     multivitamin with minerals Tabs tablet  Take 1 tablet by mouth daily.     spironolactone 25 MG tablet  Commonly known as:  ALDACTONE     VITAMIN C PO  Take 1 tablet by mouth daily.     zolpidem 10 MG tablet  Commonly known as:  AMBIEN   Take 1 tablet (10 mg total) by mouth at bedtime as needed. For insomnia        Allergies:  Allergies  Allergen Reactions  . Cyclobenzaprine Hcl Other (See Comments)    Face swelling  . Flexeril [Cyclobenzaprine] Rash and Swelling  . Hydrocodone-Acetaminophen Rash  . Other Rash, Swelling and Itching  . Penicillins Swelling and Rash    Tolerated cefazolin  . Tramadol Rash  . Tramadol Hcl Itching and Rash    Past Medical History  Diagnosis Date  . GERD (gastroesophageal reflux disease)   . Hypertension   . Allergic rhinitis   . RLS (restless legs syndrome)   . Thyromegaly   . Back pain, chronic   . Stricture esophagus     last EGD and dilatation 3/09  . Fibromyalgia     sees Dr.Davenshwar  . Menopause     on lexapro was started by gyn  . Syncope 4/11    admitted thought to be due to over treatment of hypertension  .  Vertigo     went to ED on 10/30  . Scoliosis     Past Surgical History  Procedure Laterality Date  . Breast lumpectomy  2000  . Knee surgery  1999/2002    left   . Knee arthroscopy  2003    right  . Shoulder surgery  2004  . Biopsy thyroid  01-2009    negative   . Tonsillectomy    . Colonoscopy    . Back surgery  09-05-13    cage and rods L4 L5 (@ [facility])    Family History  Problem Relation Age of Onset  . Colon cancer Neg Hx   . Breast cancer Other     1/2 sister  . Diabetes Other     aunt  . Coronary artery disease Other     GF  . Lung cancer Other     cousin  . Stroke Mother     M, F, 1/2 sister, sister   . Thyroid disease Mother     Social History:  reports that she has never smoked. She has never used smokeless tobacco. She reports that she drinks alcohol. She reports that she does not use illicit drugs.   Review of Systems:  There is no history of high blood pressure.             No  history of Diabetes.             Examination:   BP 129/82 mmHg  Pulse 72  Temp(Src) 98.1 F (36.7 C)  Resp 14  Ht 5\' 8"  (1.727 m)  Wt  182 lb 12.8 oz (82.918 kg)  BMI 27.80 kg/m2  SpO2 97%   General Appearance: pleasant,          Eyes: No abnormal prominence or eyelid swelling.          Neck: The thyroid is enlarged . Neck circumference is     over the thyroid There is no stridor. Pemberton sign is negative There is no lymphadenopathy .    Cardiovascular: Normal  heart sounds, no murmur Respiratory:  Lungs clear Neurological: REFLEXES: at biceps are normal.  Skin: no rash        Assessment/Plan:  Multinodular goiter     Lauranne Beyersdorf 07/01/2014

## 2014-07-02 LAB — T4, FREE: Free T4: 0.86 ng/dL (ref 0.60–1.60)

## 2014-07-02 NOTE — Progress Notes (Signed)
Quick Note:  Please let patient know that the lab result is normal and no further action needed ______ 

## 2014-07-05 ENCOUNTER — Ambulatory Visit
Admission: RE | Admit: 2014-07-05 | Discharge: 2014-07-05 | Disposition: A | Discharge status: Home / Self Care | Payer: BLUE CROSS/BLUE SHIELD | Source: Ambulatory Visit | Attending: Endocrinology | Admitting: Endocrinology

## 2014-07-05 DIAGNOSIS — E042 Nontoxic multinodular goiter: Secondary | ICD-10-CM

## 2014-07-09 ENCOUNTER — Other Ambulatory Visit: Payer: Self-pay | Admitting: Internal Medicine

## 2014-07-12 ENCOUNTER — Encounter: Payer: Self-pay | Admitting: Endocrinology

## 2014-07-19 ENCOUNTER — Other Ambulatory Visit: Payer: Self-pay | Admitting: Endocrinology

## 2014-07-19 DIAGNOSIS — E041 Nontoxic single thyroid nodule: Secondary | ICD-10-CM

## 2014-07-23 ENCOUNTER — Other Ambulatory Visit: Payer: Self-pay | Admitting: Internal Medicine

## 2014-07-26 ENCOUNTER — Telehealth: Payer: Self-pay | Admitting: Internal Medicine

## 2014-07-26 NOTE — Telephone Encounter (Signed)
Pre Visit letter sent  °

## 2014-08-01 ENCOUNTER — Other Ambulatory Visit (HOSPITAL_COMMUNITY)
Admission: RE | Admit: 2014-08-01 | Discharge: 2014-08-01 | Disposition: A | Discharge status: Home / Self Care | Payer: BLUE CROSS/BLUE SHIELD | Source: Ambulatory Visit | Attending: Interventional Radiology | Admitting: Interventional Radiology

## 2014-08-01 ENCOUNTER — Ambulatory Visit
Admission: RE | Admit: 2014-08-01 | Discharge: 2014-08-01 | Disposition: A | Discharge status: Home / Self Care | Payer: BLUE CROSS/BLUE SHIELD | Source: Ambulatory Visit | Attending: Endocrinology | Admitting: Endocrinology

## 2014-08-01 DIAGNOSIS — E041 Nontoxic single thyroid nodule: Secondary | ICD-10-CM

## 2014-08-05 NOTE — Progress Notes (Signed)
Quick Note:  Please let patient know that the thyroid biopsy result is benign and no further action needed ______

## 2014-08-11 HISTORY — PX: TRIGGER FINGER RELEASE: SHX641

## 2014-08-13 ENCOUNTER — Telehealth: Payer: Self-pay | Admitting: *Deleted

## 2014-08-13 ENCOUNTER — Encounter: Payer: Self-pay | Admitting: *Deleted

## 2014-08-13 NOTE — Telephone Encounter (Signed)
Pre-Visit Call completed with patient and chart updated.   Pre-Visit Info documented in Specialty Comments under [employer].    

## 2014-08-14 ENCOUNTER — Ambulatory Visit (INDEPENDENT_AMBULATORY_CARE_PROVIDER_SITE_OTHER): Payer: BLUE CROSS/BLUE SHIELD | Admitting: Internal Medicine

## 2014-08-14 ENCOUNTER — Encounter: Payer: Self-pay | Admitting: Internal Medicine

## 2014-08-14 VITALS — BP 124/66 | HR 70 | Temp 98.5°F | Ht 68.0 in | Wt 181.2 lb

## 2014-08-14 DIAGNOSIS — Z0000 Encounter for general adult medical examination without abnormal findings: Secondary | ICD-10-CM

## 2014-08-14 NOTE — Patient Instructions (Signed)
Decrease Lexapro 10 mg to half tablet daily Decrease Ambien to half tablet at bedtime, after 4 or 5 weeks, take it only as needed  Think about MYFITNESSPAL to help you with calorie counting  Please come back in 3 months  HEALTHY SLEEP Sleep hygiene: Basic rules for a good night's sleep  Sleep only as much as you need to feel rested and then get out of bed  Keep a regular sleep schedule  Avoid forcing sleep  Exercise regularly for at least 20 minutes, preferably 4 to 5 hours before bedtime  Avoid caffeinated beverages after lunch  Avoid alcohol near bedtime: no "night cap"  Avoid smoking, especially in the evening  Do not go to bed hungry  Adjust bedroom environment  Avoid prolonged use of light-emitting screens before bedtime   Deal with your worries before bedtime

## 2014-08-14 NOTE — Assessment & Plan Note (Addendum)
Td 7-09 zostavax -- 2015  Female care per gyn,  Pap-- saw  Dr. Carlota Raspberry at [facility]- normal , had a normal PAP 2015 MMG-- 10/23/13 with Evangeline Dakin, MD at [facility]: Neg  Bone Density-- 11/28/13 at [facility]- Osteopenia used to be check by rheumatology, will consider repeat a DEXA by  2018; on ca and vit d   Colonoscopy:  01/10/2002 and 10- 2013, neg , 10 years   Extensive discussion about diet and exercise, calorie counting?. Will order labs on return to the office  Other issues: Labs reviewed, recent FLP normal Self discontinue aspirin d/t easy bruising   Will decrease Lexapro to 5 mg, was started for menopausal symptoms,  SSRIs may be a contributing factor to weight gain. Likes to get off Ambien, we agreed to decrease to 5 mg at night and then use it as needed only. Pt concerned about long-term use of Ambien. Fibromyalgia, back pain: Feeling much better after back surgery last year, going to rheumatology for fibromyalgia on as needed basis. GERD, well-controlled as long as she takes PPIs. She started medication supplement  Return to clinic in 3 months to assess the changes in her medications.

## 2014-08-14 NOTE — Progress Notes (Signed)
Pre visit review using our clinic review tool, if applicable. No additional management support is needed unless otherwise documented below in the visit note. 

## 2014-08-14 NOTE — Progress Notes (Signed)
Subjective:    Patient ID: Leah Cameron, female    DOB: 06/06/1950, 64 y.o.   MRN: [id number]  DOS:  08/14/2014 Type of visit - description : cpx Interval history: In general feeling well, concerned about gaining weight. Not taking aspirin due to bruising Good compliance with medication otherwise, ambulatory BPs 120/60    Review of Systems Constitutional: No fever, chills. No unexplained wt changes. No unusual sweats HEENT: No dental problems, ear discharge, facial swelling, voice changes. No eye discharge, redness or intolerance to light Respiratory: No wheezing or difficulty breathing. No cough , mucus production Cardiovascular: No CP, leg swelling or palpitations GI: no nausea, vomiting, diarrhea or abdominal pain.  No blood in the stools. No dysphagia   Endocrine: No polyphagia, polyuria or polydipsia GU: No dysuria, gross hematuria, difficulty urinating. No urinary urgency or frequency. Musculoskeletal: No joint swellings or unusual aches or pains Skin: No change in the color of the skin, palor or rash Allergic, immunologic: No environmental allergies or food allergies Neurological: No dizziness or syncope. No headaches. No diplopia, slurred speech, motor deficits, facial numbness Hematological: No enlarged lymph nodes, easy bruising or bleeding Psychiatry: No suicidal ideas, hallucinations, behavior problems or confusion. No unusual/severe anxiety or depression.     Past Medical History  Diagnosis Date  . GERD (gastroesophageal reflux disease)   . Hypertension   . Allergic rhinitis   . RLS (restless legs syndrome)   . Thyromegaly   . Back pain, chronic   . Stricture esophagus     last EGD and dilatation 3/09  . Fibromyalgia     sees Dr.Davenshwar  . Menopause     on lexapro was started by gyn  . Syncope 4/11    admitted thought to be due to over treatment of hypertension  . Vertigo     went to ED on 10/30  . Scoliosis     Past Surgical History    Procedure Laterality Date  . Breast lumpectomy  2000  . Knee surgery  1999/2002    left   . Knee arthroscopy  2003    right  . Shoulder surgery  2004  . Biopsy thyroid  01-2009    negative   . Tonsillectomy    . Colonoscopy    . Back surgery  09-05-13    cage and rods L4 L5 (@ [facility])    History   Social History  . Marital Status: Married    Spouse Name: N/A  . Number of Children: 2  . Years of Education: N/A   Occupational History  .  Retired 2001, Public relations account executive, disable      Social History Main Topics  . Smoking status: Never Smoker   . Smokeless tobacco: Never Used  . Alcohol Use: Yes     Comment: socially   . Drug Use: No  . Sexual Activity: Not on file   Other Topics Concern  . Not on file   Social History Narrative   Disable                  Family History  Problem Relation Age of Onset  . Colon cancer Neg Hx   . Breast cancer Other     1/2 sister  . Diabetes Other     aunt  . Coronary artery disease Other     GF late in life  . Lung cancer Other     cousin  . Stroke Mother     M, F, 1/2  sister, sister   . Thyroid disease Mother        Medication List       This list is accurate as of: 08/14/14  6:03 PM.  Always use your most recent med list.               amLODipine 5 MG tablet  Commonly known as:  NORVASC  Take 1 tablet (5 mg total) by mouth daily.     B-12 PO  Take 1 drop by mouth daily.     benazepril 40 MG tablet  Commonly known as:  LOTENSIN  Take 1 tablet daily.     CALTRATE 600+D 600-400 MG-UNIT per chew tablet  Generic drug:  Calcium Carbonate-Vitamin D  Chew 1 tablet by mouth 2 (two) times daily.     escitalopram 10 MG tablet  Commonly known as:  LEXAPRO  Take 5 mg by mouth daily.     hydrocortisone 2.5 % cream  Apply topically 2 (two) times daily.     Hydroquinone Microspheres 4 % Crea  Use daily as needed, next RF per derm     lansoprazole 30 MG capsule  Commonly known as:  PREVACID  Take 1 capsule  (30 mg total) by mouth 2 (two) times daily.     LIDODERM 5 %  Generic drug:  lidocaine  Place 1 patch onto the skin every 12 (twelve) hours. Remove & Discard patch within 12 hours or as directed by MD     Magnesium 250 MG Tabs  Take 1 tablet by mouth daily.     multivitamin with minerals Tabs tablet  Take 1 tablet by mouth daily.     spironolactone 25 MG tablet  Commonly known as:  ALDACTONE     VITAMIN C PO  Take 1 tablet by mouth daily.     zolpidem 10 MG tablet  Commonly known as:  AMBIEN  Take 5 mg by mouth at bedtime as needed for sleep.           Objective:   Physical Exam BP 124/66 mmHg  Pulse 70  Temp(Src) 98.5 F (36.9 C) (Oral)  Ht 5\' 8"  (1.727 m)  Wt 181 lb 4 oz (82.214 kg)  BMI 27.57 kg/m2  SpO2 99%  General:   Well developed, well nourished . NAD.  Neck:  Full range of motion. Supple.   HEENT:  Normocephalic . Face symmetric, atraumatic Lungs:  CTA B Normal respiratory effort, no intercostal retractions, no accessory muscle use. Heart: RRR,  no murmur.  No pretibial edema bilaterally  Abdomen:  Not distended, soft, non-tender. No rebound or rigidity. No mass,organomegaly Skin: Exposed areas without rash. Not pale. Not jaundice Neurologic:  alert & oriented X3.  Speech normal, gait appropriate for age and unassisted Strength symmetric and appropriate for age.  Psych: Cognition and judgment appear intact.  Cooperative with normal attention span and concentration.  Behavior appropriate. No anxious or depressed appearing.     Assessment & Plan:

## 2014-08-14 NOTE — Addendum Note (Signed)
Addended by: Kathlene November E on: 08/14/2014 06:07 PM   Modules accepted: Miquel Dunn

## 2014-10-22 ENCOUNTER — Other Ambulatory Visit: Payer: Self-pay

## 2014-10-22 DIAGNOSIS — Z1231 Encounter for screening mammogram for malignant neoplasm of breast: Secondary | ICD-10-CM

## 2014-11-12 ENCOUNTER — Other Ambulatory Visit: Payer: Self-pay | Admitting: Internal Medicine

## 2014-11-12 NOTE — Telephone Encounter (Addendum)
Rx request denied. Rx given on 05/17/2014 #90 tablets and 2 refills. Pt should still have 2 months worth of medication if taking as prescribed.

## 2014-11-16 ENCOUNTER — Other Ambulatory Visit: Payer: Self-pay | Admitting: Internal Medicine

## 2014-11-18 ENCOUNTER — Telehealth: Payer: Self-pay | Admitting: *Deleted

## 2014-11-18 NOTE — Telephone Encounter (Signed)
Prior authorization for lansoprazole initiated. Awaiting determination. JG//CMA

## 2014-11-22 ENCOUNTER — Ambulatory Visit: Payer: BLUE CROSS/BLUE SHIELD | Admitting: Internal Medicine

## 2014-11-26 ENCOUNTER — Ambulatory Visit: Payer: BLUE CROSS/BLUE SHIELD

## 2014-11-26 ENCOUNTER — Telehealth: Payer: Self-pay | Admitting: Internal Medicine

## 2014-11-26 NOTE — Telephone Encounter (Signed)
Approved effective 10/18/2017 - 11/18/2015. Case ID: [id number]. Approval letter sent for scanning. JG//CMA

## 2014-11-27 ENCOUNTER — Ambulatory Visit (INDEPENDENT_AMBULATORY_CARE_PROVIDER_SITE_OTHER): Payer: BLUE CROSS/BLUE SHIELD | Admitting: Internal Medicine

## 2014-11-27 ENCOUNTER — Encounter: Payer: Self-pay | Admitting: Internal Medicine

## 2014-11-27 VITALS — BP 128/76 | HR 61 | Temp 98.0°F | Ht 68.0 in | Wt 182.2 lb

## 2014-11-27 DIAGNOSIS — Z114 Encounter for screening for human immunodeficiency virus [HIV]: Secondary | ICD-10-CM

## 2014-11-27 DIAGNOSIS — I10 Essential (primary) hypertension: Secondary | ICD-10-CM | POA: Diagnosis not present

## 2014-11-27 DIAGNOSIS — F411 Generalized anxiety disorder: Secondary | ICD-10-CM | POA: Diagnosis not present

## 2014-11-27 DIAGNOSIS — Z1159 Encounter for screening for other viral diseases: Secondary | ICD-10-CM

## 2014-11-27 LAB — BASIC METABOLIC PANEL
BUN: 19 mg/dL (ref 6–23)
CHLORIDE: 103 meq/L (ref 96–112)
CO2: 31 mEq/L (ref 19–32)
Calcium: 10.1 mg/dL (ref 8.4–10.5)
Creatinine, Ser: 0.92 mg/dL (ref 0.40–1.20)
GFR: 79.23 mL/min (ref >60.00)
Glucose, Bld: 89 mg/dL (ref 70–99)
POTASSIUM: 4.1 meq/L (ref 3.5–5.1)
SODIUM: 139 meq/L (ref 135–145)

## 2014-11-27 LAB — HEPATITIS C ANTIBODY: HCV AB: NEGATIVE

## 2014-11-27 LAB — HIV ANTIBODY (ROUTINE TESTING W REFLEX): HIV 1&2 Ab, 4th Generation: NONREACTIVE

## 2014-11-27 NOTE — Progress Notes (Signed)
Pre visit review using our clinic review tool, if applicable. No additional management support is needed unless otherwise documented below in the visit note. 

## 2014-11-27 NOTE — Progress Notes (Signed)
Subjective:    Patient ID: Leah Cameron, female    DOB: May 16, 1950, 64 y.o.   MRN: [id number]  DOS:  11/27/2014 Type of visit - description : Follow-up Interval history: Since the last visit, she decided not to stop Lexapro or Ambien. Has not been able to lose weight Had a lot of stress going on and she does not think is the right time to do any changes on her meds.  Wt Readings from Last 3 Encounters:  11/27/14 182 lb 4 oz (82.668 kg)  08/14/14 181 lb 4 oz (82.214 kg)  07/01/14 182 lb 12.8 oz (82.918 kg)   Review of Systems   Past Medical History  Diagnosis Date  . GERD (gastroesophageal reflux disease)   . Hypertension   . Allergic rhinitis   . RLS (restless legs syndrome)   . Thyromegaly   . Back pain, chronic   . Stricture esophagus     last EGD and dilatation 3/09  . Fibromyalgia     sees Dr.Davenshwar  . Menopause     on lexapro was started by gyn  . Syncope 4/11    admitted thought to be due to over treatment of hypertension  . Vertigo     went to ED on 10/30  . Scoliosis     Past Surgical History  Procedure Laterality Date  . Breast lumpectomy  2000  . Knee surgery  1999/2002    left   . Knee arthroscopy  2003    right  . Shoulder surgery  2004  . Biopsy thyroid  01-2009    negative   . Tonsillectomy    . Colonoscopy    . Back surgery  09-05-13    cage and rods L4 L5 (@ [facility])    Social History   Social History  . Marital Status: Married    Spouse Name: N/A  . Number of Children: 2  . Years of Education: N/A   Occupational History  .  Retired 2001, Public relations account executive, disable      Social History Main Topics  . Smoking status: Never Smoker   . Smokeless tobacco: Never Used  . Alcohol Use: Yes     Comment: socially   . Drug Use: No  . Sexual Activity: Not on file   Other Topics Concern  . Not on file   Social History Narrative   Disable                     Medication List       This list is accurate as of: 11/27/14  11:59 PM.  Always use your most recent med list.               amLODipine 5 MG tablet  Commonly known as:  NORVASC  Take 1 tablet (5 mg total) by mouth daily.     B-12 PO  Take 1 drop by mouth daily.     benazepril 40 MG tablet  Commonly known as:  LOTENSIN  Take 1 tablet daily.     CALTRATE 600+D 600-400 MG-UNIT per chew tablet  Generic drug:  Calcium Carbonate-Vitamin D  Chew 1 tablet by mouth 2 (two) times daily.     escitalopram 10 MG tablet  Commonly known as:  LEXAPRO  Take 10 mg by mouth daily.     hydrocortisone 2.5 % cream  Apply topically 2 (two) times daily.     Hydroquinone Microspheres 4 % Crea  Use daily as  needed, next RF per derm     lansoprazole 30 MG capsule  Commonly known as:  PREVACID  Take 1 capsule (30 mg total) by mouth 2 (two) times daily.     LIDODERM 5 %  Generic drug:  lidocaine  Place 1 patch onto the skin every 12 (twelve) hours. Remove & Discard patch within 12 hours or as directed by MD     Magnesium 250 MG Tabs  Take 1 tablet by mouth daily.     multivitamin with minerals Tabs tablet  Take 1 tablet by mouth daily.     spironolactone 25 MG tablet  Commonly known as:  ALDACTONE     VITAMIN C PO  Take 1 tablet by mouth daily.     zolpidem 10 MG tablet  Commonly known as:  AMBIEN  Take 10 mg by mouth at bedtime as needed for sleep.           Objective:   Physical Exam BP 128/76 mmHg  Pulse 61  Temp(Src) 98 F (36.7 C) (Oral)  Ht 5\' 8"  (1.727 m)  Wt 182 lb 4 oz (82.668 kg)  BMI 27.72 kg/m2  SpO2 97% General:   Well developed, well nourished . NAD.  HEENT:  Normocephalic . Face symmetric, atraumatic   Neurologic:  alert & oriented X3.  Speech normal, gait appropriate for age and unassisted Psych--  Cognition and judgment appear intact.  Cooperative with normal attention span and concentration.  Behavior appropriate. No anxious or depressed appearing.      Assessment & Plan:

## 2014-11-27 NOTE — Telephone Encounter (Signed)
Error

## 2014-11-27 NOTE — Patient Instructions (Signed)
Get your blood work before you leave    

## 2014-11-28 NOTE — Assessment & Plan Note (Addendum)
Patient started  SSRIs, Lexapro, for menopausal symptoms however at this point there is a lot of stress in her life and we decided to stay on Lexapro for that reason. For insomnia she is on Ambien, symptoms well-controlled.  Today, I spent more than 15   min with the patient: >50% of the time counseling regards  nonpharmacological management of anxiety and insomnia

## 2014-11-28 NOTE — Assessment & Plan Note (Signed)
Hypertension: Well controlled, has persistent hypokalemia, was referred to nephrology but that has not happened yet. Plan: Check a BMP.

## 2014-11-29 ENCOUNTER — Telehealth: Payer: Self-pay | Admitting: Internal Medicine

## 2014-11-29 NOTE — Telephone Encounter (Signed)
Pt was no show 11/22/14 4:00pm, follow up appt, pt came in on 11/27/14 for follow up, charge for no show?

## 2014-11-29 NOTE — Telephone Encounter (Signed)
No, thx 

## 2014-12-03 LAB — HM PAP SMEAR

## 2014-12-03 LAB — HM MAMMOGRAPHY

## 2014-12-04 ENCOUNTER — Other Ambulatory Visit: Payer: Self-pay | Admitting: Obstetrics and Gynecology

## 2014-12-04 DIAGNOSIS — R928 Other abnormal and inconclusive findings on diagnostic imaging of breast: Secondary | ICD-10-CM

## 2014-12-10 ENCOUNTER — Ambulatory Visit
Admission: RE | Admit: 2014-12-10 | Discharge: 2014-12-10 | Disposition: A | Discharge status: Home / Self Care | Payer: BLUE CROSS/BLUE SHIELD | Source: Ambulatory Visit | Attending: Obstetrics and Gynecology | Admitting: Obstetrics and Gynecology

## 2014-12-10 ENCOUNTER — Other Ambulatory Visit: Payer: BLUE CROSS/BLUE SHIELD

## 2014-12-10 DIAGNOSIS — R928 Other abnormal and inconclusive findings on diagnostic imaging of breast: Secondary | ICD-10-CM

## 2015-01-22 ENCOUNTER — Ambulatory Visit (INDEPENDENT_AMBULATORY_CARE_PROVIDER_SITE_OTHER): Payer: BLUE CROSS/BLUE SHIELD

## 2015-01-22 DIAGNOSIS — Z23 Encounter for immunization: Secondary | ICD-10-CM | POA: Diagnosis not present

## 2015-02-22 ENCOUNTER — Other Ambulatory Visit: Payer: Self-pay | Admitting: Internal Medicine

## 2015-03-21 ENCOUNTER — Other Ambulatory Visit: Payer: Self-pay

## 2015-03-24 ENCOUNTER — Ambulatory Visit (INDEPENDENT_AMBULATORY_CARE_PROVIDER_SITE_OTHER): Payer: BLUE CROSS/BLUE SHIELD | Admitting: Internal Medicine

## 2015-03-24 ENCOUNTER — Encounter: Payer: Self-pay | Admitting: Internal Medicine

## 2015-03-24 VITALS — BP 116/74 | HR 73 | Temp 98.4°F | Ht 68.0 in | Wt 186.1 lb

## 2015-03-24 DIAGNOSIS — R51 Headache: Secondary | ICD-10-CM

## 2015-03-24 DIAGNOSIS — R519 Headache, unspecified: Secondary | ICD-10-CM

## 2015-03-24 DIAGNOSIS — J069 Acute upper respiratory infection, unspecified: Secondary | ICD-10-CM | POA: Diagnosis not present

## 2015-03-24 DIAGNOSIS — Z09 Encounter for follow-up examination after completed treatment for conditions other than malignant neoplasm: Secondary | ICD-10-CM

## 2015-03-24 NOTE — Progress Notes (Signed)
Subjective:    Patient ID: Leah Cameron, female    DOB: [birth year], 64 y.o.   MRN: [id number]  DOS:  03/24/2015 Type of visit - description : Acute visit Interval history:  2 weeks ago developed a headache, located at the forehead, was not  the worst of her life but she was a little concerned because it was persistent. Headache has resolved over the last 36 hours. There was no associated nausea, Zyrtec didn't  help, Mucinex did. Also, 5 days ago developed a cold with ear and nose congestion.   Review of Systems  Denies fever chills No nausea vomiting Mild cough with minimal if any sputum production. No chest congestion or rattling.   Past Medical History  Diagnosis Date  . GERD (gastroesophageal reflux disease)   . Hypertension   . Allergic rhinitis   . RLS (restless legs syndrome)   . Thyromegaly   . Back pain, chronic   . Stricture esophagus     last EGD and dilatation 3/09  . Fibromyalgia     sees Dr.Davenshwar  . Menopause     on lexapro was started by gyn  . Syncope 4/11    admitted thought to be due to over treatment of hypertension  . Vertigo     went to ED on 10/30  . Scoliosis     Past Surgical History  Procedure Laterality Date  . Breast lumpectomy  2000  . Knee surgery  1999/2002    left   . Knee arthroscopy  2003    right  . Shoulder surgery  2004  . Biopsy thyroid  01-2009    negative   . Tonsillectomy    . Colonoscopy    . Back surgery  09-05-13    cage and rods L4 L5 (@ [facility])    Social History   Social History  . Marital Status: Married    Spouse Name: N/A  . Number of Children: 2  . Years of Education: N/A   Occupational History  .  Retired 2001, Public relations account executive, disable      Social History Main Topics  . Smoking status: Never Smoker   . Smokeless tobacco: Never Used  . Alcohol Use: Yes     Comment: socially   . Drug Use: No  . Sexual Activity: Not on file   Other Topics Concern  . Not on file   Social History  Narrative   Disable                     Medication List       This list is accurate as of: 03/24/15  6:43 PM.  Always use your most recent med list.               amLODipine 5 MG tablet  Commonly known as:  NORVASC  Take 1 tablet (5 mg total) by mouth daily.     benazepril 40 MG tablet  Commonly known as:  LOTENSIN  Take 1 tablet daily.     CALTRATE 600+D 600-400 MG-UNIT chew tablet  Generic drug:  Calcium Carbonate-Vitamin D  Chew 1 tablet by mouth 2 (two) times daily.     escitalopram 10 MG tablet  Commonly known as:  LEXAPRO  Take 1 tablet (10 mg total) by mouth daily.     hydrocortisone 2.5 % cream  Apply topically 2 (two) times daily.     Hydroquinone Microspheres 4 % Crea  Use daily as needed, next  RF per derm     lansoprazole 30 MG capsule  Commonly known as:  PREVACID  Take 1 capsule (30 mg total) by mouth 2 (two) times daily.     LIDODERM 5 %  Generic drug:  lidocaine  Place 1 patch onto the skin every 12 (twelve) hours. Remove & Discard patch within 12 hours or as directed by MD     Magnesium 250 MG Tabs  Take 1 tablet by mouth daily.     multivitamin with minerals Tabs tablet  Take 1 tablet by mouth daily.     spironolactone 25 MG tablet  Commonly known as:  ALDACTONE     VITAMIN C PO  Take 1 tablet by mouth daily.     zolpidem 10 MG tablet  Commonly known as:  AMBIEN  Take 10 mg by mouth at bedtime as needed for sleep.           Objective:   Physical Exam BP 116/74 mmHg  Pulse 73  Temp(Src) 98.4 F (36.9 C) (Oral)  Ht 5\' 8"  (1.727 m)  Wt 186 lb 2 oz (84.426 kg)  BMI 28.31 kg/m2  SpO2 98% General:   Well developed, well nourished . NAD.  HEENT:  Normocephalic . Face symmetric, atraumatic. TMs slightly obscured by wax but no red -bulge. Nose is slightly congested, sinuses no TTP Throat symmetric, no red EOMI Lungs:  CTA B Normal respiratory effort, no intercostal retractions, no accessory muscle use. Heart: RRR,  no  murmur.  No pretibial edema bilaterally  Skin: Not pale. Not jaundice Neurologic:  alert & oriented X3.  Speech normal, gait appropriate for age and unassisted. Motor strength symmetric and normal Psych--  Cognition and judgment appear intact.  Cooperative with normal attention span and concentration.  Behavior appropriate. No anxious or depressed appearing.      Assessment & Plan:   Assessment> HTN --hypokalemia, question of primary hyperaldosteronism Anxiety, insomnia Multinodular goiter, Dr. Dwyane Dee, seen 07-2014  ---01-2009 thyroid Bx (-) ---12-2008 u/s--- see report GI: --GERD  --esophageal stricture, dilatation  2009 MSK: --Fibromyalgia sees rheumatology --Chronic back pain --RLS Syncope 2011 (of needed, due to over rx of HTN)  PLAN: Headache: Benign features like history, symptoms resolve, exam today normal. Recommend observation URI: See instructions   RTC 05-2015 as scheduled

## 2015-03-24 NOTE — Assessment & Plan Note (Signed)
Headache: Benign features like history, symptoms resolve, exam today normal. Recommend observation URI: See instructions   RTC 05-2015 as scheduled

## 2015-03-24 NOTE — Progress Notes (Signed)
Pre visit review using our clinic review tool, if applicable. No additional management support is needed unless otherwise documented below in the visit note. 

## 2015-03-24 NOTE — Patient Instructions (Addendum)
Rest, fluids , tylenol  For cough: Take Mucinex DM twice a day as needed until better  For nasal congestion Use OTC Nasocort or Flonase : 2 nasal sprays on each side of the nose daily until you feel better   Also okay to take one antihistaminic OTC daily such as Claritin, Allegra, Zyrtec or Benadryl.  Call if not gradually better over the next  10 days  Call anytime if the symptoms are severe

## 2015-03-31 ENCOUNTER — Other Ambulatory Visit: Payer: Self-pay

## 2015-03-31 MED ORDER — BENAZEPRIL HCL 40 MG PO TABS: 40.0000 mg | ORAL_TABLET | Freq: Every day | ORAL | Status: DC

## 2015-04-14 ENCOUNTER — Other Ambulatory Visit: Payer: Self-pay | Admitting: Internal Medicine

## 2015-05-27 ENCOUNTER — Encounter: Payer: Self-pay | Admitting: Internal Medicine

## 2015-05-27 ENCOUNTER — Ambulatory Visit (INDEPENDENT_AMBULATORY_CARE_PROVIDER_SITE_OTHER): Payer: BLUE CROSS/BLUE SHIELD | Admitting: Internal Medicine

## 2015-05-27 VITALS — BP 124/76 | HR 71 | Temp 98.0°F | Ht 68.0 in | Wt 188.2 lb

## 2015-05-27 DIAGNOSIS — G4700 Insomnia, unspecified: Secondary | ICD-10-CM

## 2015-05-27 DIAGNOSIS — B349 Viral infection, unspecified: Secondary | ICD-10-CM | POA: Diagnosis not present

## 2015-05-27 DIAGNOSIS — Z09 Encounter for follow-up examination after completed treatment for conditions other than malignant neoplasm: Secondary | ICD-10-CM

## 2015-05-27 MED ORDER — AZITHROMYCIN 250 MG PO TABS: ORAL_TABLET | ORAL | Status: DC

## 2015-05-27 MED ORDER — ZOLPIDEM TARTRATE 10 MG PO TABS: 10.0000 mg | ORAL_TABLET | Freq: Every evening | ORAL | Status: DC | PRN

## 2015-05-27 NOTE — Progress Notes (Signed)
Pre visit review using our clinic review tool, if applicable. No additional management support is needed unless otherwise documented below in the visit note. 

## 2015-05-27 NOTE — Progress Notes (Signed)
Subjective:    Patient ID: Leah Cameron, female    DOB: May 13, 1950, 65 y.o.   MRN: [id number]  DOS:  05/27/2015 Type of visit - description : Acute visit Interval history: Symptoms started 3-4 weeks ago with URI, fever initially, chest congestion, cough and a lot of sputum production. Overall feels better however the chest congestion and cough persist. Still see a fair amount of sputum. Greenish in color.   Review of Systems Had sinus pain or congestion, that is decreased. No chest pain or difficulty breathing Some wheezing? No history of asthma or tobacco abuse  Past Medical History  Diagnosis Date  . GERD (gastroesophageal reflux disease)   . Hypertension   . Allergic rhinitis   . RLS (restless legs syndrome)   . Thyromegaly   . Back pain, chronic   . Stricture esophagus     last EGD and dilatation 3/09  . Fibromyalgia     sees Dr.Davenshwar  . Menopause     on lexapro was started by gyn  . Syncope 4/11    admitted thought to be due to over treatment of hypertension  . Vertigo     went to ED on 10/30  . Scoliosis     Past Surgical History  Procedure Laterality Date  . Breast lumpectomy  2000  . Knee surgery  1999/2002    left   . Knee arthroscopy  2003    right  . Shoulder surgery  2004  . Biopsy thyroid  01-2009    negative   . Tonsillectomy    . Colonoscopy    . Back surgery  09-05-13    cage and rods L4 L5 (@ [facility])    Social History   Social History  . Marital Status: Married    Spouse Name: N/A  . Number of Children: 2  . Years of Education: N/A   Occupational History  .  Retired 2001, Public relations account executive, disable      Social History Main Topics  . Smoking status: Never Smoker   . Smokeless tobacco: Never Used  . Alcohol Use: Yes     Comment: socially   . Drug Use: No  . Sexual Activity: Not on file   Other Topics Concern  . Not on file   Social History Narrative   Disable                     Medication List         This list is accurate as of: 05/27/15 10:34 AM.  Always use your most recent med list.               amLODipine 5 MG tablet  Commonly known as:  NORVASC  Take 1 tablet (5 mg total) by mouth daily.     benazepril 40 MG tablet  Commonly known as:  LOTENSIN  Take 1 tablet (40 mg total) by mouth daily.     CALTRATE 600+D 600-400 MG-UNIT chew tablet  Generic drug:  Calcium Carbonate-Vitamin D  Chew 1 tablet by mouth 2 (two) times daily.     escitalopram 10 MG tablet  Commonly known as:  LEXAPRO  Take 1 tablet (10 mg total) by mouth daily.     hydrocortisone 2.5 % cream  Apply topically 2 (two) times daily.     Hydroquinone Microspheres 4 % Crea  Reported on 05/27/2015     lansoprazole 30 MG capsule  Commonly known as:  PREVACID  Take 1  capsule (30 mg total) by mouth 2 (two) times daily.     LIDODERM 5 %  Generic drug:  lidocaine  Place 1 patch onto the skin every 12 (twelve) hours. Reported on 05/27/2015     Magnesium 250 MG Tabs  Take 1 tablet by mouth daily.     multivitamin with minerals Tabs tablet  Take 1 tablet by mouth daily.     spironolactone 25 MG tablet  Commonly known as:  ALDACTONE     VITAMIN C PO  Take 1 tablet by mouth daily.     zolpidem 10 MG tablet  Commonly known as:  AMBIEN  Take 10 mg by mouth at bedtime as needed for sleep.           Objective:   Physical Exam BP 124/76 mmHg  Pulse 71  Temp(Src) 98 F (36.7 C) (Oral)  Ht 5\' 8"  (1.727 m)  Wt 188 lb 4 oz (85.39 kg)  BMI 28.63 kg/m2  SpO2 97% General:   Well developed, well nourished . NAD.  HEENT:  Normocephalic . Face symmetric, atraumatic. TMs normal, nose is slightly congested, sinuses no TTP. Throat symmetric, no red Lungs:  Few rhonchi with cough only Normal respiratory effort, no intercostal retractions, no accessory muscle use. Heart: RRR,  no murmur.  No pretibial edema bilaterally  Skin: Not pale. Not jaundice Neurologic:  alert & oriented X3.  Speech normal, gait  appropriate for age and unassisted Psych--  Cognition and judgment appear intact.  Cooperative with normal attention span and concentration.  Behavior appropriate. No anxious or depressed appearing.      Assessment & Plan:   Assessment> HTN --hypokalemia, question of primary hyperaldosteronism Anxiety, insomnia --- start to rx ambien here ~ 04-2015 (previously by rheumatology) Multinodular goiter, Dr. Dwyane Dee, seen 07-2014  ---01-2009 thyroid Bx (-) ---12-2008 u/s--- see report GI: --GERD  --esophageal stricture, dilatation  2009 [facility]K: --stop seen Dr Herold Harms by the end of 2016 b/c feels better  --Fibromyalgia sees rheumatology --Chronic back pain --RLS Syncope 2011 (of needed, due to over rx of HTN)  PLAN: Viral syndrome, now likely with bronchitis. See instructions. Insomnia: Ambien works really well for her, previously prescribed by rheumatology however she does not need to go back to see them. Will start prescribing Ambien here.  RTC in few days as already scheduled

## 2015-05-27 NOTE — Assessment & Plan Note (Signed)
Viral syndrome, now likely with bronchitis. See instructions. Insomnia: Ambien works really well for her, previously prescribed by rheumatology however she does not need to go back to see them. Will start prescribing Ambien here.  RTC in few days as already scheduled

## 2015-05-27 NOTE — Patient Instructions (Signed)
Rest, fluids , tylenol  For cough:  Take Mucinex DM twice a day as needed until better  For nasal congestion: Use OTC Nasocort or Flonase : 2 nasal sprays on each side of the nose in the morning until you feel better  Avoid decongestants such as  Pseudoephedrine or phenylephrine    Take the antibiotic as prescribed  (zithromax )  Call if not gradually better over the next  10 days  Call anytime if the symptoms are severe   

## 2015-06-04 ENCOUNTER — Ambulatory Visit (INDEPENDENT_AMBULATORY_CARE_PROVIDER_SITE_OTHER): Payer: BLUE CROSS/BLUE SHIELD | Admitting: Internal Medicine

## 2015-06-04 ENCOUNTER — Encounter: Payer: Self-pay | Admitting: Internal Medicine

## 2015-06-04 VITALS — BP 124/76 | HR 73 | Temp 98.2°F | Ht 68.0 in | Wt 187.5 lb

## 2015-06-04 DIAGNOSIS — I10 Essential (primary) hypertension: Secondary | ICD-10-CM

## 2015-06-04 DIAGNOSIS — R635 Abnormal weight gain: Secondary | ICD-10-CM | POA: Diagnosis not present

## 2015-06-04 DIAGNOSIS — B349 Viral infection, unspecified: Secondary | ICD-10-CM

## 2015-06-04 LAB — BASIC METABOLIC PANEL
BUN: 22 mg/dL (ref 6–23)
CHLORIDE: 106 meq/L (ref 96–112)
CO2: 27 meq/L (ref 19–32)
Calcium: 9.3 mg/dL (ref 8.4–10.5)
Creatinine, Ser: 0.9 mg/dL (ref 0.40–1.20)
GFR: 81.13 mL/min (ref >60.00)
GLUCOSE: 99 mg/dL (ref 70–99)
POTASSIUM: 4 meq/L (ref 3.5–5.1)
SODIUM: 139 meq/L (ref 135–145)

## 2015-06-04 LAB — CBC WITH DIFFERENTIAL/PLATELET
BASOS PCT: 0.9 % (ref 0.0–3.0)
Basophils Absolute: 0 10*3/uL (ref 0.0–0.1)
Eosinophils Absolute: 0.2 10*3/uL (ref 0.0–0.7)
Eosinophils Relative: 3.4 % (ref 0.0–5.0)
HCT: 35 % — ABNORMAL LOW (ref 36.0–46.0)
HEMOGLOBIN: 11.5 g/dL — AB (ref 12.0–15.0)
LYMPHS ABS: 1.7 10*3/uL (ref 0.7–4.0)
Lymphocytes Relative: 36.8 % (ref 12.0–46.0)
MCHC: 32.8 g/dL (ref 30.0–36.0)
MCV: 82 fl (ref 78.0–100.0)
MONO ABS: 0.3 10*3/uL (ref 0.1–1.0)
MONOS PCT: 6.8 % (ref 3.0–12.0)
NEUTROS PCT: 52.1 % (ref 43.0–77.0)
Neutro Abs: 2.4 10*3/uL (ref 1.4–7.7)
Platelets: 164 10*3/uL (ref 150.0–400.0)
RBC: 4.27 Mil/uL (ref 3.87–5.11)
RDW: 14.9 % (ref 11.5–15.5)
WBC: 4.6 10*3/uL (ref 4.0–10.5)

## 2015-06-04 NOTE — Progress Notes (Signed)
Pre visit review using our clinic review tool, if applicable. No additional management support is needed unless otherwise documented below in the visit note. 

## 2015-06-04 NOTE — Progress Notes (Signed)
Subjective:    Patient ID: Leah Cameron, female    DOB: [birth year], 65 y.o.   MRN: [id number]  DOS:  06/04/2015 Type of visit - description : Routine visit Interval history: Recently seen with a virus, much improved. Medications reviewed, good compliance. She is concerned about weight gain gradually over the last 2 years along with increased appetite. She admits that she is under a lot of stress. She has been actually more active lately, despite that she is not losing weight.   Review of Systems  No chest pain or lower extremity edema No nausea, vomiting, diarrhea Past Medical History  Diagnosis Date  . GERD (gastroesophageal reflux disease)   . Hypertension   . Allergic rhinitis   . RLS (restless legs syndrome)   . Thyromegaly   . Back pain, chronic   . Stricture esophagus     last EGD and dilatation 3/09  . Fibromyalgia     sees Dr.Davenshwar  . Menopause     on lexapro was started by gyn  . Syncope 4/11    admitted thought to be due to over treatment of hypertension  . Vertigo     went to ED on 10/30  . Scoliosis     Past Surgical History  Procedure Laterality Date  . Breast lumpectomy  2000  . Knee surgery  1999/2002    left   . Knee arthroscopy  2003    right  . Shoulder surgery  2004  . Biopsy thyroid  01-2009    negative   . Tonsillectomy    . Colonoscopy    . Back surgery  09-05-13    cage and rods L4 L5 (@ [facility])    Social History   Social History  . Marital Status: Married    Spouse Name: N/A  . Number of Children: 2  . Years of Education: N/A   Occupational History  .  Retired 2001, Public relations account executive, disable      Social History Main Topics  . Smoking status: Never Smoker   . Smokeless tobacco: Never Used  . Alcohol Use: Yes     Comment: socially   . Drug Use: No  . Sexual Activity: Not on file   Other Topics Concern  . Not on file   Social History Narrative   Disable                     Medication List         This list is accurate as of: 06/04/15 11:59 PM.  Always use your most recent med list.               amLODipine 5 MG tablet  Commonly known as:  NORVASC  Take 1 tablet (5 mg total) by mouth daily.     benazepril 40 MG tablet  Commonly known as:  LOTENSIN  Take 1 tablet (40 mg total) by mouth daily.     CALTRATE 600+D 600-400 MG-UNIT chew tablet  Generic drug:  Calcium Carbonate-Vitamin D  Chew 1 tablet by mouth 2 (two) times daily.     escitalopram 10 MG tablet  Commonly known as:  LEXAPRO  Take 1 tablet (10 mg total) by mouth daily.     lansoprazole 30 MG capsule  Commonly known as:  PREVACID  Take 1 capsule (30 mg total) by mouth 2 (two) times daily.     Magnesium 250 MG Tabs  Take 1 tablet by mouth daily.  multivitamin with minerals Tabs tablet  Take 1 tablet by mouth daily.     spironolactone 25 MG tablet  Commonly known as:  ALDACTONE     VITAMIN C PO  Take 1 tablet by mouth daily.     zolpidem 10 MG tablet  Commonly known as:  AMBIEN  Take 1 tablet (10 mg total) by mouth at bedtime as needed for sleep.           Objective:   Physical Exam BP 124/76 mmHg  Pulse 73  Temp(Src) 98.2 F (36.8 C) (Oral)  Ht 5\' 8"  (1.727 m)  Wt 187 lb 8 oz (85.049 kg)  BMI 28.52 kg/m2  SpO2 97% General:   Well developed, well nourished . NAD.  HEENT:  Normocephalic . Face symmetric, atraumatic Lungs:  CTA B Normal respiratory effort, no intercostal retractions, no accessory muscle use. Heart: RRR,  no murmur.  No pretibial edema bilaterally  Skin: Not pale. Not jaundice Neurologic:  alert & oriented X3.  Speech normal, gait appropriate for age and unassisted Psych--  Cognition and judgment appear intact.  Cooperative with normal attention span and concentration.  Behavior appropriate. No anxious or depressed appearing.      Assessment & Plan:   Assessment> HTN --hypokalemia, question of primary hyperaldosteronism Anxiety, insomnia --- start to rx  ambien here ~ 04-2015 (previously by rheumatology) Multinodular goiter, Dr. Dwyane Dee, seen 07-2014  ---01-2009 thyroid Bx (-) ---12-2008 u/s--- see report GI: --GERD  --esophageal stricture, dilatation  2009 MSK: --stop seen Dr Herold Harms by the end of 2016 b/c feels better  --Fibromyalgia sees rheumatology --Chronic back pain --RLS Syncope 2011 (of needed, due to over rx of HTN)  PLAN: Viral syndrome: Resolving. HTN: Continue amlodipine, benazepril, Aldactone. Check a BMP. Weight gain: This was the main focus of the visit, we talk about diet, exercise, calorie counting. She is under a lot of stress and is doing some comfort eating;  Change lexapro? (it may promote wt gain) declined because she is doing well for the last several years on medication. RTC 4 months, CPX

## 2015-06-04 NOTE — Patient Instructions (Addendum)
GO TO THE LAB : Get the blood work    GO TO THE FRONT DESK  Schedule a complete physical exam to be done in 3-4 months  Please be fasting  Calorie counting app: [email or web address]  Two medicines to help with weight: Belviq, Qsymia

## 2015-06-05 NOTE — Assessment & Plan Note (Signed)
Viral syndrome: Resolving. HTN: Continue amlodipine, benazepril, Aldactone. Check a BMP. Weight gain: This was the main focus of the visit, we talk about diet, exercise, calorie counting. She is under a lot of stress and is doing some comfort eating;  Change lexapro? (it may promote wt gain) declined because she is doing well for the last several years on medication. RTC 4 months, CPX

## 2015-08-13 ENCOUNTER — Other Ambulatory Visit: Payer: Self-pay | Admitting: Internal Medicine

## 2015-08-13 NOTE — Telephone Encounter (Signed)
Requesting Ambien 10mg -Take 1 tablet by mouth every night at bedtime as needed for sleep. Last refill:05/27/15;#30,1 Last OV:06/04/15 Please advise.//AB/CMA

## 2015-08-13 NOTE — Telephone Encounter (Signed)
Pt called stating she is out of [place] and would like it called in today if at all possible so she is able to sleep.

## 2015-08-14 NOTE — Telephone Encounter (Signed)
Printed #30, 3 RF

## 2015-08-15 ENCOUNTER — Encounter: Payer: Self-pay | Admitting: Internal Medicine

## 2015-08-15 ENCOUNTER — Ambulatory Visit (INDEPENDENT_AMBULATORY_CARE_PROVIDER_SITE_OTHER): Payer: BLUE CROSS/BLUE SHIELD | Admitting: Internal Medicine

## 2015-08-15 VITALS — BP 110/68 | HR 79 | Temp 97.9°F | Ht 68.0 in | Wt 188.2 lb

## 2015-08-15 DIAGNOSIS — Z0000 Encounter for general adult medical examination without abnormal findings: Secondary | ICD-10-CM | POA: Diagnosis not present

## 2015-08-15 DIAGNOSIS — Z09 Encounter for follow-up examination after completed treatment for conditions other than malignant neoplasm: Secondary | ICD-10-CM

## 2015-08-15 LAB — TSH: TSH: 0.59 u[IU]/mL (ref 0.35–4.50)

## 2015-08-15 LAB — LIPID PANEL
CHOL/HDL RATIO: 4
Cholesterol: 161 mg/dL (ref 0–200)
HDL: 42.6 mg/dL (ref >39.00)
LDL CALC: 91 mg/dL (ref 0–99)
NonHDL: 117.99
Triglycerides: 133 mg/dL (ref 0.0–149.0)
VLDL: 26.6 mg/dL (ref 0.0–40.0)

## 2015-08-15 LAB — FERRITIN: Ferritin: 35.2 ng/mL (ref 10.0–291.0)

## 2015-08-15 LAB — FOLATE: Folate: >23.7 ng/mL (ref >5.9)

## 2015-08-15 LAB — VITAMIN B12: Vitamin B-12: 575 pg/mL (ref 211–911)

## 2015-08-15 LAB — HEMOGLOBIN: HEMOGLOBIN: 12.2 g/dL (ref 12.0–15.0)

## 2015-08-15 LAB — IRON: Iron: 76 ug/dL (ref 42–145)

## 2015-08-15 NOTE — Assessment & Plan Note (Addendum)
Td 7-09 zostavax -- 2015  Female care ---  Pap-- per Dr Renelda Loma,  2015; MMG-- last 11-2014 Bone Density-- 11/28/13 at [facility]- Osteopenia used to be check by rheumatology, will consider repeat a DEXA by  2018; on ca and vit d   Colonoscopy:  01/10/2002 and 10- 2013, neg , 10 years   labs reviewed, mild anemia noted. Will check a FLP, hemoglobin, TSH, iron and ferritin. Healthy lifestyle encourage

## 2015-08-15 NOTE — Patient Instructions (Signed)
GO TO THE LAB :      Get the blood work     GO TO THE FRONT DESK Schedule your next appointment for a  checkup in 6 months, no fasting    Check the  blood pressure 2 or 3 times a month   Be sure your blood pressure is between 110/65 and  145/85. If it is consistently higher or lower, let me know

## 2015-08-15 NOTE — Assessment & Plan Note (Signed)
Chronic medical problems seem stable, good medication compliance. RTC 6 months.

## 2015-08-15 NOTE — Progress Notes (Signed)
Pre visit review using our clinic review tool, if applicable. No additional management support is needed unless otherwise documented below in the visit note. 

## 2015-08-15 NOTE — Progress Notes (Signed)
Subjective:    Patient ID: Leah Cameron, female    DOB: 1950-10-12, 65 y.o.   MRN: [id number]  DOS:  08/15/2015 Type of visit - description : CPX, no concerns Interval history:  Feeling well, good compliance of medication   Review of Systems Constitutional: No fever. No chills. No unexplained wt changes. No unusual sweats  HEENT: No dental problems, no ear discharge, no facial swelling, no voice changes. No eye discharge, no eye  redness , no  intolerance to light   Respiratory: No wheezing , no  difficulty breathing. No cough , no mucus production  Cardiovascular: No CP, no leg swelling , no  Palpitations  GI: no nausea, no vomiting, no diarrhea , no  abdominal pain.  No blood in the stools. No dysphagia, no odynophagia    Endocrine: No polyphagia, no polyuria , no polydipsia  GU: No dysuria, gross hematuria, difficulty urinating. No urinary urgency, no frequency.  Musculoskeletal: No joint swellings or unusual aches or pains  Skin: No change in the color of the skin, palor , no  Rash  Allergic, immunologic: No environmental allergies , no  food allergies  Neurological: No dizziness no  syncope. No headaches. No diplopia, no slurred, no slurred speech, no motor deficits, no facial  Numbness  Hematological: No enlarged lymph nodes, no easy bruising , no unusual bleedings  Psychiatry: No suicidal ideas, no hallucinations, no beavior problems, no confusion.  No unusual/severe anxiety, no depression   Past Medical History  Diagnosis Date  . GERD (gastroesophageal reflux disease)   . Hypertension   . Allergic rhinitis   . RLS (restless legs syndrome)   . Thyromegaly   . Back pain, chronic   . Stricture esophagus     last EGD and dilatation 3/09  . Fibromyalgia     sees Dr.Davenshwar  . Menopause     on lexapro was started by gyn  . Syncope 4/11    admitted thought to be due to over treatment of hypertension  . Vertigo     went to ED on 10/30  . Scoliosis       Past Surgical History  Procedure Laterality Date  . Breast lumpectomy  2000  . Knee surgery  1999/2002    left   . Knee arthroscopy  2003    right  . Shoulder surgery  2004  . Biopsy thyroid  01-2009    negative   . Tonsillectomy    . Colonoscopy    . Back surgery  09-05-13    cage and rods L4 L5 (@ [facility])  . Trigger finger release  08-2014     R THUMB    Social History   Social History  . Marital Status: Married    Spouse Name: N/A  . Number of Children: 2  . Years of Education: N/A   Occupational History  .  Retired 2001, Public relations account executive, disable      Social History Main Topics  . Smoking status: Never Smoker   . Smokeless tobacco: Never Used  . Alcohol Use: Yes     Comment: socially   . Drug Use: No  . Sexual Activity: Not on file   Other Topics Concern  . Not on file   Social History Narrative   Disable                 Family History  Problem Relation Age of Onset  . Colon cancer Neg Hx   . Breast cancer  Other     1/2 sister  . Diabetes Other     aunt  . Coronary artery disease Other     GF late in life  . Lung cancer Other     cousin  . Stroke Mother     M, F, 1/2 sister, sister   . Thyroid disease Mother        Medication List       This list is accurate as of: 08/15/15  4:55 PM.  Always use your most recent med list.               amLODipine 5 MG tablet  Commonly known as:  NORVASC  Take 1 tablet (5 mg total) by mouth daily.     benazepril 40 MG tablet  Commonly known as:  LOTENSIN  Take 1 tablet (40 mg total) by mouth daily.     CALTRATE 600+D 600-400 MG-UNIT chew tablet  Generic drug:  Calcium Carbonate-Vitamin D  Chew 1 tablet by mouth 2 (two) times daily.     escitalopram 10 MG tablet  Commonly known as:  LEXAPRO  Take 1 tablet (10 mg total) by mouth daily.     lansoprazole 30 MG capsule  Commonly known as:  PREVACID  Take 1 capsule (30 mg total) by mouth 2 (two) times daily.     Magnesium 250 MG Tabs   Take 1 tablet by mouth daily.     multivitamin with minerals Tabs tablet  Take 1 tablet by mouth daily.     spironolactone 25 MG tablet  Commonly known as:  ALDACTONE     VITAMIN C PO  Take 1 tablet by mouth daily.     zolpidem 10 MG tablet  Commonly known as:  AMBIEN  TAKE 1 TABLET BY MOUTH EVERY NIGHT AT BEDTIME AS NEEDED FOR SLEEP           Objective:   Physical Exam BP 110/68 mmHg  Pulse 79  Temp(Src) 97.9 F (36.6 C) (Oral)  Ht 5\' 8"  (1.727 m)  Wt 188 lb 3.2 oz (85.367 kg)  BMI 28.62 kg/m2  SpO2 97%  General:   Well developed, well nourished . NAD.  Neck: + Thyromegaly, nontender, + nodules   HEENT:  Normocephalic . Face symmetric, atraumatic Lungs:  CTA B Normal respiratory effort, no intercostal retractions, no accessory muscle use. Heart: RRR,  no murmur.  No pretibial edema bilaterally  Abdomen:  Not distended, soft, non-tender. No rebound or rigidity.   Skin: Exposed areas without rash. Not pale. Not jaundice Neurologic:  alert & oriented X3.  Speech normal, gait appropriate for age and unassisted Strength symmetric and appropriate for age.  Psych: Cognition and judgment appear intact.  Cooperative with normal attention span and concentration.  Behavior appropriate. No anxious or depressed appearing.    Assessment & Plan:   Assessment> HTN --hypokalemia, question of primary hyperaldosteronism Anxiety, insomnia --- start to rx ambien here ~ 04-2015 (previously by rheumatology) Multinodular goiter, Dr. Dwyane Dee, seen 07-2014  ---01-2009 thyroid Bx (-), BX again 11-2014 GI: --GERD  --esophageal stricture, dilatation  2009 MSK: --fibromyalgia ...stop seen Dr Herold Harms by the end of 2016 b/c feels better  --Chronic back pain -- resolved after 2015 --RLS Syncope 2011 (of needed, due to over rx of HTN)  PLAN: Chronic medical problems seem stable, good medication compliance. RTC 6 months.

## 2015-08-20 ENCOUNTER — Other Ambulatory Visit: Payer: Self-pay | Admitting: Internal Medicine

## 2015-09-24 ENCOUNTER — Other Ambulatory Visit: Payer: Self-pay | Admitting: Internal Medicine

## 2015-09-26 ENCOUNTER — Encounter: Payer: Self-pay | Admitting: Internal Medicine

## 2015-09-26 ENCOUNTER — Ambulatory Visit (INDEPENDENT_AMBULATORY_CARE_PROVIDER_SITE_OTHER): Payer: Medicare Other | Admitting: Internal Medicine

## 2015-09-26 VITALS — BP 116/76 | HR 71 | Temp 99.0°F | Ht 68.0 in | Wt 187.4 lb

## 2015-09-26 DIAGNOSIS — M545 Low back pain, unspecified: Secondary | ICD-10-CM

## 2015-09-26 LAB — POCT URINALYSIS DIPSTICK
Bilirubin, UA: NEGATIVE
Blood, UA: NEGATIVE
GLUCOSE UA: NEGATIVE
Ketones, UA: NEGATIVE
LEUKOCYTES UA: NEGATIVE
Nitrite, UA: NEGATIVE
Protein, UA: NEGATIVE
Spec Grav, UA: 1.015
UROBILINOGEN UA: 0.2
pH, UA: 7

## 2015-09-26 LAB — URINALYSIS, ROUTINE W REFLEX MICROSCOPIC
Bilirubin Urine: NEGATIVE
Hgb urine dipstick: NEGATIVE
Ketones, ur: NEGATIVE
LEUKOCYTES UA: NEGATIVE
NITRITE: NEGATIVE
SPECIFIC GRAVITY, URINE: 1.01 (ref 1.000–1.030)
TOTAL PROTEIN, URINE-UPE24: NEGATIVE
URINE GLUCOSE: NEGATIVE
Urobilinogen, UA: 0.2 (ref 0.0–1.0)
pH: 7.5 (ref 5.0–8.0)

## 2015-09-26 MED ORDER — PREDNISONE 10 MG PO TABS: ORAL_TABLET | ORAL | Status: DC

## 2015-09-26 NOTE — Patient Instructions (Signed)
Take prednisone as prescribed  Tylenol 500 mg 2 tablets 3 times a day as needed  Heating pad, rest.    Go to the ER if severe stomach pain, fever, chills, increased symptoms.  Call immediately if you develop a rash. Shingles?   Call if not improving in the next few days

## 2015-09-26 NOTE — Progress Notes (Signed)
Pre visit review using our clinic review tool, if applicable. No additional management support is needed unless otherwise documented below in the visit note. 

## 2015-09-26 NOTE — Progress Notes (Signed)
Subjective:    Patient ID: Leah Cameron, female    DOB: 1950/09/10, 65 y.o.   MRN: [id number]  DOS:  09/26/2015 Type of visit - description : acute Interval history: Symptoms started 5 days ago with back pain, worse on the left, pain at the left flank and bilateral lower abdominal pain. At this point, she is only hurting at the left low back and flank. Symptoms are worse with certain movements; at some point felt they improve after a bowel movement. Denies any injury or fall but 2 days prior to the onset of symptoms she did some yard work.    Review of Systems   denies fever chills, appetite is slightly decreased for the last 2 days Some upper abdominal bloating. No nausea, vomiting, blood in the stools. Bowel movements are daily normal. No dysuria, gross hematuria difficulty urinating No lower extremity paresthesias.  Past Medical History  Diagnosis Date  . GERD (gastroesophageal reflux disease)   . Hypertension   . Allergic rhinitis   . RLS (restless legs syndrome)   . Thyromegaly   . Back pain, chronic   . Stricture esophagus     last EGD and dilatation 3/09  . Fibromyalgia     sees Dr.Davenshwar  . Menopause     on lexapro was started by gyn  . Syncope 4/11    admitted thought to be due to over treatment of hypertension  . Vertigo     went to ED on 10/30  . Scoliosis     Past Surgical History  Procedure Laterality Date  . Breast lumpectomy  2000  . Knee surgery  1999/2002    left   . Knee arthroscopy  2003    right  . Shoulder surgery  2004  . Biopsy thyroid  01-2009    negative   . Tonsillectomy    . Colonoscopy    . Back surgery  09-05-13    cage and rods L4 L5 (@ [facility])  . Trigger finger release  08-2014     R THUMB    Social History   Social History  . Marital Status: Married    Spouse Name: N/A  . Number of Children: 2  . Years of Education: N/A   Occupational History  .  Retired 2001, Public relations account executive, disable      Social  History Main Topics  . Smoking status: Never Smoker   . Smokeless tobacco: Never Used  . Alcohol Use: Yes     Comment: socially   . Drug Use: No  . Sexual Activity: Not on file   Other Topics Concern  . Not on file   Social History Narrative   Disable                    Medication List       This list is accurate as of: 09/26/15 11:59 PM.  Always use your most recent med list.               amLODipine 5 MG tablet  Commonly known as:  NORVASC  Take 1 tablet (5 mg total) by mouth daily.     benazepril 40 MG tablet  Commonly known as:  LOTENSIN  Take 1 tablet (40 mg total) by mouth daily.     CALTRATE 600+D 600-400 MG-UNIT chew tablet  Generic drug:  Calcium Carbonate-Vitamin D  Chew 1 tablet by mouth 2 (two) times daily.     escitalopram 10 MG tablet  Commonly known as:  LEXAPRO  Take 1 tablet (10 mg total) by mouth daily.     lansoprazole 30 MG capsule  Commonly known as:  PREVACID  Take 1 capsule (30 mg total) by mouth 2 (two) times daily.     Magnesium 250 MG Tabs  Take 1 tablet by mouth daily.     multivitamin with minerals Tabs tablet  Take 1 tablet by mouth daily.     predniSONE 10 MG tablet  Commonly known as:  DELTASONE  4 tablets x 2 days, 3 tabs x 2 days, 2 tabs x 2 days, 1 tab x 2 days     spironolactone 25 MG tablet  Commonly known as:  ALDACTONE     VITAMIN C PO  Take 1 tablet by mouth daily.     zolpidem 10 MG tablet  Commonly known as:  AMBIEN  TAKE 1 TABLET BY MOUTH EVERY NIGHT AT BEDTIME AS NEEDED FOR SLEEP           Objective:   Physical Exam  Skin:      BP 116/76 mmHg  Pulse 71  Temp(Src) 99 F (37.2 C) (Oral)  Ht 5\' 8"  (1.727 m)  Wt 187 lb 6 oz (84.993 kg)  BMI 28.50 kg/m2  SpO2 97% General:   Well developed, well nourished . NAD.  HEENT:  Normocephalic . Face symmetric, atraumatic Lungs:  CTA B Normal respiratory effort, no intercostal retractions, no accessory muscle use. Heart: RRR,  no murmur.  no  pretibial edema bilaterally  Abdomen:  Not distended, soft, minimal tenderness at the left side?Marland Kitchen No rebound or rigidity.  Skin: Not pale. Not jaundice. No rash at the back or abdomen MSK: No TTP at the lumbar spine. Neurologic:  alert & oriented X3.  Speech normal, gait and posture are antalgic, she seems to be in pain when she lays down and get up from the table. DTRs symmetric. Straight leg test negative Psych--  Cognition and judgment appear intact.  Cooperative with normal attention span and concentration.  Behavior appropriate. No anxious or depressed appearing.    Assessment & Plan:   Assessment> HTN --hypokalemia, ?primary hyperaldosteronism Anxiety, insomnia --- start to rx ambien here ~ 04-2015 (previously by rheumatology) Multinodular goiter, Dr. Dwyane Dee, seen 07-2014  ---01-2009 thyroid Bx (-), BX again 11-2014 GI: --GERD  --esophageal stricture, dilatation  2009 MSK: --fibromyalgia >>> stop seen Dr Herold Harms by the end of 2016 b/c feels better  --Chronic back pain -- resolved after 2015 back surgery --RLS Syncope 2011 (of needed, due to over rx of HTN)  PLAN: Back pain: Patient presents with left back and flank pain. ddx-- MSK, UTI, kidney stones, diverticulitis, early shingles etc. On clinical grounds the most likely explanation is a back sprain. Plan: UA, urine culture, treat as a back pain with prednisone, Tylenol. ER if severe symptoms, fever, chills, actual abdominal pain or if not improving. Call if rash.

## 2015-09-27 LAB — URINE CULTURE
Colony Count: NO GROWTH
Organism ID, Bacteria: NO GROWTH

## 2015-09-27 NOTE — Assessment & Plan Note (Signed)
Back pain: Patient presents with left back and flank pain. ddx-- MSK, UTI, kidney stones, diverticulitis, early shingles etc. On clinical grounds the most likely explanation is a back sprain. Plan: UA, urine culture, treat as a back pain with prednisone, Tylenol. ER if severe symptoms, fever, chills, actual abdominal pain or if not improving. Call if rash.

## 2015-10-03 DIAGNOSIS — R0789 Other chest pain: Secondary | ICD-10-CM | POA: Diagnosis not present

## 2015-10-03 DIAGNOSIS — M797 Fibromyalgia: Secondary | ICD-10-CM | POA: Diagnosis not present

## 2015-10-03 DIAGNOSIS — Z888 Allergy status to other drugs, medicaments and biological substances status: Secondary | ICD-10-CM | POA: Diagnosis not present

## 2015-10-03 DIAGNOSIS — G2581 Restless legs syndrome: Secondary | ICD-10-CM | POA: Diagnosis not present

## 2015-10-03 DIAGNOSIS — E042 Nontoxic multinodular goiter: Secondary | ICD-10-CM | POA: Diagnosis not present

## 2015-10-03 DIAGNOSIS — K219 Gastro-esophageal reflux disease without esophagitis: Secondary | ICD-10-CM | POA: Diagnosis not present

## 2015-10-03 DIAGNOSIS — Z7952 Long term (current) use of systemic steroids: Secondary | ICD-10-CM | POA: Diagnosis not present

## 2015-10-03 DIAGNOSIS — Z880 Allergy status to penicillin: Secondary | ICD-10-CM | POA: Diagnosis not present

## 2015-10-03 DIAGNOSIS — I10 Essential (primary) hypertension: Secondary | ICD-10-CM | POA: Diagnosis not present

## 2015-10-03 DIAGNOSIS — R079 Chest pain, unspecified: Secondary | ICD-10-CM | POA: Diagnosis not present

## 2015-10-03 DIAGNOSIS — G4700 Insomnia, unspecified: Secondary | ICD-10-CM | POA: Diagnosis not present

## 2015-10-03 DIAGNOSIS — Z79899 Other long term (current) drug therapy: Secondary | ICD-10-CM | POA: Diagnosis not present

## 2015-10-04 DIAGNOSIS — E042 Nontoxic multinodular goiter: Secondary | ICD-10-CM | POA: Diagnosis not present

## 2015-10-04 DIAGNOSIS — G2581 Restless legs syndrome: Secondary | ICD-10-CM | POA: Diagnosis not present

## 2015-10-04 DIAGNOSIS — M797 Fibromyalgia: Secondary | ICD-10-CM | POA: Diagnosis not present

## 2015-10-04 DIAGNOSIS — R079 Chest pain, unspecified: Secondary | ICD-10-CM | POA: Diagnosis not present

## 2015-10-04 DIAGNOSIS — K219 Gastro-esophageal reflux disease without esophagitis: Secondary | ICD-10-CM | POA: Diagnosis not present

## 2015-10-04 DIAGNOSIS — I10 Essential (primary) hypertension: Secondary | ICD-10-CM | POA: Diagnosis not present

## 2015-10-04 DIAGNOSIS — G4700 Insomnia, unspecified: Secondary | ICD-10-CM | POA: Diagnosis not present

## 2015-10-07 ENCOUNTER — Encounter: Payer: Self-pay | Admitting: Internal Medicine

## 2015-10-07 ENCOUNTER — Telehealth: Payer: Self-pay

## 2015-10-07 ENCOUNTER — Ambulatory Visit (INDEPENDENT_AMBULATORY_CARE_PROVIDER_SITE_OTHER): Payer: Medicare Other | Admitting: Internal Medicine

## 2015-10-07 VITALS — BP 118/74 | HR 72 | Temp 98.7°F | Ht 68.0 in | Wt 189.1 lb

## 2015-10-07 DIAGNOSIS — M545 Low back pain, unspecified: Secondary | ICD-10-CM

## 2015-10-07 DIAGNOSIS — R079 Chest pain, unspecified: Secondary | ICD-10-CM

## 2015-10-07 NOTE — Telephone Encounter (Signed)
Received records, forwarded to Dr. Larose Kells (red folder) for review.

## 2015-10-07 NOTE — Patient Instructions (Signed)
Will schedule a stress test  Aspirin daily  Get nitroglycerine, use every 5 minutes x 3 if chest pain, if no better call 911  ER  If  symptoms severe

## 2015-10-07 NOTE — Progress Notes (Signed)
Pre visit review using our clinic review tool, if applicable. No additional management support is needed unless otherwise documented below in the visit note. 

## 2015-10-07 NOTE — Progress Notes (Signed)
Subjective:    Patient ID: Leah Cameron, female    DOB: 1951/02/04, 65 y.o.   MRN: [id number]  DOS:  10/07/2015 Type of visit - description : ER follow-up Interval history: The patient and her husband [place] a [place] 3 [place];  20 units into the trip she developed chest pain suddenly: Upper left chest, steady, like elephant in the chest or something turning inside, no radiation.  Went to an outside [place] ,  on June 23, so far the only records we have are EKGs ---> NSR, no acute changes, no different from her EKGs here.  Pain subsided after approximately 90 minutes after onset, after meds were give @ ER  She was admitted for 24 hours, a number of tests were done, she was released home with the recommendation to take Toprol, nitroglycerin, restart PPIs and aspirin.   Review of Systems Since she left the hospital she is asymptomatic with no chest pain. She thinks symptoms related to GERD because she has not been taking PPIs for the last 3 months, her previous GERD symptoms were similar to the chest pain she just had but this time were more intense.  No recent fever chills No nausea, vomiting, diarrhea. No abdominal pain No cough Was a slightly short of breath when she had chest pain.  Past Medical History  Diagnosis Date  . GERD (gastroesophageal reflux disease)   . Hypertension   . Allergic rhinitis   . RLS (restless legs syndrome)   . Thyromegaly   . Back pain, chronic   . Stricture esophagus     last EGD and dilatation 3/09  . Fibromyalgia     sees Dr.Davenshwar  . Menopause     on lexapro was started by gyn  . Syncope 4/11    admitted thought to be due to over treatment of hypertension  . Vertigo     went to ED on 10/30  . Scoliosis     Past Surgical History  Procedure Laterality Date  . Breast lumpectomy  2000  . Knee surgery  1999/2002    left   . Knee arthroscopy  2003    right  . Shoulder surgery  2004  . Biopsy thyroid  01-2009   negative   . Tonsillectomy    . Colonoscopy    . Back surgery  09-05-13    cage and rods L4 L5 (@ [facility])  . Trigger finger release  08-2014     R THUMB    Social History   Social History  . Marital Status: Married    Spouse Name: N/A  . Number of Children: 2  . Years of Education: N/A   Occupational History  .  Retired 2001, Public relations account executive, disable      Social History Main Topics  . Smoking status: Never Smoker   . Smokeless tobacco: Never Used  . Alcohol Use: Yes     Comment: socially   . Drug Use: No  . Sexual Activity: Not on file   Other Topics Concern  . Not on file   Social History Narrative   Disable                    Medication List       This list is accurate as of: 10/07/15 11:59 PM.  Always use your most recent med list.               amLODipine 5 MG tablet  Commonly known as:  NORVASC  Take 1 tablet (5 mg total) by mouth daily.     aspirin 81 MG tablet  Take 81 mg by mouth daily.     benazepril 40 MG tablet  Commonly known as:  LOTENSIN  Take 1 tablet (40 mg total) by mouth daily.     CALTRATE 600+D 600-400 MG-UNIT chew tablet  Generic drug:  Calcium Carbonate-Vitamin D  Chew 1 tablet by mouth 2 (two) times daily.     escitalopram 10 MG tablet  Commonly known as:  LEXAPRO  Take 1 tablet (10 mg total) by mouth daily.     lansoprazole 30 MG capsule  Commonly known as:  PREVACID  Take 1 capsule (30 mg total) by mouth 2 (two) times daily.     Magnesium 250 MG Tabs  Take 1 tablet by mouth daily.     multivitamin with minerals Tabs tablet  Take 1 tablet by mouth daily.     predniSONE 10 MG tablet  Commonly known as:  DELTASONE  4 tablets x 2 days, 3 tabs x 2 days, 2 tabs x 2 days, 1 tab x 2 days     spironolactone 25 MG tablet  Commonly known as:  ALDACTONE     VITAMIN C PO  Take 1 tablet by mouth daily.     zolpidem 10 MG tablet  Commonly known as:  AMBIEN  TAKE 1 TABLET BY MOUTH EVERY NIGHT AT BEDTIME AS NEEDED FOR  SLEEP           Objective:   Physical Exam BP 118/74 mmHg  Pulse 72  Temp(Src) 98.7 F (37.1 C) (Oral)  Ht 5\' 8"  (1.727 m)  Wt 189 lb 2 oz (85.787 kg)  BMI 28.76 kg/m2  SpO2 98% General:   Well developed, well nourished . NAD.  HEENT:  Normocephalic . Face symmetric, atraumatic Lungs:  CTA B Normal respiratory effort, no intercostal retractions, no accessory muscle use. Heart: RRR,  no murmur.  no pretibial edema bilaterally  Chest wall: No TTP Abdomen:  Not distended, soft, non-tender. No rebound or rigidity.  Skin: Not pale. Not jaundice Neurologic:  alert & oriented X3.  Speech normal, gait appropriate for age and unassisted Psych--  Cognition and judgment appear intact.  Cooperative with normal attention span and concentration.  Behavior appropriate. No anxious or depressed appearing.    Assessment & Plan:   Assessment> HTN --hypokalemia, ?primary hyperaldosteronism Anxiety, insomnia --- start to rx ambien here ~ 04-2015 (previously by rheumatology) Multinodular goiter, Dr. Dwyane Dee, seen 07-2014  ---01-2009 thyroid Bx (-), BX again 11-2014 GI: --GERD  --esophageal stricture, dilatation  2009 MSK: --fibromyalgia >>> stop seen Dr Herold Harms by the end of 2016 b/c she feels better  --Chronic back pain -- resolved after 2015 back surgery --RLS Syncope 2011 (? due to over rx of HTN) Self dc ASA 2016 d/t easy bruising   PLAN: Chest pain: CP as described above, recently admitted for 24 hours to an outside hospital, EKGs at that facility showed no acute changes and no change compared to EKGs here. Patient believes symptoms were due to GERD as she did not take PPIs for 3 months.  We talk about possible stress test and she would like to pursue that. Plan: Start aspirin at least temporarily, get nitroglycerin for prn use  Stress test Get records  From outside hospital, I like to be sure  PE was r/o. If not, consider checking a d-dimer. Addendum: Discharge summary  chest x-ray showed  subsegmental atelectasis left lower lobe, creatinine 0.9, potassium 4.1, LFTs normal, CBC normal with a hemoglobin of 12.1. Serial troponins negative. Will get a D-dimer Back pain: See last office visit, sx resolved.

## 2015-10-07 NOTE — Telephone Encounter (Signed)
ROI faxed to [facility] at [phone] requesting medical records from hospital admission on 10/03/2015 to 10/04/2015. ROI sent for scanning. Awaiting records.

## 2015-10-07 NOTE — Telephone Encounter (Signed)
Received fax confirmation on 10/07/2015 at 1340.

## 2015-10-07 NOTE — Telephone Encounter (Signed)
Per Dr. Larose Kells, requesting STAT D-dimer. Called and LMOM at home and mobile number informing Pt to return to office to have labs drawn.

## 2015-10-08 ENCOUNTER — Other Ambulatory Visit (INDEPENDENT_AMBULATORY_CARE_PROVIDER_SITE_OTHER): Payer: BC Managed Care – PPO

## 2015-10-08 DIAGNOSIS — R079 Chest pain, unspecified: Secondary | ICD-10-CM | POA: Diagnosis not present

## 2015-10-08 LAB — D-DIMER, QUANTITATIVE (NOT AT ARMC): D DIMER QUANT: 0.31 ug{FEU}/mL (ref <0.50)

## 2015-10-08 NOTE — Telephone Encounter (Signed)
Was able to speak w/ Pt earlier this AM, she is coming to have STAT D-dimer drawn and heading out of town, informed her that D-dimer even STAT may take several hours to result. She informed me to call her on her mobile number w/ results and "I can always turn around if I have to."

## 2015-10-08 NOTE — Telephone Encounter (Signed)
Pt requested results.  Results are in.  D-Dimer-negative.  Per Dr. Larose Kells, no changes.  The same was discussed with patient. She stated understanding.  No questions or concerns voiced.

## 2015-10-08 NOTE — Assessment & Plan Note (Signed)
Chest pain: CP as described above, recently admitted for 24 hours to an outside hospital, EKGs at that facility showed no acute changes and no change compared to EKGs here. Patient believes symptoms were due to GERD as she did not take PPIs for 3 months.  We talk about possible stress test and she would like to pursue that. Plan: Start aspirin at least temporarily, get nitroglycerin for prn use  Stress test Get records  From outside hospital, I like to be sure  PE was r/o. If not, consider checking a d-dimer. Addendum: Discharge summary chest x-ray showed subsegmental atelectasis left lower lobe, creatinine 0.9, potassium 4.1, LFTs normal, CBC normal with a hemoglobin of 12.1. Serial troponins negative. Will get a D-dimer Back pain: See last office visit, sx resolved.

## 2015-10-20 ENCOUNTER — Telehealth (HOSPITAL_COMMUNITY): Payer: Self-pay | Admitting: *Deleted

## 2015-10-20 NOTE — Telephone Encounter (Signed)
Left message on voicemail per DPR in reference to upcoming appointment scheduled on 7/12/17with detailed instructions given per Myocardial Perfusion Study Information Sheet for the test. LM to arrive 15 minutes early, and that it is imperative to arrive on time for appointment to keep from having the test rescheduled. If you need to cancel or reschedule your appointment, please call the office within 24 hours of your appointment. Failure to do so may result in a cancellation of your appointment, and a $50 no show fee. Phone number given for call back for any questions.  Hubbard Robinson, RN

## 2015-10-22 ENCOUNTER — Ambulatory Visit (HOSPITAL_COMMUNITY): Payer: Medicare Other | Attending: Cardiology

## 2015-10-22 VITALS — Ht 68.0 in | Wt 189.0 lb

## 2015-10-22 DIAGNOSIS — R931 Abnormal findings on diagnostic imaging of heart and coronary circulation: Secondary | ICD-10-CM

## 2015-10-22 DIAGNOSIS — R002 Palpitations: Secondary | ICD-10-CM | POA: Insufficient documentation

## 2015-10-22 DIAGNOSIS — R079 Chest pain, unspecified: Secondary | ICD-10-CM | POA: Diagnosis not present

## 2015-10-22 DIAGNOSIS — I10 Essential (primary) hypertension: Secondary | ICD-10-CM | POA: Insufficient documentation

## 2015-10-22 DIAGNOSIS — R9439 Abnormal result of other cardiovascular function study: Secondary | ICD-10-CM

## 2015-10-22 LAB — MYOCARDIAL PERFUSION IMAGING
CHL CUP RESTING HR STRESS: 58 {beats}/min
CSEPPHR: 100 {beats}/min
LVDIAVOL: 87 mL (ref 46–106)
LVSYSVOL: 45 mL
NUC STRESS TID: 1.07
RATE: 0.29
SDS: 0
SRS: 10
SSS: 10

## 2015-10-22 MED ORDER — REGADENOSON 0.4 MG/5ML IV SOLN
0.4000 mg | Freq: Once | INTRAVENOUS | Status: AC
  Administered 2015-10-22: 0.4 mg via INTRAVENOUS

## 2015-10-22 MED ORDER — TECHNETIUM TC 99M TETROFOSMIN IV KIT
10.6000 | PACK | Freq: Once | INTRAVENOUS | Status: AC | PRN
  Administered 2015-10-22: 11 via INTRAVENOUS
  Filled 2015-10-22: qty 11

## 2015-10-22 MED ORDER — TECHNETIUM TC 99M TETROFOSMIN IV KIT
32.4000 | PACK | Freq: Once | INTRAVENOUS | Status: AC | PRN
  Administered 2015-10-22: 32 via INTRAVENOUS
  Filled 2015-10-22: qty 32

## 2015-11-07 ENCOUNTER — Ambulatory Visit (HOSPITAL_COMMUNITY)
Admission: RE | Admit: 2015-11-07 | Discharge: 2015-11-07 | Disposition: A | Discharge status: Home / Self Care | Payer: Medicare Other | Source: Ambulatory Visit | Attending: Internal Medicine | Admitting: Internal Medicine

## 2015-11-07 DIAGNOSIS — R079 Chest pain, unspecified: Secondary | ICD-10-CM | POA: Insufficient documentation

## 2015-11-07 DIAGNOSIS — R9439 Abnormal result of other cardiovascular function study: Secondary | ICD-10-CM | POA: Insufficient documentation

## 2015-11-07 DIAGNOSIS — I119 Hypertensive heart disease without heart failure: Secondary | ICD-10-CM | POA: Insufficient documentation

## 2015-11-07 DIAGNOSIS — R931 Abnormal findings on diagnostic imaging of heart and coronary circulation: Secondary | ICD-10-CM | POA: Insufficient documentation

## 2015-11-07 NOTE — Progress Notes (Signed)
*  PRELIMINARY RESULTS* Echocardiogram 2D Echocardiogram has been performed.  Leavy Cella 11/07/2015, 3:03 PM

## 2015-11-17 ENCOUNTER — Telehealth: Payer: Self-pay | Admitting: Internal Medicine

## 2015-11-17 NOTE — Telephone Encounter (Signed)
Pt is requesting refill on Ambien.  Last OV: 10/07/2015 Last Fill: 08/14/2015 #30 and 3RF (To soon?) UDS: Not needed for Ambien  Please advise.

## 2015-11-17 NOTE — Telephone Encounter (Signed)
Yes , too soon, ask pt to use RFs

## 2015-11-17 NOTE — Telephone Encounter (Signed)
Relation to [id number] Call back number:[phone] Pharmacy: [facility] - [place], [place] - [address] [phone] (Phone) [phone] (Fax)     Reason for call:  Patient would like to discuss her zolpidem (AMBIEN) 10 MG tablet . Please advise

## 2015-11-17 NOTE — Telephone Encounter (Signed)
Spoke w/ [facility] pharmacy, Pt picked up last refill on 11/14/2015.

## 2015-11-18 NOTE — Telephone Encounter (Signed)
Sounds like Pt may need a PA for medication. Will need to call pharmacy and have pharmacy send authorization. Also, per [facility] Controlled Substance Reporting System, #30 of Ambien was picked up at [facility] in [place], [place] on 11/13/2015.

## 2015-11-18 NOTE — Telephone Encounter (Signed)
Pt says that she never request a refill. She says that her insurance will not honor medication. Pt says that PCP would need to write a note stating the reason that she has to take medication.   Because of her condition PCP has prescribed 30 days 1 pill a day for her. So that her insurance will honor and pay for medication.     ALSO, she would like to have CMA go over Echocardiogram results.   CB: [id number] OR [phone]

## 2015-11-19 NOTE — Telephone Encounter (Signed)
Pt is requesting a call back from [place] directly.     Thanks.

## 2015-11-19 NOTE — Telephone Encounter (Signed)
Spoke w/ Pt, she informed me that insurance is requiring a quantity limit exception. Informed her that I would call to see what will be needed for exception when return to the office. Also, discuss ECHO results.

## 2015-11-20 NOTE — Telephone Encounter (Signed)
Quantity exception for Zolpidem 10mg  tab initiated via Covermymeds; KEY: [id number].  Awaiting determination.

## 2015-11-21 ENCOUNTER — Encounter: Payer: Self-pay | Admitting: Internal Medicine

## 2015-11-21 ENCOUNTER — Telehealth: Payer: Self-pay

## 2015-11-21 NOTE — Progress Notes (Unsigned)
Received documentation from [facility] regarding Prior Authorization information needed. Left message for return call.

## 2015-11-21 NOTE — Telephone Encounter (Signed)
Patient calling to see if PA has been completed for Ambien. [place] who states patients Rx coverage was changed in June 2017. Called patient back who states it had. Gave new Rx coverer who is now Metallurgist. Called [facility] who Did PA and patient was approved for 30 day supply for 36 months. Called patient to inform ans she stated she would go by there today . Called pharmacy who states patient paid cash for Ambien Rx on 11/14/15. She will not get another Rx for this medication until 12/15/15.

## 2015-11-28 ENCOUNTER — Other Ambulatory Visit: Payer: Self-pay | Admitting: Obstetrics and Gynecology

## 2015-11-28 DIAGNOSIS — Z1231 Encounter for screening mammogram for malignant neoplasm of breast: Secondary | ICD-10-CM

## 2015-11-29 DIAGNOSIS — M25561 Pain in right knee: Secondary | ICD-10-CM | POA: Diagnosis not present

## 2015-11-29 DIAGNOSIS — M25562 Pain in left knee: Secondary | ICD-10-CM | POA: Diagnosis not present

## 2015-12-05 NOTE — Telephone Encounter (Signed)
Conversation: Medication Refill  (Newest Message First)  Bunnie Domino, LPN    [id number] AM  Note    Patient calling to see if PA has been completed for Ambien. [place] who states patients Rx coverage was changed in June 2017. Called patient back who states it had. Gave new Rx coverer who is now Metallurgist. Called [facility] who Did PA and patient was approved for 30 day supply for 36 months. Called patient to inform ans she stated she would go by there today . Called pharmacy who states patient paid cash for Ambien Rx on 11/14/15. She will not get another Rx for this medication until 12/15/15.

## 2015-12-10 DIAGNOSIS — Z6829 Body mass index (BMI) 29.0-29.9, adult: Secondary | ICD-10-CM | POA: Diagnosis not present

## 2015-12-10 DIAGNOSIS — Z01419 Encounter for gynecological examination (general) (routine) without abnormal findings: Secondary | ICD-10-CM | POA: Diagnosis not present

## 2015-12-10 DIAGNOSIS — M8580 Other specified disorders of bone density and structure, unspecified site: Secondary | ICD-10-CM | POA: Diagnosis not present

## 2015-12-10 DIAGNOSIS — Z1231 Encounter for screening mammogram for malignant neoplasm of breast: Secondary | ICD-10-CM | POA: Diagnosis not present

## 2015-12-10 DIAGNOSIS — Z13820 Encounter for screening for osteoporosis: Secondary | ICD-10-CM | POA: Diagnosis not present

## 2015-12-10 LAB — HM MAMMOGRAPHY

## 2015-12-11 ENCOUNTER — Encounter: Payer: Self-pay | Admitting: Internal Medicine

## 2015-12-16 ENCOUNTER — Ambulatory Visit: Payer: BC Managed Care – PPO

## 2015-12-16 ENCOUNTER — Telehealth: Payer: Self-pay

## 2015-12-16 MED ORDER — ZOLPIDEM TARTRATE 10 MG PO TABS: 10.0000 mg | ORAL_TABLET | Freq: Every evening | ORAL | 5 refills | Status: DC | PRN

## 2015-12-16 NOTE — Telephone Encounter (Signed)
Okay #30 and 5 refills 

## 2015-12-16 NOTE — Telephone Encounter (Signed)
Rx printed, awaiting MD signature.  

## 2015-12-16 NOTE — Telephone Encounter (Signed)
Rx faxed to [facility] pharmacy.  

## 2015-12-16 NOTE — Telephone Encounter (Signed)
Pt is requesting refill on Ambien.  Last OV: 10/07/2015 Last Fill: 08/14/2015 #30 and 3RF UDS: Not needed for Ambien  Please advise.

## 2015-12-17 NOTE — Telephone Encounter (Signed)
Pt called in, she says that she checked with pharmacy and was advised that Rx hasn't been received. Informed pt of current status per CMA. Pt would like for our office to check into it because pt says that she hasnt slept in days due to her needing Rx.

## 2015-12-17 NOTE — Telephone Encounter (Signed)
Rx re-faxed to [place].

## 2015-12-22 DIAGNOSIS — M25562 Pain in left knee: Secondary | ICD-10-CM | POA: Diagnosis not present

## 2015-12-22 DIAGNOSIS — M25561 Pain in right knee: Secondary | ICD-10-CM | POA: Diagnosis not present

## 2016-01-07 DIAGNOSIS — Z23 Encounter for immunization: Secondary | ICD-10-CM | POA: Diagnosis not present

## 2016-02-01 ENCOUNTER — Other Ambulatory Visit: Payer: Self-pay | Admitting: Internal Medicine

## 2016-02-16 ENCOUNTER — Ambulatory Visit (INDEPENDENT_AMBULATORY_CARE_PROVIDER_SITE_OTHER): Payer: Medicare Other | Admitting: Internal Medicine

## 2016-02-16 ENCOUNTER — Encounter: Payer: Self-pay | Admitting: Internal Medicine

## 2016-02-16 VITALS — BP 124/68 | HR 69 | Temp 98.5°F | Resp 14 | Ht 68.0 in | Wt 193.5 lb

## 2016-02-16 DIAGNOSIS — I10 Essential (primary) hypertension: Secondary | ICD-10-CM

## 2016-02-16 DIAGNOSIS — F411 Generalized anxiety disorder: Secondary | ICD-10-CM | POA: Diagnosis not present

## 2016-02-16 LAB — BASIC METABOLIC PANEL
BUN: 26 mg/dL — ABNORMAL HIGH (ref 6–23)
CHLORIDE: 103 meq/L (ref 96–112)
CO2: 27 meq/L (ref 19–32)
Calcium: 10 mg/dL (ref 8.4–10.5)
Creatinine, Ser: 1.14 mg/dL (ref 0.40–1.20)
GFR: 61.62 mL/min (ref >60.00)
Glucose, Bld: 91 mg/dL (ref 70–99)
POTASSIUM: 4.1 meq/L (ref 3.5–5.1)
SODIUM: 136 meq/L (ref 135–145)

## 2016-02-16 NOTE — Progress Notes (Signed)
Subjective:    Patient ID: Leah Cameron, female    DOB: 12-Sep-1950, 65 y.o.   MRN: [id number]  DOS:  02/16/2016 Type of visit - description : Routine checkup Interval history: Was seen with chest pain, workup negative, no further symptoms Likes to decrease/stop Lexapro and Ambien, she feels well. Also wonder if Wellbutrin will be a good fit for her.   Review of Systems Denies chest pain, difficulty breathing. No lower extremity edema   Past Medical History:  Diagnosis Date  . Allergic rhinitis   . Back pain, chronic   . Fibromyalgia    sees Dr.Davenshwar  . GERD (gastroesophageal reflux disease)   . Hypertension   . Menopause    on lexapro was started by gyn  . RLS (restless legs syndrome)   . Scoliosis   . Stricture esophagus    last EGD and dilatation 3/09  . Syncope 4/11   admitted thought to be due to over treatment of hypertension  . Thyromegaly   . Vertigo    went to ED on 10/30    Past Surgical History:  Procedure Laterality Date  . BACK SURGERY  09-05-13   cage and rods L4 L5 (@ [facility])  . BIOPSY THYROID  01-2009   negative   . BREAST LUMPECTOMY  2000  . COLONOSCOPY    . KNEE ARTHROSCOPY  2003   right  . KNEE SURGERY  1999/2002   left   . SHOULDER SURGERY  2004  . TONSILLECTOMY    . TRIGGER FINGER RELEASE  08-2014    R THUMB    Social History   Social History  . Marital status: Married    Spouse name: N/A  . Number of children: 2  . Years of education: N/A   Occupational History  .  Retired 2001, Public relations account executive, disable      Social History Main Topics  . Smoking status: Never Smoker  . Smokeless tobacco: Never Used  . Alcohol use Yes     Comment: socially   . Drug use: No  . Sexual activity: Not on file   Other Topics Concern  . Not on file   Social History Narrative   Disable                    Medication List       Accurate as of 02/16/16 11:59 PM. Always use your most recent med list.          amLODipine  5 MG tablet Commonly known as:  NORVASC Take 1 tablet (5 mg total) by mouth daily.   aspirin 81 MG tablet Take 81 mg by mouth daily.   benazepril 40 MG tablet Commonly known as:  LOTENSIN Take 1 tablet (40 mg total) by mouth daily.   CALTRATE 600+D 600-400 MG-UNIT chew tablet Generic drug:  Calcium Carbonate-Vitamin D Chew 1 tablet by mouth 2 (two) times daily.   escitalopram 10 MG tablet Commonly known as:  LEXAPRO Take 1 tablet (10 mg total) by mouth daily.   lansoprazole 30 MG capsule Commonly known as:  PREVACID Take 1 capsule (30 mg total) by mouth 2 (two) times daily.   Magnesium 250 MG Tabs Take 1 tablet by mouth daily.   multivitamin with minerals Tabs tablet Take 1 tablet by mouth daily.   spironolactone 25 MG tablet Commonly known as:  ALDACTONE Take 1 tablet (25 mg total) by mouth every morning.   VITAMIN C PO Take 1 tablet  by mouth daily.   zolpidem 10 MG tablet Commonly known as:  AMBIEN Take 1 tablet (10 mg total) by mouth at bedtime as needed. for sleep          Objective:   Physical Exam BP 124/68 (BP Location: Left Arm, Patient Position: Sitting, Cuff Size: Normal)   Pulse 69   Temp 98.5 F (36.9 C) (Oral)   Resp 14   Ht 5\' 8"  (1.727 m)   Wt 193 lb 8 oz (87.8 kg)   SpO2 96%   BMI 29.42 kg/m  General:   Well developed, well nourished . NAD.  HEENT:  Normocephalic . Face symmetric, atraumatic Lungs:  CTA B Normal respiratory effort, no intercostal retractions, no accessory muscle use. Heart: RRR,  no murmur.  No pretibial edema bilaterally  Skin: Not pale. Not jaundice Neurologic:  alert & oriented X3.  Speech normal, gait appropriate for age and unassisted Psych--  Cognition and judgment appear intact.  Cooperative with normal attention span and concentration.  Behavior appropriate. No anxious or depressed appearing.      Assessment & Plan:  Assessment> HTN --hypokalemia, ?primary hyperaldosteronism Anxiety, insomnia ---  start to rx ambien here ~ 04-2015 (previously by rheumatology) Multinodular goiter, Dr. Dwyane Dee, seen 07-2014  ---01-2009 thyroid Bx (-), BX again 11-2014 GI: --GERD  --esophageal stricture, dilatation  2009 MSK: --fibromyalgia >>> stop seen Dr Herold Harms by the end of 2016 b/c she feels better  --Chronic back pain -- resolved after 2015 back surgery --RLS Syncope 2011 (? due to over rx of HTN) Self dc ASA 2016 d/t easy bruising   PLAN: Chest pain: Stress test (-) except for slightly decreased EF, ECHO normal. No further sx. Rec observation HTN: On benazepril, Aldactone, amlodipine. Check a BMP. Anxiety,   insomnia: Well-controlled on Lexapro and Ambien, would like to decrease the dose of medicines and hopefully stop them. Wonders a Wellbutrin would be a good fit for her. Explained that Wellbutrin mostly for depression but no for anxiety. We agreed to wean off Lexapro, decrease dose of Ambien, okay to go back on Lexapro if needed or do a trial with Wellbutrin. RTC 08-2016, CPX

## 2016-02-16 NOTE — Progress Notes (Signed)
Pre visit review using our clinic review tool, if applicable. No additional management support is needed unless otherwise documented below in the visit note. 

## 2016-02-16 NOTE — Patient Instructions (Signed)
GO TO THE LAB : Get the blood work     GO TO THE FRONT DESK Schedule your next appointment for a physical exam for 08-2016    lexapro 10 mg: take 1/2 tablet a day for 4-5 weeks, then stop. Go back on it if needed or call if you like to switch to wellbutrin  Ok to cut down [place] to 1/2 tablet as needed

## 2016-02-17 NOTE — Assessment & Plan Note (Signed)
Chest pain: Stress test (-) except for slightly decreased EF, ECHO normal. No further sx. Rec observation HTN: On benazepril, Aldactone, amlodipine. Check a BMP. Anxiety,   insomnia: Well-controlled on Lexapro and Ambien, would like to decrease the dose of medicines and hopefully stop them. Wonders a Wellbutrin would be a good fit for her. Explained that Wellbutrin mostly for depression but no for anxiety. We agreed to wean off Lexapro, decrease dose of Ambien, okay to go back on Lexapro if needed or do a trial with Wellbutrin. RTC 08-2016, CPX

## 2016-03-24 ENCOUNTER — Encounter: Payer: Self-pay | Admitting: Internal Medicine

## 2016-03-24 ENCOUNTER — Ambulatory Visit (INDEPENDENT_AMBULATORY_CARE_PROVIDER_SITE_OTHER): Payer: Medicare Other | Admitting: Internal Medicine

## 2016-03-24 VITALS — BP 122/78 | HR 68 | Temp 98.2°F | Resp 14 | Ht 68.0 in | Wt 193.0 lb

## 2016-03-24 DIAGNOSIS — J069 Acute upper respiratory infection, unspecified: Secondary | ICD-10-CM

## 2016-03-24 MED ORDER — AZELASTINE HCL 0.1 % NA SOLN: 2.0000 | Freq: Every evening | NASAL | 3 refills | Status: DC | PRN

## 2016-03-24 NOTE — Patient Instructions (Signed)
Rest, fluids , tylenol  For cough:  Take Mucinex DM twice a day as needed until better  For nasal congestion: Use OTC Nasocort or Flonase : 2 nasal sprays on each side of the nose in the morning until you feel better Use ASTELIN a prescribed spray : 2 nasal sprays on each side of the nose at night until you feel better   Avoid decongestants such as  Pseudoephedrine or phenylephrine     Call if not gradually better over the next  10 days  Call anytime if the symptoms are severe  

## 2016-03-24 NOTE — Progress Notes (Signed)
Pre visit review using our clinic review tool, if applicable. No additional management support is needed unless otherwise documented below in the visit note. 

## 2016-03-24 NOTE — Progress Notes (Signed)
Subjective:    Patient ID: Leah Cameron, female    DOB: 10-17-1950, 65 y.o.   MRN: [id number]  DOS:  03/24/2016 Type of visit - description : Acute visit Interval history: Symptoms started 8 days ago: Sinus congestion, blowing yellowish discharge from the nose, cough. Denies sinus pain. Concerned because she is not getting better after 8 days.   Review of Systems Denies fever chills. Asthma is unaffected Denies myalgias, nausea, vomiting. No rash or headache  Past Medical History:  Diagnosis Date  . Allergic rhinitis   . Back pain, chronic   . Fibromyalgia    sees Dr.Davenshwar  . GERD (gastroesophageal reflux disease)   . Hypertension   . Menopause    on lexapro was started by gyn  . RLS (restless legs syndrome)   . Scoliosis   . Stricture esophagus    last EGD and dilatation 3/09  . Syncope 4/11   admitted thought to be due to over treatment of hypertension  . Thyromegaly   . Vertigo    went to ED on 10/30    Past Surgical History:  Procedure Laterality Date  . BACK SURGERY  09-05-13   cage and rods L4 L5 (@ [facility])  . BIOPSY THYROID  01-2009   negative   . BREAST LUMPECTOMY  2000  . COLONOSCOPY    . KNEE ARTHROSCOPY  2003   right  . KNEE SURGERY  1999/2002   left   . SHOULDER SURGERY  2004  . TONSILLECTOMY    . TRIGGER FINGER RELEASE  08-2014    R THUMB    Social History   Social History  . Marital status: Married    Spouse name: N/A  . Number of children: 2  . Years of education: N/A   Occupational History  .  Retired 2001, Public relations account executive, disable      Social History Main Topics  . Smoking status: Never Smoker  . Smokeless tobacco: Never Used  . Alcohol use Yes     Comment: socially   . Drug use: No  . Sexual activity: Not on file   Other Topics Concern  . Not on file   Social History Narrative   Disable                    Medication List       Accurate as of 03/24/16 11:59 PM. Always use your most recent med  list.          amLODipine 5 MG tablet Commonly known as:  NORVASC Take 1 tablet (5 mg total) by mouth daily.   aspirin 81 MG tablet Take 81 mg by mouth daily.   azelastine 0.1 % nasal spray Commonly known as:  ASTELIN Place 2 sprays into both nostrils at bedtime as needed for rhinitis. Use in each nostril as directed   benazepril 40 MG tablet Commonly known as:  LOTENSIN Take 1 tablet (40 mg total) by mouth daily.   CALTRATE 600+D 600-400 MG-UNIT chew tablet Generic drug:  Calcium Carbonate-Vitamin D Chew 1 tablet by mouth 2 (two) times daily.   escitalopram 10 MG tablet Commonly known as:  LEXAPRO Take 1 tablet (10 mg total) by mouth daily.   lansoprazole 30 MG capsule Commonly known as:  PREVACID Take 1 capsule (30 mg total) by mouth 2 (two) times daily.   Magnesium 250 MG Tabs Take 1 tablet by mouth daily.   multivitamin with minerals Tabs tablet Take 1 tablet by  mouth daily.   spironolactone 25 MG tablet Commonly known as:  ALDACTONE Take 1 tablet (25 mg total) by mouth every morning.   VITAMIN C PO Take 1 tablet by mouth daily.   zolpidem 10 MG tablet Commonly known as:  AMBIEN Take 1 tablet (10 mg total) by mouth at bedtime as needed. for sleep          Objective:   Physical Exam BP 122/78 (BP Location: Left Arm, Patient Position: Sitting, Cuff Size: Normal)   Pulse 68   Temp 98.2 F (36.8 C) (Oral)   Resp 14   Ht 5\' 8"  (1.727 m)   Wt 193 lb (87.5 kg)   SpO2 95%   BMI 29.35 kg/m  General:   Well developed, well nourished . NAD.  HEENT:  Normocephalic . Face symmetric, atraumatic. TMs normal, nose is slightly congested, sinuses no TTP. Throat symmetric, no red Lungs:  CTA B Normal respiratory effort, no intercostal retractions, no accessory muscle use. Heart: RRR,  no murmur.  No pretibial edema bilaterally  Skin: Not pale. Not jaundice Neurologic:  alert & oriented X3.  Speech normal, gait appropriate for age and unassisted Psych--    Cognition and judgment appear intact.  Cooperative with normal attention span and concentration.  Behavior appropriate. No anxious or depressed appearing.      Assessment & Plan:   Assessment> HTN --hypokalemia, ?primary hyperaldosteronism Anxiety, insomnia --- start to rx ambien here ~ 04-2015 (previously by rheumatology) Multinodular goiter, Dr. Dwyane Dee, seen 07-2014  ---01-2009 thyroid Bx (-), BX again 11-2014 GI: --GERD  --esophageal stricture, dilatation  2009 MSK: --fibromyalgia >>> stop seen Dr Herold Harms by the end of 2016 b/c she felt  better  --Chronic back pain -- resolved after 2015 back surgery --RLS Syncope 2011 (? due to over rx of HTN) Self dc ASA 2016 d/t easy bruising   PLAN:  URI: Recommend conservative treatment with Flonase, Astelin, Mucinex. Call if no better. Patient in agreement.

## 2016-03-25 NOTE — Assessment & Plan Note (Signed)
URI: Recommend conservative treatment with Flonase, Astelin, Mucinex. Call if no better. Patient in agreement.

## 2016-03-30 ENCOUNTER — Telehealth: Payer: Self-pay | Admitting: Internal Medicine

## 2016-03-30 MED ORDER — AZITHROMYCIN 250 MG PO TABS
ORAL_TABLET | ORAL | 0 refills | Status: DC
Start: 2016-03-30 — End: 2016-09-01

## 2016-03-30 NOTE — Telephone Encounter (Signed)
Zpak sent to [facility], #6 and 0RF. LMOM informing Pt that Rx has been sent.

## 2016-03-30 NOTE — Telephone Encounter (Signed)
Pt seen w/ URI on 03/24/2016. Please advise.

## 2016-03-30 NOTE — Addendum Note (Signed)
Addended byDamita Dunnings D on: 03/30/2016 04:41 PM   Modules accepted: Orders

## 2016-03-30 NOTE — Telephone Encounter (Signed)
Caller name: Relationship to patient: Self Can be reached: [phone]  Pharmacy:  [facility],  - [address] [phone] (Phone) [phone] (Fax)     Reason for call: States she saw Dr. Larose Kells last week and was informed to call back if she did not get any better. States she is no better and needs an antibiotic called in to the pharmacy. States she would like a call back from the nurse also.

## 2016-03-30 NOTE — Telephone Encounter (Signed)
Okay, will send a Z-Pak. Office visit if she is not improving after the antibiotics.

## 2016-03-30 NOTE — Telephone Encounter (Signed)
Pt husband Leah Cameron) came to the office to verify if rx was sent to pharmacy, pt's spouse was informed PCP has not seen note yet, pt's spouse ok with it  and stated already is leaving the office so it would be ok to call his wife when rx is sent to the pharmacy mentioned below.

## 2016-03-30 NOTE — Telephone Encounter (Signed)
Pt's husband is volunteering downstairs at front desk, and came up to the office to see if Dr. Larose Kells had responded to the message sent earlier husband states he will be working until 4pm and would like to know prior to leaving if Dr. Larose Kells is going to call in anything for the pt, so he can pick it up once he leaves here today.

## 2016-04-01 ENCOUNTER — Telehealth: Payer: Self-pay | Admitting: Internal Medicine

## 2016-04-01 NOTE — Telephone Encounter (Signed)
Caller name:[place], Delaney Meigs Relation to PO:[id number]  Call back number:[phone] Pharmacy: [facility] - [place], [place] - [address] [phone] (Phone) [phone] (Fax)     Reason for call:  Patient experiencing cough requesting PCP to prescribe Rx, patient would like to speak with nurse directly,please advise

## 2016-04-01 NOTE — Telephone Encounter (Signed)
Unable to reach patient regarding the below medical concern. Left message for patient to return call when available.

## 2016-04-02 MED ORDER — BENZONATATE 200 MG PO CAPS: 200.0000 mg | ORAL_CAPSULE | Freq: Three times a day (TID) | ORAL | 0 refills | Status: DC | PRN

## 2016-04-02 NOTE — Telephone Encounter (Addendum)
Informed patient of the provider's recommendations below. She voiced understanding and did not have any further concerns prior to call ending. Rx sent to the preferred pharmacy.

## 2016-04-02 NOTE — Telephone Encounter (Signed)
In addition to OTC Mucinex DM twice a day I recommend Tessalon Perles 200 mg one by mouth 3 times a day when necessary #21, no refills.

## 2016-04-02 NOTE — Telephone Encounter (Signed)
Called & follow-up with the patient regarding cough & request for Rx. She was last seen on 03/24/16 for an upper respiratory infection. Per chart, a Z-pack was ordered on 12/19 due to worsening symptoms. Patient reports improvement with symptoms, but since then has developed a cough w/ clear phlegm x 3 days; no other symptoms present at this time. She is requesting a prescription to help aide with her cough.   Please advise. Thanks.

## 2016-04-02 NOTE — Addendum Note (Signed)
Addended by: Kathlen Brunswick on: 04/02/2016 03:44 PM   Modules accepted: Orders

## 2016-04-26 ENCOUNTER — Other Ambulatory Visit: Payer: Self-pay | Admitting: Internal Medicine

## 2016-05-29 ENCOUNTER — Other Ambulatory Visit: Payer: Self-pay | Admitting: Internal Medicine

## 2016-06-05 ENCOUNTER — Other Ambulatory Visit: Payer: Self-pay | Admitting: Internal Medicine

## 2016-06-14 ENCOUNTER — Telehealth: Payer: Self-pay

## 2016-06-14 MED ORDER — ZOLPIDEM TARTRATE 10 MG PO TABS: 10.0000 mg | ORAL_TABLET | Freq: Every evening | ORAL | 5 refills | Status: DC | PRN

## 2016-06-14 NOTE — Telephone Encounter (Signed)
Pt is requesting refill on Ambien.  Last OV: 03/24/2016 Last Fill: 12/16/2015 #30 and 5RF UDS: Not needed for Ambien per PCP  Please advise.

## 2016-06-14 NOTE — Telephone Encounter (Signed)
Ok 30 and 5 RF 

## 2016-06-14 NOTE — Telephone Encounter (Signed)
Rx printed, awaiting MD signature.  

## 2016-06-14 NOTE — Telephone Encounter (Signed)
Rx faxed to [facility] pharmacy.  

## 2016-07-15 ENCOUNTER — Other Ambulatory Visit: Payer: Self-pay | Admitting: Internal Medicine

## 2016-08-18 ENCOUNTER — Encounter: Payer: BC Managed Care – PPO | Admitting: Internal Medicine

## 2016-09-01 ENCOUNTER — Encounter: Payer: Self-pay | Admitting: Internal Medicine

## 2016-09-01 ENCOUNTER — Ambulatory Visit (INDEPENDENT_AMBULATORY_CARE_PROVIDER_SITE_OTHER): Payer: Medicare Other | Admitting: Internal Medicine

## 2016-09-01 VITALS — BP 126/74 | HR 72 | Temp 98.2°F | Resp 14 | Ht 68.0 in | Wt 187.4 lb

## 2016-09-01 DIAGNOSIS — Z0000 Encounter for general adult medical examination without abnormal findings: Secondary | ICD-10-CM | POA: Diagnosis not present

## 2016-09-01 DIAGNOSIS — Z780 Asymptomatic menopausal state: Secondary | ICD-10-CM

## 2016-09-01 DIAGNOSIS — M8580 Other specified disorders of bone density and structure, unspecified site: Secondary | ICD-10-CM

## 2016-09-01 NOTE — Progress Notes (Signed)
Pre visit review using our clinic review tool, if applicable. No additional management support is needed unless otherwise documented below in the visit note. 

## 2016-09-01 NOTE — Progress Notes (Signed)
Subjective:    Patient ID: Leah Cameron, female    DOB: 03/08/1951, 66 y.o.   MRN: [id number]  DOS:  09/01/2016 Type of visit - description : cpx Interval history: No major concerns, good medication compliance.   Review of Systems   A 14 point review of systems is negative   Past Medical History:  Diagnosis Date  . Allergic rhinitis   . Back pain, chronic   . Fibromyalgia    sees Dr.Davenshwar  . GERD (gastroesophageal reflux disease)   . Hypertension   . Menopause    on lexapro was started by gyn  . RLS (restless legs syndrome)   . Scoliosis   . Stricture esophagus    last EGD and dilatation 3/09  . Syncope 4/11   admitted thought to be due to over treatment of hypertension  . Thyromegaly   . Vertigo    went to ED on 10/30    Past Surgical History:  Procedure Laterality Date  . BACK SURGERY  09-05-13   cage and rods L4 L5 (@ [facility])  . BIOPSY THYROID  01-2009   negative   . BREAST LUMPECTOMY  2000  . COLONOSCOPY    . KNEE ARTHROSCOPY  2003   right  . KNEE SURGERY  1999/2002   left   . SHOULDER SURGERY  2004  . TONSILLECTOMY    . TRIGGER FINGER RELEASE  08-2014    R THUMB    Social History   Social History  . Marital status: Married    Spouse name: N/A  . Number of children: 2  . Years of education: N/A   Occupational History  .  Retired 2001, Public relations account executive, disable      Social History Main Topics  . Smoking status: Never Smoker  . Smokeless tobacco: Never Used  . Alcohol use Yes     Comment: socially   . Drug use: No  . Sexual activity: Not on file   Other Topics Concern  . Not on file   Social History Narrative   Disable                 Family History  Problem Relation Age of Onset  . Stroke Mother        M, F, 1/2 sister, sister   . Thyroid disease Mother   . Breast cancer Other        1/2 sister  . Diabetes Other        aunt  . Coronary artery disease Other        GF late in life  . Lung cancer Other    cousin  . Stroke Sister   . Dementia Sister   . Colon cancer Neg Hx      Allergies as of 09/01/2016      Reactions   Cyclobenzaprine Hcl Other (See Comments)   Face swelling   Chlorzoxazone Rash, Swelling   Hydrocodone-acetaminophen Rash   Penicillins Swelling, Rash   Tolerated cefazolin   Tramadol Hcl Itching, Rash      Medication List       Accurate as of 09/01/16  3:44 PM. Always use your most recent med list.          amLODipine 5 MG tablet Commonly known as:  NORVASC Take 1 tablet (5 mg total) by mouth daily.   aspirin 81 MG tablet Take 81 mg by mouth daily.   azelastine 0.1 % nasal spray Commonly known as:  ASTELIN Place  2 sprays into both nostrils at bedtime as needed for rhinitis. Use in each nostril as directed   azithromycin 250 MG tablet Commonly known as:  ZITHROMAX Take 2 tablets by mouth on the first day, then 1 tablet daily every day thereafter until gone.   benazepril 40 MG tablet Commonly known as:  LOTENSIN Take 1 tablet (40 mg total) by mouth daily.   benzonatate 200 MG capsule Commonly known as:  TESSALON Take 1 capsule (200 mg total) by mouth 3 (three) times daily as needed for cough.   CALTRATE 600+D 600-400 MG-UNIT chew tablet Generic drug:  Calcium Carbonate-Vitamin D Chew 1 tablet by mouth 2 (two) times daily.   escitalopram 10 MG tablet Commonly known as:  LEXAPRO Take 1 tablet (10 mg total) by mouth daily.   lansoprazole 30 MG capsule Commonly known as:  PREVACID Take 1 capsule (30 mg total) by mouth 2 (two) times daily before a meal.   Magnesium 250 MG Tabs Take 1 tablet by mouth daily.   multivitamin with minerals Tabs tablet Take 1 tablet by mouth daily.   spironolactone 25 MG tablet Commonly known as:  ALDACTONE Take 1 tablet (25 mg total) by mouth daily.   VITAMIN C PO Take 1 tablet by mouth daily.   zolpidem 10 MG tablet Commonly known as:  AMBIEN Take 1 tablet (10 mg total) by mouth at bedtime as needed. for  sleep          Objective:   Physical Exam BP 126/74 (BP Location: Left Arm, Patient Position: Sitting, Cuff Size: Normal)   Pulse 72   Temp 98.2 F (36.8 C) (Oral)   Resp 14   Ht 5\' 8"  (1.727 m)   Wt 187 lb 6 oz (85 kg)   SpO2 98%   BMI 28.49 kg/m   General:   Well developed, well nourished . NAD.  Neck: + Thyromegaly, soft, not nodular or tender. HEENT:  Normocephalic . Face symmetric, atraumatic Lungs:  CTA B Normal respiratory effort, no intercostal retractions, no accessory muscle use. Heart: RRR,  no murmur.  No pretibial edema bilaterally  Abdomen:  Not distended, soft, non-tender. No rebound or rigidity.   Skin: Exposed areas without rash. Not pale. Not jaundice Neurologic:  alert & oriented X3.  Speech normal, gait appropriate for age and unassisted Strength symmetric and appropriate for age.  Psych: Cognition and judgment appear intact.  Cooperative with normal attention span and concentration.  Behavior appropriate. No anxious or depressed appearing.    Assessment & Plan:    Assessment> HTN --hypokalemia, ?primary hyperaldosteronism Anxiety, insomnia --- start to rx ambien here ~ 04-2015 (previously by rheumatology) Multinodular goiter, Dr. Dwyane Dee, seen 07-2014  ---01-2009 thyroid Bx (-), BX again 11-2014 GI: --GERD  --esophageal stricture, dilatation  2009 MSK: --fibromyalgia >>> stop seen Dr Herold Harms by the end of 2016 b/c she felt  better  --Chronic back pain -- resolved after 2015 back surgery --RLS -- Osteopenia: Bone Density-- 11/28/13 at [facility] on ca and vit d Syncope 2011 (? due to over rx of HTN) Self dc ASA 2016 d/t easy bruising   PLAN:  HTN: Continue amlodipine, Lotensin, Aldactone. Checking labs Anxiety and insomnia: Continue Lexapro and Ambien, previously she tried to decrease or stop Ambien and that didn't work. Multinodular goiter: Exam with no worrisome features, last BX 2016 GERD:  controlled Osteopenia: last DEXA at [facility]  11-2013. Continue calcium and vitamin D RTC 6 months

## 2016-09-01 NOTE — Patient Instructions (Signed)
    GO TO THE FRONT DESK Schedule labs to be done fasting in the next few days  Schedule your next appointment for a  routine checkup in 6 months

## 2016-09-01 NOTE — Assessment & Plan Note (Signed)
--  Td 7-09;  Zostavax: 2015; shingrex discussed -- Female care:  Pap per Dr Renelda Loma,  2015; MMG last 11-2015 -- CCS: Colonoscopy:  01/10/2002 and 10- 2013, neg , 10 years  -- Labs CMP, FLP, CBC, TSH -- Diet and exercise discussed

## 2016-09-02 NOTE — Assessment & Plan Note (Signed)
HTN: Continue amlodipine, Lotensin, Aldactone. Checking labs Anxiety and insomnia: Continue Lexapro and Ambien, previously she tried to decrease or stop Ambien and that didn't work. Multinodular goiter: Exam with no worrisome features, last BX 2016 GERD:  controlled Osteopenia: last DEXA at [facility] 11-2013. Continue calcium and vitamin D RTC 6 months

## 2016-09-03 ENCOUNTER — Other Ambulatory Visit (INDEPENDENT_AMBULATORY_CARE_PROVIDER_SITE_OTHER): Payer: Medicare Other

## 2016-09-03 DIAGNOSIS — Z0000 Encounter for general adult medical examination without abnormal findings: Secondary | ICD-10-CM | POA: Diagnosis not present

## 2016-09-03 LAB — COMPREHENSIVE METABOLIC PANEL
ALK PHOS: 84 U/L (ref 39–117)
ALT: 22 U/L (ref 0–35)
AST: 20 U/L (ref 0–37)
Albumin: 4.6 g/dL (ref 3.5–5.2)
BUN: 24 mg/dL — ABNORMAL HIGH (ref 6–23)
CALCIUM: 10.1 mg/dL (ref 8.4–10.5)
CO2: 28 meq/L (ref 19–32)
CREATININE: 1.01 mg/dL (ref 0.40–1.20)
Chloride: 103 mEq/L (ref 96–112)
GFR: 70.74 mL/min (ref >60.00)
GLUCOSE: 107 mg/dL — AB (ref 70–99)
Potassium: 4.2 mEq/L (ref 3.5–5.1)
Sodium: 137 mEq/L (ref 135–145)
TOTAL PROTEIN: 7.3 g/dL (ref 6.0–8.3)
Total Bilirubin: 0.3 mg/dL (ref 0.2–1.2)

## 2016-09-03 LAB — LIPID PANEL
CHOL/HDL RATIO: 4
Cholesterol: 164 mg/dL (ref 0–200)
HDL: 40.5 mg/dL (ref >39.00)
LDL Cholesterol: 105 mg/dL — ABNORMAL HIGH (ref 0–99)
NONHDL: 123.56
Triglycerides: 91 mg/dL (ref 0.0–149.0)
VLDL: 18.2 mg/dL (ref 0.0–40.0)

## 2016-09-03 LAB — CBC WITH DIFFERENTIAL/PLATELET
BASOS ABS: 0.1 10*3/uL (ref 0.0–0.1)
Basophils Relative: 1.2 % (ref 0.0–3.0)
EOS PCT: 3.2 % (ref 0.0–5.0)
Eosinophils Absolute: 0.2 10*3/uL (ref 0.0–0.7)
HEMATOCRIT: 39.9 % (ref 36.0–46.0)
HEMOGLOBIN: 12.9 g/dL (ref 12.0–15.0)
LYMPHS PCT: 37.6 % (ref 12.0–46.0)
Lymphs Abs: 2 10*3/uL (ref 0.7–4.0)
MCHC: 32.2 g/dL (ref 30.0–36.0)
MCV: 83.2 fl (ref 78.0–100.0)
MONOS PCT: 6.8 % (ref 3.0–12.0)
Monocytes Absolute: 0.4 10*3/uL (ref 0.1–1.0)
Neutro Abs: 2.8 10*3/uL (ref 1.4–7.7)
Neutrophils Relative %: 51.2 % (ref 43.0–77.0)
Platelets: 156 10*3/uL (ref 150.0–400.0)
RBC: 4.8 Mil/uL (ref 3.87–5.11)
RDW: 14.3 % (ref 11.5–15.5)
WBC: 5.4 10*3/uL (ref 4.0–10.5)

## 2016-09-03 LAB — TSH: TSH: 0.63 u[IU]/mL (ref 0.35–4.50)

## 2016-09-09 ENCOUNTER — Encounter: Payer: Self-pay | Admitting: Internal Medicine

## 2016-09-09 DIAGNOSIS — M8589 Other specified disorders of bone density and structure, multiple sites: Secondary | ICD-10-CM | POA: Diagnosis not present

## 2016-09-09 DIAGNOSIS — M810 Age-related osteoporosis without current pathological fracture: Secondary | ICD-10-CM | POA: Diagnosis not present

## 2016-09-13 ENCOUNTER — Telehealth: Payer: Self-pay

## 2016-09-13 NOTE — Telephone Encounter (Signed)
Received PA approval through 04/11/2017.

## 2016-09-13 NOTE — Telephone Encounter (Signed)
PA initiated via Covermymeds; KEY: [id number]. Awaiting determination.

## 2016-09-13 NOTE — Telephone Encounter (Signed)
Received PA form via fax from [employer]. Form completed and faxed to [phone]. Forms sent for scanning.

## 2016-11-03 ENCOUNTER — Other Ambulatory Visit: Payer: Self-pay | Admitting: Internal Medicine

## 2016-11-15 ENCOUNTER — Telehealth: Payer: Self-pay

## 2016-11-15 NOTE — Telephone Encounter (Signed)
Pt is requesting refill on Ambien 10mg .  Last OV: 09/01/2016 Last Fill: 06/14/2016 #30 and 5RF UDS: Not needed for Ambien per PCP  [place] Controlled Substance Database printed; no issues noted.   Please advise.

## 2016-11-15 NOTE — Telephone Encounter (Signed)
Advise patient what her insurance is requesting, I don't have a problem switching her to Lunesta 2 mg one tablet at bedtime as needed.

## 2016-11-15 NOTE — Telephone Encounter (Signed)
Received PA request, started to complete form, however, insurance will not approve w/o failure, contraindication or intolerance to:  Belsomra Eszopiclone (Lunesta) Rozerem Trazodone Zaleplon   Per Pt history, she hasn't tried and failed any of the above. Please advise.

## 2016-11-16 MED ORDER — ESZOPICLONE 2 MG PO TABS: 2.0000 mg | ORAL_TABLET | Freq: Every evening | ORAL | 1 refills | Status: DC | PRN

## 2016-11-16 NOTE — Telephone Encounter (Signed)
Patient called in checking on the status of message below stating she's willing to try the lunesta and would like Rx sent to [facility], [place] - [address] AT [address] [phone] (Phone) [phone] (Fax)

## 2016-11-16 NOTE — Telephone Encounter (Signed)
Ambien 10mg  d/c, Lunesta 2mg  1 tab qhs prn, #30 and 1 RF printed, awaiting MD signature.

## 2016-11-16 NOTE — Telephone Encounter (Signed)
MyChart message sent to Pt. Awaiting response.  

## 2016-11-16 NOTE — Telephone Encounter (Signed)
Rx faxed to [facility] pharmacy.  

## 2016-11-17 ENCOUNTER — Telehealth: Payer: Self-pay

## 2016-11-17 NOTE — Telephone Encounter (Signed)
PA initiated via Covermymeds; KEY: [id number]. Awaiting determination.

## 2016-11-17 NOTE — Telephone Encounter (Signed)
PA denied, Pt must first try and fail Belsomra, Rozerem and Trazodone before Johnnye Sima will be covered. Please advise.

## 2016-11-17 NOTE — Telephone Encounter (Signed)
Advise pt of her insurance decision, she could buy her medication out of pocket or she could try trazodone 50 mg 1 by mouth daily at bedtime when necessary #30 no refills

## 2016-11-18 MED ORDER — ZOLPIDEM TARTRATE 10 MG PO TABS: 10.0000 mg | ORAL_TABLET | Freq: Every evening | ORAL | 5 refills | Status: DC | PRN

## 2016-11-18 NOTE — Telephone Encounter (Signed)
Rx faxed to [facility] pharmacy.  

## 2016-11-18 NOTE — Telephone Encounter (Signed)
Received MyChart message- Pt will buy Ambien out of pocket since she knows it works. Please advise dosage. I believe she was on 10mg  1 tab qhs prn.

## 2016-11-18 NOTE — Telephone Encounter (Signed)
Rx printed, awaiting MD signature.  

## 2016-11-18 NOTE — Telephone Encounter (Signed)
Okay #30 and 5 refills 

## 2016-12-03 ENCOUNTER — Other Ambulatory Visit: Payer: Self-pay | Admitting: Internal Medicine

## 2016-12-21 ENCOUNTER — Ambulatory Visit (INDEPENDENT_AMBULATORY_CARE_PROVIDER_SITE_OTHER): Payer: Medicare Other | Admitting: Internal Medicine

## 2016-12-21 ENCOUNTER — Encounter: Payer: Self-pay | Admitting: Internal Medicine

## 2016-12-21 VITALS — BP 128/74 | HR 78 | Temp 98.4°F | Resp 14 | Ht 68.0 in | Wt 191.1 lb

## 2016-12-21 DIAGNOSIS — M8580 Other specified disorders of bone density and structure, unspecified site: Secondary | ICD-10-CM

## 2016-12-21 DIAGNOSIS — J069 Acute upper respiratory infection, unspecified: Secondary | ICD-10-CM

## 2016-12-21 DIAGNOSIS — I10 Essential (primary) hypertension: Secondary | ICD-10-CM

## 2016-12-21 MED ORDER — PREDNISONE 10 MG PO TABS: ORAL_TABLET | ORAL | 0 refills | Status: DC

## 2016-12-21 MED ORDER — HYDROCORTISONE 2.5 % EX CREA: TOPICAL_CREAM | Freq: Two times a day (BID) | CUTANEOUS | 0 refills | Status: AC

## 2016-12-21 NOTE — Patient Instructions (Addendum)
GO TO THE FRONT DESK Schedule your next appointment for a  checkup in 4 months  For the allergic reaction: Continue Benadryl Hydrocortisone cream twice a day as needed Redness on for a few days  For the respiratory infection: Flonase as needed Mucinex DM Call if no better

## 2016-12-21 NOTE — Progress Notes (Signed)
Subjective:    Patient ID: Leah Cameron, female    DOB: 1950/08/08, 66 y.o.   MRN: [id number]  DOS:  12/21/2016 Type of visit - description : rov Interval history: Around 12/14/2016 she   spent some time on her yard, shortly after developed swelling and severe itching at the left ankle, then the  swelling, itching migrated to the posterior aspect of the right knee, then right arm and now left arm. She also the very low URI type of symptoms mostly throat and chest congestion. Went to urgent care 12/18/2016, was told she probably had cellulitis at the right upper extremity and prescribed doxycycline and Benadryl. Since then the areas of redness continued to migrate.   Review of Systems As far as the URI sx: + Subjective fever Mild cough, was worse with the onset of URI symptoms. Some clear sputum.   Past Medical History:  Diagnosis Date  . Allergic rhinitis   . Back pain, chronic   . Fibromyalgia    sees Dr.Davenshwar  . GERD (gastroesophageal reflux disease)   . Hypertension   . Menopause    on lexapro was started by gyn  . RLS (restless legs syndrome)   . Scoliosis   . Stricture esophagus    last EGD and dilatation 3/09  . Syncope 4/11   admitted thought to be due to over treatment of hypertension  . Thyromegaly   . Vertigo    went to ED on 10/30    Past Surgical History:  Procedure Laterality Date  . BACK SURGERY  09-05-13   cage and rods L4 L5 (@ [facility])  . BIOPSY THYROID  01-2009   negative   . BREAST LUMPECTOMY  2000  . COLONOSCOPY    . KNEE ARTHROSCOPY  2003   right  . KNEE SURGERY  1999/2002   left   . SHOULDER SURGERY  2004  . TONSILLECTOMY    . TRIGGER FINGER RELEASE  08-2014    R THUMB    Social History   Social History  . Marital status: Married    Spouse name: N/A  . Number of children: 2  . Years of education: N/A   Occupational History  .  Retired 2001, Public relations account executive, disable      Social History Main Topics  . Smoking status:  Never Smoker  . Smokeless tobacco: Never Used  . Alcohol use Yes     Comment: socially   . Drug use: No  . Sexual activity: Not on file   Other Topics Concern  . Not on file   Social History Narrative   Disable                  Allergies as of 12/21/2016      Reactions   Cyclobenzaprine Hcl Other (See Comments)   Face swelling   Chlorzoxazone Rash, Swelling   Hydrocodone-acetaminophen Rash   Penicillins Swelling, Rash   Tolerated cefazolin   Tramadol Hcl Itching, Rash      Medication List       Accurate as of 12/21/16 11:59 PM. Always use your most recent med list.          amLODipine 5 MG tablet Commonly known as:  NORVASC Take 1 tablet (5 mg total) by mouth daily.   azelastine 0.1 % nasal spray Commonly known as:  ASTELIN Place 2 sprays into both nostrils at bedtime as needed for rhinitis. Use in each nostril as directed   benazepril 40 MG  tablet Commonly known as:  LOTENSIN Take 1 tablet (40 mg total) by mouth daily.   CALTRATE 600+D 600-400 MG-UNIT chew tablet Generic drug:  Calcium Carbonate-Vitamin D Chew 1 tablet by mouth 2 (two) times daily.   doxycycline 100 MG capsule Commonly known as:  VIBRAMYCIN Take 100 mg by mouth 2 (two) times daily.   escitalopram 10 MG tablet Commonly known as:  LEXAPRO Take 1 tablet (10 mg total) by mouth daily.   hydrocortisone 2.5 % cream Apply topically 2 (two) times daily.   lansoprazole 30 MG capsule Commonly known as:  PREVACID Take 1 capsule (30 mg total) by mouth 2 (two) times daily before a meal.   multivitamin with minerals Tabs tablet Take 1 tablet by mouth daily.   predniSONE 10 MG tablet Commonly known as:  DELTASONE 4 tablets x 2 days, 3 tabs x 2 days, 2 tabs x 2 days, 1 tab x 2 days   spironolactone 25 MG tablet Commonly known as:  ALDACTONE Take 1 tablet (25 mg total) by mouth daily.   VITAMIN C PO Take 1 tablet by mouth daily.   zolpidem 10 MG tablet Commonly known as:  AMBIEN Take  1 tablet (10 mg total) by mouth at bedtime as needed for sleep.            Discharge Care Instructions        Start     Ordered   12/21/16 0000  hydrocortisone 2.5 % cream  2 times daily     12/21/16 1119   12/21/16 0000  predniSONE (DELTASONE) 10 MG tablet     12/21/16 1119         Objective:   Physical Exam BP 128/74 (BP Location: Left Arm, Patient Position: Sitting, Cuff Size: Normal)   Pulse 78   Temp 98.4 F (36.9 C) (Oral)   Resp 14   Ht 5\' 8"  (1.727 m)   Wt 191 lb 2 oz (86.7 kg)   SpO2 97%   BMI 29.06 kg/m  General:   Well developed, well nourished . NAD.  HEENT:  Normocephalic . Face symmetric, atraumatic . TMs normal, nose is slightly congested, throat without redness. Symmetric. Lungs:  CTA B Normal respiratory effort, no intercostal retractions, no accessory muscle use. Heart: RRR,  no murmur.  No pretibial edema bilaterally  Skin: At the left arm has a area of mild swelling, redness, warmness. No TTP. No sharp demarcation. See picture Neurologic:  alert & oriented X3.  Speech normal, gait appropriate for age and unassisted Psych--  Cognition and judgment appear intact.  Cooperative with normal attention span and concentration.  Behavior appropriate. No anxious or depressed appearing.        Assessment & Plan:    Assessment HTN --hypokalemia, ?primary hyperaldosteronism Anxiety, insomnia --- start to rx ambien here ~ 04-2015 (previously by rheumatology) Multinodular goiter, Dr. Dwyane Dee, seen 07-2014  ---01-2009 thyroid Bx (-), BX again 11-2014 GI: --GERD  --esophageal stricture, dilatation  2009 MSK: --fibromyalgia >>> stop seen Dr Herold Harms by the end of 2016 b/c she felt  better  --Chronic back pain -- resolved after 2015 back surgery --RLS -- Osteopenia: DEXA  11/28/13 Tscore -2.7; DEXA 09/09/16 T score -2.4 Syncope 2011 (? due to over rx of HTN) Self dc ASA 2016 d/t easy bruising   PLAN: URI: Recommend conservative treatment,already  on an ABX RX at the urgent care Allergic reaction:  migratory redness and itching after she spent some time in the yard, doubt cellulitis, most  likely an allergic reaction. Recommend topical hydrocortisone cream and prednisone for a few days. HTN: Seems to be well-controlled Osteopenia: last DEXA 09-09-16:  T score -2.4, right hip, previous T score 2015 it was -2.7. Rec to continue calcium and vitamin D  RTC 4 months

## 2016-12-21 NOTE — Progress Notes (Signed)
Pre visit review using our clinic review tool, if applicable. No additional management support is needed unless otherwise documented below in the visit note. 

## 2016-12-22 DIAGNOSIS — M8580 Other specified disorders of bone density and structure, unspecified site: Secondary | ICD-10-CM | POA: Insufficient documentation

## 2016-12-22 NOTE — Assessment & Plan Note (Signed)
URI: Recommend conservative treatment,already on an ABX RX at the urgent care Allergic reaction:  migratory redness and itching after she spent some time in the yard, doubt cellulitis, most likely an allergic reaction. Recommend topical hydrocortisone cream and prednisone for a few days. HTN: Seems to be well-controlled Osteopenia: last DEXA 09-09-16:  T score -2.4, right hip, previous T score 2015 it was -2.7. Rec to continue calcium and vitamin D  RTC 4 months

## 2017-02-25 ENCOUNTER — Ambulatory Visit: Payer: Medicare Other | Admitting: Internal Medicine

## 2017-02-25 ENCOUNTER — Encounter: Payer: Self-pay | Admitting: Internal Medicine

## 2017-02-25 VITALS — BP 114/76 | HR 73 | Temp 97.6°F | Resp 14 | Ht 68.0 in | Wt 193.0 lb

## 2017-02-25 DIAGNOSIS — E042 Nontoxic multinodular goiter: Secondary | ICD-10-CM

## 2017-02-25 DIAGNOSIS — Z23 Encounter for immunization: Secondary | ICD-10-CM | POA: Diagnosis not present

## 2017-02-25 DIAGNOSIS — I10 Essential (primary) hypertension: Secondary | ICD-10-CM | POA: Diagnosis not present

## 2017-02-25 DIAGNOSIS — F411 Generalized anxiety disorder: Secondary | ICD-10-CM | POA: Diagnosis not present

## 2017-02-25 LAB — BASIC METABOLIC PANEL
BUN: 18 mg/dL (ref 6–23)
CALCIUM: 10.1 mg/dL (ref 8.4–10.5)
CO2: 28 mEq/L (ref 19–32)
CREATININE: 1.16 mg/dL (ref 0.40–1.20)
Chloride: 102 mEq/L (ref 96–112)
GFR: 60.21 mL/min (ref >60.00)
GLUCOSE: 84 mg/dL (ref 70–99)
POTASSIUM: 3.8 meq/L (ref 3.5–5.1)
Sodium: 139 mEq/L (ref 135–145)

## 2017-02-25 NOTE — Progress Notes (Signed)
Pre visit review using our clinic review tool, if applicable. No additional management support is needed unless otherwise documented below in the visit note. 

## 2017-02-25 NOTE — Patient Instructions (Signed)
GO TO THE LAB : Get the blood work     GO TO THE FRONT DESK Schedule your next appointment for a  Physical exam by 08/2017

## 2017-02-25 NOTE — Progress Notes (Signed)
Subjective:    Patient ID: Leah Cameron, female    DOB: 10-Dec-1950, 66 y.o.   MRN: [id number]  DOS:  02/25/2017 Type of visit - description : rov Interval history:  No major concerns, feeling well. She remains active. Medication list reviewed. Somewhat concerned about her thyroid and also gland on the left submandibular area: that there is monitored by her dentist, has not changed much over the years.  No pain. Labs reviewed, due for a BMP  Review of Systems   Past Medical History:  Diagnosis Date  . Allergic rhinitis   . Back pain, chronic   . Fibromyalgia    sees Dr.Davenshwar  . GERD (gastroesophageal reflux disease)   . Hypertension   . Menopause    on lexapro was started by gyn  . RLS (restless legs syndrome)   . Scoliosis   . Stricture esophagus    last EGD and dilatation 3/09  . Syncope 4/11   admitted thought to be due to over treatment of hypertension  . Thyromegaly   . Vertigo    went to ED on 10/30    Past Surgical History:  Procedure Laterality Date  . BACK SURGERY  09-05-13   cage and rods L4 L5 (@ [facility])  . BIOPSY THYROID  01-2009   negative   . BREAST LUMPECTOMY  2000  . COLONOSCOPY    . KNEE ARTHROSCOPY  2003   right  . KNEE SURGERY  1999/2002   left   . SHOULDER SURGERY  2004  . TONSILLECTOMY    . TRIGGER FINGER RELEASE  08-2014    R THUMB    Social History   Socioeconomic History  . Marital status: Married    Spouse name: Not on file  . Number of children: 2  . Years of education: Not on file  . Highest education level: Not on file  Social Needs  . Financial resource strain: Not on file  . Food insecurity - worry: Not on file  . Food insecurity - inability: Not on file  . Transportation needs - medical: Not on file  . Transportation needs - non-medical: Not on file  Occupational History  . Occupation:  Retired 2001, Public relations account executive, disable    Tobacco Use  . Smoking status: Never Smoker  . Smokeless tobacco: Never  Used  Substance and Sexual Activity  . Alcohol use: Yes    Comment: socially   . Drug use: No  . Sexual activity: Not on file  Other Topics Concern  . Not on file  Social History Narrative   Disable               Allergies as of 02/25/2017      Reactions   Cyclobenzaprine Hcl Other (See Comments)   Face swelling   Chlorzoxazone Rash, Swelling   Hydrocodone-acetaminophen Rash   Penicillins Swelling, Rash   Tolerated cefazolin   Tramadol Hcl Itching, Rash      Medication List        Accurate as of 02/25/17 11:59 PM. Always use your most recent med list.          amLODipine 5 MG tablet Commonly known as:  NORVASC Take 1 tablet (5 mg total) by mouth daily.   azelastine 0.1 % nasal spray Commonly known as:  ASTELIN Place 2 sprays into both nostrils at bedtime as needed for rhinitis. Use in each nostril as directed   benazepril 40 MG tablet Commonly known as:  LOTENSIN Take  1 tablet (40 mg total) by mouth daily.   CALTRATE 600+D 600-400 MG-UNIT chew tablet Generic drug:  Calcium Carbonate-Vitamin D Chew 1 tablet by mouth 2 (two) times daily.   escitalopram 10 MG tablet Commonly known as:  LEXAPRO Take 1 tablet (10 mg total) by mouth daily.   hydrocortisone 2.5 % cream Apply topically 2 (two) times daily.   lansoprazole 30 MG capsule Commonly known as:  PREVACID Take 1 capsule (30 mg total) by mouth 2 (two) times daily before a meal.   multivitamin with minerals Tabs tablet Take 1 tablet by mouth daily.   spironolactone 25 MG tablet Commonly known as:  ALDACTONE Take 1 tablet (25 mg total) by mouth daily.   VITAMIN C PO Take 1 tablet by mouth daily.   zolpidem 10 MG tablet Commonly known as:  AMBIEN Take 1 tablet (10 mg total) by mouth at bedtime as needed for sleep.          Objective:   Physical Exam  Neck:     BP 114/76 (BP Location: Left Arm, Patient Position: Sitting, Cuff Size: Normal)   Pulse 73   Temp 97.6 F (36.4 C) (Oral)    Resp 14   Ht 5\' 8"  (1.727 m)   Wt 193 lb (87.5 kg)   SpO2 98%   BMI 29.35 kg/m  General:   Well developed, well nourished . NAD.  HEENT:  Normocephalic . Face symmetric, atraumatic Neck: See graphic + Thyromegaly, slightly more noticeable on the left, gland is a smooth, nontender. Lungs:  CTA B Normal respiratory effort, no intercostal retractions, no accessory muscle use. Heart: RRR,  no murmur.  No pretibial edema bilaterally  Skin: Not pale. Not jaundice Neurologic:  alert & oriented X3.  Speech normal, gait appropriate for age and unassisted Psych--  Cognition and judgment appear intact.  Cooperative with normal attention span and concentration.  Behavior appropriate. No anxious or depressed appearing.      Assessment & Plan:    Assessment HTN --hypokalemia, ?primary hyperaldosteronism Anxiety, insomnia --- start to rx ambien here ~ 04-2015 (previously by rheumatology) Multinodular goiter, Dr. Dwyane Dee, seen 07-2014  ---01-2009 thyroid Bx (-), BX again 11-2014 GI: --GERD  --esophageal stricture, dilatation  2009 MSK: --fibromyalgia >>> stop seen Dr Herold Harms by the end of 2016 b/c she felt  better  --Chronic back pain -- resolved after 2015 back surgery --RLS -- Osteopenia: DEXA  11/28/13 Tscore -2.7; DEXA 09/09/16 T score -2.4 Syncope 2011 (? due to over rx of HTN) Self dc ASA 2016 d/t easy bruising   PLAN: HTN: Continue amlodipine, Lotensin, Aldactone.  Check a BMP.  Seems well controlled Anxiety, insomnia: Well-controlled on Lexapro and Ambien. Multinodular goiter: Physical exam seems stable, last [place] and BX 2016, plan to get a [place] again next year. Osteopenia: On calcium vitamin D Primary care: Had a flu shot, Prevnar today. RTC 08/2017 CPX

## 2017-02-27 NOTE — Assessment & Plan Note (Signed)
HTN: Continue amlodipine, Lotensin, Aldactone.  Check a BMP.  Seems well controlled Anxiety, insomnia: Well-controlled on Lexapro and Ambien. Multinodular goiter: Physical exam seems stable, last [place] and BX 2016, plan to get a [place] again next year. Osteopenia: On calcium vitamin D Primary care: Had a flu shot, Prevnar today. RTC 08/2017 CPX

## 2017-02-27 NOTE — Addendum Note (Signed)
Addended by: Kathlene November E on: 02/27/2017 09:35 AM   Modules accepted: Level of Service

## 2017-03-06 ENCOUNTER — Other Ambulatory Visit: Payer: Self-pay | Admitting: Internal Medicine

## 2017-04-22 ENCOUNTER — Telehealth: Payer: Self-pay

## 2017-04-22 NOTE — Telephone Encounter (Signed)
PA denied. Pt paid out of pocket last year for medication.

## 2017-04-22 NOTE — Telephone Encounter (Signed)
PA initiated via Covermymeds; KEY: [id number]. Awaiting determination.

## 2017-05-04 ENCOUNTER — Other Ambulatory Visit: Payer: Self-pay | Admitting: Internal Medicine

## 2017-05-05 ENCOUNTER — Other Ambulatory Visit: Payer: Self-pay | Admitting: Internal Medicine

## 2017-06-10 ENCOUNTER — Telehealth: Payer: Self-pay | Admitting: Internal Medicine

## 2017-06-10 NOTE — Telephone Encounter (Signed)
Pt is requesting refill on Ambien 10mg .   Last OV: 02/25/2017  Last Fill: 11/18/2016 #30 and 5RF UDS: Per PCP not needed for Ambien only  NCCR printed- no issues noted  Please advise.

## 2017-06-10 NOTE — Telephone Encounter (Signed)
Prescription sent.  At the next opportunity will discuss reduction of dose  to 5 mg due to age

## 2017-06-14 NOTE — Telephone Encounter (Signed)
[place] [facility] called and asked for Dr. Larose Kells to call the prior authorization dept b/c they need some information that only they can get from the provider to disclose, contact [place] [employer] prior auth @ [id number]

## 2017-06-15 NOTE — Telephone Encounter (Signed)
Ok thx.

## 2017-06-15 NOTE — Telephone Encounter (Signed)
PA actually already denied for 2019- Pt already pays OOP for this med.

## 2017-06-29 ENCOUNTER — Other Ambulatory Visit: Payer: Self-pay | Admitting: Internal Medicine

## 2017-08-24 ENCOUNTER — Ambulatory Visit (HOSPITAL_BASED_OUTPATIENT_CLINIC_OR_DEPARTMENT_OTHER)
Admission: RE | Admit: 2017-08-24 | Discharge: 2017-08-24 | Disposition: A | Discharge status: Home / Self Care | Payer: Medicare Other | Source: Ambulatory Visit | Attending: Internal Medicine | Admitting: Internal Medicine

## 2017-08-24 ENCOUNTER — Other Ambulatory Visit: Payer: Self-pay | Admitting: Internal Medicine

## 2017-08-24 DIAGNOSIS — Z1231 Encounter for screening mammogram for malignant neoplasm of breast: Secondary | ICD-10-CM | POA: Diagnosis present

## 2017-08-25 ENCOUNTER — Other Ambulatory Visit: Payer: Self-pay | Admitting: Internal Medicine

## 2017-08-25 DIAGNOSIS — R928 Other abnormal and inconclusive findings on diagnostic imaging of breast: Secondary | ICD-10-CM

## 2017-08-26 ENCOUNTER — Telehealth: Payer: Self-pay | Admitting: *Deleted

## 2017-08-26 NOTE — Telephone Encounter (Signed)
Received Physician Orders from [facility]; forwarded to provider/SLS 05/17

## 2017-08-28 ENCOUNTER — Other Ambulatory Visit: Payer: Self-pay | Admitting: Internal Medicine

## 2017-08-29 ENCOUNTER — Other Ambulatory Visit: Payer: Self-pay | Admitting: Internal Medicine

## 2017-08-30 ENCOUNTER — Other Ambulatory Visit: Payer: Self-pay | Admitting: Internal Medicine

## 2017-08-30 ENCOUNTER — Ambulatory Visit
Admission: RE | Admit: 2017-08-30 | Discharge: 2017-08-30 | Disposition: A | Discharge status: Home / Self Care | Payer: Medicare Other | Source: Ambulatory Visit | Attending: Internal Medicine | Admitting: Internal Medicine

## 2017-08-30 DIAGNOSIS — N6320 Unspecified lump in the left breast, unspecified quadrant: Secondary | ICD-10-CM

## 2017-08-30 DIAGNOSIS — R928 Other abnormal and inconclusive findings on diagnostic imaging of breast: Secondary | ICD-10-CM

## 2017-09-01 ENCOUNTER — Ambulatory Visit
Admission: RE | Admit: 2017-09-01 | Discharge: 2017-09-01 | Disposition: A | Discharge status: Home / Self Care | Payer: Medicare Other | Source: Ambulatory Visit | Attending: Internal Medicine | Admitting: Internal Medicine

## 2017-09-01 DIAGNOSIS — N6320 Unspecified lump in the left breast, unspecified quadrant: Secondary | ICD-10-CM

## 2017-09-03 ENCOUNTER — Other Ambulatory Visit: Payer: Self-pay | Admitting: Internal Medicine

## 2017-09-06 HISTORY — PX: BREAST BIOPSY: SHX20

## 2017-09-08 ENCOUNTER — Telehealth: Payer: Self-pay | Admitting: *Deleted

## 2017-09-08 NOTE — Telephone Encounter (Signed)
Received Dermatopathology Report results from [facility]; forwarded to provider/SLS 05/30

## 2017-10-27 ENCOUNTER — Other Ambulatory Visit: Payer: Self-pay | Admitting: Internal Medicine

## 2017-11-06 ENCOUNTER — Other Ambulatory Visit: Payer: Self-pay | Admitting: Internal Medicine

## 2017-11-08 ENCOUNTER — Other Ambulatory Visit: Payer: Self-pay | Admitting: Internal Medicine

## 2017-11-16 ENCOUNTER — Telehealth: Payer: Self-pay

## 2017-11-16 ENCOUNTER — Ambulatory Visit: Payer: Medicare Other | Admitting: Internal Medicine

## 2017-11-16 ENCOUNTER — Encounter: Payer: Self-pay | Admitting: Internal Medicine

## 2017-11-16 VITALS — BP 126/76 | HR 73 | Temp 97.8°F | Resp 16 | Ht 68.0 in | Wt 193.5 lb

## 2017-11-16 DIAGNOSIS — L918 Other hypertrophic disorders of the skin: Secondary | ICD-10-CM

## 2017-11-16 DIAGNOSIS — K219 Gastro-esophageal reflux disease without esophagitis: Secondary | ICD-10-CM

## 2017-11-16 DIAGNOSIS — E042 Nontoxic multinodular goiter: Secondary | ICD-10-CM

## 2017-11-16 DIAGNOSIS — F411 Generalized anxiety disorder: Secondary | ICD-10-CM

## 2017-11-16 DIAGNOSIS — Z23 Encounter for immunization: Secondary | ICD-10-CM

## 2017-11-16 DIAGNOSIS — I10 Essential (primary) hypertension: Secondary | ICD-10-CM | POA: Diagnosis not present

## 2017-11-16 LAB — COMPREHENSIVE METABOLIC PANEL
ALT: 21 U/L (ref 0–35)
AST: 17 U/L (ref 0–37)
Albumin: 4.5 g/dL (ref 3.5–5.2)
Alkaline Phosphatase: 90 U/L (ref 39–117)
BILIRUBIN TOTAL: 0.5 mg/dL (ref 0.2–1.2)
BUN: 18 mg/dL (ref 6–23)
CO2: 32 meq/L (ref 19–32)
Calcium: 10.2 mg/dL (ref 8.4–10.5)
Chloride: 102 mEq/L (ref 96–112)
Creatinine, Ser: 1.01 mg/dL (ref 0.40–1.20)
GFR: 70.48 mL/min (ref >60.00)
GLUCOSE: 94 mg/dL (ref 70–99)
POTASSIUM: 4.7 meq/L (ref 3.5–5.1)
SODIUM: 139 meq/L (ref 135–145)
TOTAL PROTEIN: 7.1 g/dL (ref 6.0–8.3)

## 2017-11-16 LAB — LIPID PANEL
CHOL/HDL RATIO: 4
Cholesterol: 166 mg/dL (ref 0–200)
HDL: 45.1 mg/dL (ref >39.00)
LDL Cholesterol: 96 mg/dL (ref 0–99)
NONHDL: 121.27
Triglycerides: 124 mg/dL (ref 0.0–149.0)
VLDL: 24.8 mg/dL (ref 0.0–40.0)

## 2017-11-16 LAB — TSH: TSH: 0.45 u[IU]/mL (ref 0.35–4.50)

## 2017-11-16 MED ORDER — AMLODIPINE BESYLATE 5 MG PO TABS: 5.0000 mg | ORAL_TABLET | Freq: Every day | ORAL | 1 refills | Status: DC

## 2017-11-16 MED ORDER — LANSOPRAZOLE 30 MG PO CPDR: 30.0000 mg | DELAYED_RELEASE_CAPSULE | Freq: Two times a day (BID) | ORAL | 3 refills | Status: DC

## 2017-11-16 MED ORDER — ZOLPIDEM TARTRATE 5 MG PO TABS: 5.0000 mg | ORAL_TABLET | Freq: Every evening | ORAL | 0 refills | Status: DC | PRN

## 2017-11-16 MED ORDER — ESCITALOPRAM OXALATE 10 MG PO TABS: 10.0000 mg | ORAL_TABLET | Freq: Every day | ORAL | 1 refills | Status: DC

## 2017-11-16 MED ORDER — SPIRONOLACTONE 25 MG PO TABS: 25.0000 mg | ORAL_TABLET | Freq: Every day | ORAL | 1 refills | Status: DC

## 2017-11-16 MED ORDER — BENAZEPRIL HCL 40 MG PO TABS: 40.0000 mg | ORAL_TABLET | Freq: Every day | ORAL | 1 refills | Status: DC

## 2017-11-16 NOTE — Telephone Encounter (Signed)
PA approved through 04/11/2018. 

## 2017-11-16 NOTE — Progress Notes (Signed)
Subjective:    Patient ID: Leah Cameron, female    DOB: [birth year], 67 y.o.   MRN: [id number]  DOS:  11/16/2017 Type of visit - description : Routine visit Interval history: Anxiety: controlled  Insomnia: doing well, has been trying to cut ambien in half but pills are very small HTN: Good compliant with medication. She has a number of the skin tags, also a number for skin lesions.  Dermatology?Marland Kitchen GERD: Symptoms control as long as she takes PPIs   Review of Systems Denies chest pain or difficulty breathing No nausea, vomiting, diarrhea  Past Medical History:  Diagnosis Date  . Allergic rhinitis   . Back pain, chronic   . Fibromyalgia    sees Dr.Davenshwar  . GERD (gastroesophageal reflux disease)   . Hypertension   . Menopause    on lexapro was started by gyn  . RLS (restless legs syndrome)   . Scoliosis   . Stricture esophagus    last EGD and dilatation 3/09  . Syncope 4/11   admitted thought to be due to over treatment of hypertension  . Thyromegaly   . Vertigo    went to ED on 10/30    Past Surgical History:  Procedure Laterality Date  . BACK SURGERY  09-05-13   cage and rods L4 L5 (@ [facility])  . BIOPSY THYROID  01-2009   negative   . BREAST LUMPECTOMY  2000  . COLONOSCOPY    . KNEE ARTHROSCOPY  2003   right  . KNEE SURGERY  1999/2002   left   . SHOULDER SURGERY  2004  . TONSILLECTOMY    . TRIGGER FINGER RELEASE  08-2014    R THUMB    Social History   Socioeconomic History  . Marital status: Married    Spouse name: Not on file  . Number of children: 2  . Years of education: Not on file  . Highest education level: Not on file  Occupational History  . Occupation:  Retired 2001, Public relations account executive, disable    Social Needs  . Financial resource strain: Not on file  . Food insecurity:    Worry: Not on file    Inability: Not on file  . Transportation needs:    Medical: Not on file    Non-medical: Not on file  Tobacco Use  . Smoking status:  Never Smoker  . Smokeless tobacco: Never Used  Substance and Sexual Activity  . Alcohol use: Yes    Comment: socially   . Drug use: No  . Sexual activity: Not on file  Lifestyle  . Physical activity:    Days per week: Not on file    Minutes per session: Not on file  . Stress: Not on file  Relationships  . Social connections:    Talks on phone: Not on file    Gets together: Not on file    Attends religious service: Not on file    Active member of club or organization: Not on file    Attends meetings of clubs or organizations: Not on file    Relationship status: Not on file  . Intimate partner violence:    Fear of current or ex partner: Not on file    Emotionally abused: Not on file    Physically abused: Not on file    Forced sexual activity: Not on file  Other Topics Concern  . Not on file  Social History Narrative   Disable  Allergies as of 11/16/2017      Reactions   Cyclobenzaprine Hcl Other (See Comments)   Face swelling   Chlorzoxazone Rash, Swelling   Hydrocodone-acetaminophen Rash   Penicillins Swelling, Rash   Tolerated cefazolin   Tramadol Hcl Itching, Rash      Medication List        Accurate as of 11/16/17  4:02 PM. Always use your most recent med list.          amLODipine 5 MG tablet Commonly known as:  NORVASC Take 1 tablet (5 mg total) by mouth daily.   azelastine 0.1 % nasal spray Commonly known as:  ASTELIN Place 2 sprays into both nostrils at bedtime as needed for rhinitis. Use in each nostril as directed   benazepril 40 MG tablet Commonly known as:  LOTENSIN Take 1 tablet (40 mg total) by mouth daily.   CALTRATE 600+D 600-400 MG-UNIT chew tablet Generic drug:  Calcium Carbonate-Vitamin D Chew 1 tablet by mouth 2 (two) times daily.   escitalopram 10 MG tablet Commonly known as:  LEXAPRO Take 1 tablet (10 mg total) by mouth daily.   hydrocortisone 2.5 % cream Apply topically 2 (two) times daily.   lansoprazole 30 MG  capsule Commonly known as:  PREVACID Take 1 capsule (30 mg total) by mouth 2 (two) times daily before a meal.   multivitamin with minerals Tabs tablet Take 1 tablet by mouth daily.   spironolactone 25 MG tablet Commonly known as:  ALDACTONE Take 1 tablet (25 mg total) by mouth daily.   VITAMIN C PO Take 1 tablet by mouth daily.   zolpidem 5 MG tablet Commonly known as:  AMBIEN Take 1 tablet (5 mg total) by mouth at bedtime as needed for sleep.          Objective:   Physical Exam BP 126/76 (BP Location: Left Arm, Patient Position: Sitting, Cuff Size: Small)   Pulse 73   Temp 97.8 F (36.6 C) (Oral)   Resp 16   Ht 5\' 8"  (1.727 m)   Wt 193 lb 8 oz (87.8 kg)   SpO2 98%   BMI 29.42 kg/m  General:   Well developed, NAD, see BMI.  HEENT:  Normocephalic . Face symmetric, atraumatic Neck: Thyroid gland a large, more so on the left, not nodular or tender.  No unusual lymphnodes. Lungs:  CTA B Normal respiratory effort, no intercostal retractions, no accessory muscle use. Heart: RRR,  no murmur.  No pretibial edema bilaterally  Skin:  At the right calf has a 3 x 2 mm slightly raised hypochromic area. Multiple skin tags in the neck Multiple hypochromic small lesions in the extremities Neurologic:  alert & oriented X3.  Speech normal, gait appropriate for age and unassisted Psych--  Cognition and judgment appear intact.  Cooperative with normal attention span and concentration.  Behavior appropriate. No anxious or depressed appearing.      Assessment & Plan:    Assessment HTN --hypokalemia, ?primary hyperaldosteronism Anxiety, insomnia --- start to rx ambien here ~ 04-2015 (previously by rheumatology) Multinodular goiter, Dr. Dwyane Dee, seen 07-2014  ---01-2009 thyroid Bx (-), BX again 11-2014 GI: --GERD  --esophageal stricture, dilatation  2009 MSK: --fibromyalgia >>> stop seen Dr Herold Harms by the end of 2016 b/c she felt  better  --Chronic back pain -- resolved  after 2015 back surgery --RLS -- Osteopenia: DEXA  11/28/13 Tscore -2.7; DEXA 09/09/16 T score -2.4 Syncope 2011 (? due to over rx of HTN) Self dc ASA 2016  d/t easy bruising   PLAN: HTN: Seems controlled, RF amlodipine, Lotensin, Aldactone.  Check a CMP and FLP Anxiety, insomnia: RF Lexapro, decrease Ambien to 5 mg (she has been trying to cut down the tablet; also decrease dose is indicated due to age) Multinodular goiter: Seems a stable clinically, check a TSH and ultrasound GERD: Controlled on PPIs, RF. Skin tags, skin lesions: See physical exam, refer to dermatology Preventive care: Td today RTC CPX 6 months

## 2017-11-16 NOTE — Telephone Encounter (Signed)
PA initiated via Covermymeds; KEY: [id number]. Awaiting determination.

## 2017-11-16 NOTE — Assessment & Plan Note (Signed)
HTN: Seems controlled, RF amlodipine, Lotensin, Aldactone.  Check a CMP and FLP Anxiety, insomnia: RF Lexapro, decrease Ambien to 5 mg (she has been trying to cut down the tablet; also decrease dose is indicated due to age) Multinodular goiter: Seems a stable clinically, check a TSH and ultrasound GERD: Controlled on PPIs, RF. Skin tags, skin lesions: See physical exam, refer to dermatology Preventive care: Td today RTC CPX 6 months

## 2017-11-16 NOTE — Patient Instructions (Signed)
GO TO THE LAB : Get the blood work     GO TO THE FRONT DESK Schedule your next appointment for a  physical exam in 6 months  

## 2017-11-16 NOTE — Progress Notes (Signed)
Pre visit review using our clinic review tool, if applicable. No additional management support is needed unless otherwise documented below in the visit note. 

## 2017-11-30 ENCOUNTER — Ambulatory Visit (HOSPITAL_BASED_OUTPATIENT_CLINIC_OR_DEPARTMENT_OTHER)
Admission: RE | Admit: 2017-11-30 | Discharge: 2017-11-30 | Disposition: A | Discharge status: Home / Self Care | Payer: Medicare Other | Source: Ambulatory Visit | Attending: Internal Medicine | Admitting: Internal Medicine

## 2017-11-30 DIAGNOSIS — E042 Nontoxic multinodular goiter: Secondary | ICD-10-CM | POA: Diagnosis present

## 2017-12-15 ENCOUNTER — Ambulatory Visit: Payer: Medicare Other | Admitting: Family Medicine

## 2017-12-15 ENCOUNTER — Encounter: Payer: Self-pay | Admitting: Family Medicine

## 2017-12-15 VITALS — BP 124/72 | HR 77 | Temp 98.3°F | Resp 16 | Ht 68.0 in | Wt 198.0 lb

## 2017-12-15 DIAGNOSIS — J209 Acute bronchitis, unspecified: Secondary | ICD-10-CM | POA: Diagnosis not present

## 2017-12-15 MED ORDER — DOXYCYCLINE HYCLATE 100 MG PO CAPS: 100.0000 mg | ORAL_CAPSULE | Freq: Two times a day (BID) | ORAL | 0 refills | Status: DC

## 2017-12-15 MED ORDER — PREDNISONE 20 MG PO TABS: ORAL_TABLET | ORAL | 0 refills | Status: DC

## 2017-12-15 NOTE — Patient Instructions (Signed)
It was good to see you today We gave you an rx for doxycycline and prednisone today- you can hold onto these and use if you are not getting better or if any worsening If any concerns let us know  Delsym is a good cough syrup that lasts all day

## 2017-12-15 NOTE — Progress Notes (Signed)
[facility] [place], [place], [place] [address] [phone]  Date:  12/15/2017   Name:  Leah Cameron   DOB:  13-Sep-1950   MRN:  [id number]  PCP:  Colon Branch, MD    Chief Complaint: URI (productive coughing brown sputum, congestion, feeling "clammy and hot", wheezing)   History of Present Illness:  Leah Cameron is a 67 y.o. very pleasant female patient who presents with the following:  Pt of Dr. Larose Kells here today with illness History of HTN, GERD She first noted sinus drainage/ allergies about a week ago, then developed chest congestion and cough for the last 4 days  Her sx are getting worse instead of better No fever She is coughing up some discolored mucus No aches or chills Mild wheezing  No vomiting or diarrhea She will cough until she urinates a bit but no other GI or GU sx  She has tried some robitussin cough meds otc She does not have any history of asthma or diabetes   Patient Active Problem List   Diagnosis Date Noted  . Osteopenia 12/22/2016  . PCP NOTES >>>>>>>>>>>>>>>>>>>>>>>>>>>>>>>>>>>> 03/24/2015  . Acquired spondylolisthesis 08/14/2013  . Annual physical exam 10/26/2011  . Palpitations 08/12/2010  . MENOPAUSAL SYNDROME 01/01/2009  . Anxiety state, insomnia 01/01/2009  . Multinodular goiter 12/21/2006  . RESTLESS LEG SYNDROME 12/21/2006  . Essential hypertension--hypokalemia, question of primary hyperaldosteronism 12/21/2006  . ALLERGIC RHINITIS 12/21/2006  . STRICTURE, ESOPHAGEAL 12/21/2006  . GERD 12/21/2006  . BACK PAIN, CHRONIC 12/21/2006  . Fibromyalgia 12/21/2006    Past Medical History:  Diagnosis Date  . Allergic rhinitis   . Back pain, chronic   . Fibromyalgia    sees Dr.Davenshwar  . GERD (gastroesophageal reflux disease)   . Hypertension   . Menopause    on lexapro was started by gyn  . RLS (restless legs syndrome)   . Scoliosis   . Stricture esophagus     last EGD and dilatation 3/09  . Syncope 4/11   admitted thought to be due to over treatment of hypertension  . Thyromegaly   . Vertigo    went to ED on 10/30    Past Surgical History:  Procedure Laterality Date  . BACK SURGERY  09-05-13   cage and rods L4 L5 (@ [facility])  . BIOPSY THYROID  01-2009   negative   . BREAST LUMPECTOMY  2000  . COLONOSCOPY    . KNEE ARTHROSCOPY  2003   right  . KNEE SURGERY  1999/2002   left   . SHOULDER SURGERY  2004  . TONSILLECTOMY    . TRIGGER FINGER RELEASE  08-2014    R THUMB    Social History   Tobacco Use  . Smoking status: Never Smoker  . Smokeless tobacco: Never Used  Substance Use Topics  . Alcohol use: Yes    Comment: socially   . Drug use: No    Family History  Problem Relation Age of Onset  . Stroke Mother        M, F, 1/2 sister, sister   . Thyroid disease Mother   . Breast cancer Other        1/2 sister  . Diabetes Other        aunt  . Coronary artery disease Other        GF late in life  . Lung cancer Other  cousin  . Stroke Sister   . Dementia Sister   . Colon cancer Neg Hx     Allergies  Allergen Reactions  . Cyclobenzaprine Hcl Other (See Comments)    Face swelling  . Chlorzoxazone Rash and Swelling  . Hydrocodone-Acetaminophen Rash  . Penicillins Swelling and Rash    Tolerated cefazolin  . Tramadol Hcl Itching and Rash    Medication list has been reviewed and updated.  Current Outpatient Medications on File Prior to Visit  Medication Sig Dispense Refill  . amLODipine (NORVASC) 5 MG tablet Take 1 tablet (5 mg total) by mouth daily. 90 tablet 1  . Ascorbic Acid (VITAMIN C PO) Take 1 tablet by mouth daily.    Marland Kitchen azelastine (ASTELIN) 0.1 % nasal spray Place 2 sprays into both nostrils at bedtime as needed for rhinitis. Use in each nostril as directed 30 mL 3  . benazepril (LOTENSIN) 40 MG tablet Take 1 tablet (40 mg total) by mouth daily. 90 tablet 1  . Calcium Carbonate-Vitamin D (CALTRATE 600+D)  600-400 MG-UNIT per chew tablet Chew 1 tablet by mouth 2 (two) times daily.     Marland Kitchen escitalopram (LEXAPRO) 10 MG tablet Take 1 tablet (10 mg total) by mouth daily. 90 tablet 1  . hydrocortisone 2.5 % cream Apply topically 2 (two) times daily. 30 g 0  . lansoprazole (PREVACID) 30 MG capsule Take 1 capsule (30 mg total) by mouth 2 (two) times daily before a meal. 180 capsule 3  . Multiple Vitamin (MULTIVITAMIN WITH MINERALS) TABS Take 1 tablet by mouth daily.    Marland Kitchen spironolactone (ALDACTONE) 25 MG tablet Take 1 tablet (25 mg total) by mouth daily. 90 tablet 1  . zolpidem (AMBIEN) 5 MG tablet Take 1 tablet (5 mg total) by mouth at bedtime as needed for sleep. 90 tablet 0   No current facility-administered medications on file prior to visit.     Review of Systems:  As per HPI- otherwise negative. No fever  No rash No ST  No sneezing    Physical Examination: Vitals:   12/15/17 1611  BP: 124/72  Pulse: 77  Resp: 16  Temp: 98.3 F (36.8 C)  SpO2: 98%   Vitals:   12/15/17 1611  Weight: 198 lb (89.8 kg)  Height: 5\' 8"  (1.727 m)   Body mass index is 30.11 kg/m. Ideal Body Weight: Weight in (lb) to have BMI = 25: 164.1  GEN: WDWN, NAD, Non-toxic, A & O x 3, obese, looks well  Occasional cough in room today HEENT: Atraumatic, Normocephalic. Neck supple. No masses, No LAD.  Bilateral TM wnl, oropharynx normal.  PEERL,EOMI.   Ears and Nose: No external deformity.   CV: RRR, No M/G/R. No JVD. No thrill. No extra heart sounds. PULM: CTA B, no wheezes, crackles, rhonchi. No retractions. No resp. distress. No accessory muscle use. EXTR: No c/c/e NEURO Normal gait.  PSYCH: Normally interactive. Conversant. Not depressed or anxious appearing.  Calm demeanor.    Assessment and Plan: Acute bronchitis, unspecified organism - Plan: doxycycline (VIBRAMYCIN) 100 MG capsule, predniSONE (DELTASONE) 20 MG tablet  Bronchitis - pt would like to see if she may get better on her own Gave printed rx  for doxy and prednisone which she will fill and use if needed She will let me know if not doing ok or if any other concerns   Signed Lamar Blinks, MD

## 2017-12-26 ENCOUNTER — Other Ambulatory Visit: Payer: Self-pay | Admitting: Internal Medicine

## 2018-02-21 ENCOUNTER — Telehealth: Payer: Self-pay | Admitting: Internal Medicine

## 2018-02-21 NOTE — Telephone Encounter (Signed)
Pt is requesting refill on Ambien 5mg .   Last OV: 11/16/2017 Last Fill: 11/16/2017 #90 and 0RF UDS: Not needed for Ambien per PCP  NCCR printed- no discrepancies noted- sent for scanning

## 2018-02-21 NOTE — Telephone Encounter (Signed)
sent 

## 2018-03-06 ENCOUNTER — Ambulatory Visit: Payer: Medicare Other | Admitting: Internal Medicine

## 2018-03-06 ENCOUNTER — Encounter: Payer: Self-pay | Admitting: Internal Medicine

## 2018-03-06 VITALS — BP 128/82 | HR 91 | Temp 98.5°F | Resp 16 | Ht 68.0 in | Wt 199.1 lb

## 2018-03-06 DIAGNOSIS — J029 Acute pharyngitis, unspecified: Secondary | ICD-10-CM

## 2018-03-06 DIAGNOSIS — J069 Acute upper respiratory infection, unspecified: Secondary | ICD-10-CM

## 2018-03-06 LAB — POCT RAPID STREP A (OFFICE): RAPID STREP A SCREEN: NEGATIVE

## 2018-03-06 MED ORDER — AZELASTINE HCL 0.1 % NA SOLN: 2.0000 | Freq: Every evening | NASAL | 3 refills | Status: DC | PRN

## 2018-03-06 NOTE — Progress Notes (Signed)
Subjective:    Patient ID: Leah Cameron, female    DOB: [birth year], 67 y.o.   MRN: [id number]  DOS:  03/06/2018 Type of visit - description : acute Symptoms of started 5 days ago with ears feeling stopped up, sore throat. She is using seeing Coricidin OTC and gargling. All her grandchildren have been sick with viruses on and off recently.   Review of Systems No fever, some chills Mild cough + Sinus congestion but no postnasal dripping. No unusual aches or pains + Malaise  Past Medical History:  Diagnosis Date  . Allergic rhinitis   . Back pain, chronic   . Fibromyalgia    sees Dr.Davenshwar  . GERD (gastroesophageal reflux disease)   . HPV (human papilloma virus) infection   . Hypertension   . Menopause    on lexapro was started by gyn  . RLS (restless legs syndrome)   . Scoliosis   . Stricture esophagus    last EGD and dilatation 3/09  . Syncope 4/11   admitted thought to be due to over treatment of hypertension  . Thyromegaly   . Vertigo    went to ED on 10/30    Past Surgical History:  Procedure Laterality Date  . BACK SURGERY  09-05-13   cage and rods L4 L5 (@ [facility])  . BIOPSY THYROID  01-2009   negative   . BREAST LUMPECTOMY  2000  . COLONOSCOPY    . KNEE ARTHROSCOPY  2003   right  . KNEE SURGERY  1999/2002   left   . SHOULDER SURGERY  2004  . TONSILLECTOMY    . TRIGGER FINGER RELEASE  08-2014    R THUMB    Social History   Socioeconomic History  . Marital status: Married    Spouse name: Not on file  . Number of children: 2  . Years of education: Not on file  . Highest education level: Not on file  Occupational History  . Occupation:  Retired 2001, Public relations account executive, disable    Social Needs  . Financial resource strain: Not on file  . Food insecurity:    Worry: Not on file    Inability: Not on file  . Transportation needs:    Medical: Not on file    Non-medical: Not on file  Tobacco Use  . Smoking status: Never Smoker  .  Smokeless tobacco: Never Used  Substance and Sexual Activity  . Alcohol use: Yes    Comment: socially   . Drug use: No  . Sexual activity: Not on file  Lifestyle  . Physical activity:    Days per week: Not on file    Minutes per session: Not on file  . Stress: Not on file  Relationships  . Social connections:    Talks on phone: Not on file    Gets together: Not on file    Attends religious service: Not on file    Active member of club or organization: Not on file    Attends meetings of clubs or organizations: Not on file    Relationship status: Not on file  . Intimate partner violence:    Fear of current or ex partner: Not on file    Emotionally abused: Not on file    Physically abused: Not on file    Forced sexual activity: Not on file  Other Topics Concern  . Not on file  Social History Narrative   Disable  Allergies as of 03/06/2018      Reactions   Cyclobenzaprine Hcl Other (See Comments)   Face swelling   Chlorzoxazone Rash, Swelling   Hydrocodone-acetaminophen Rash   Penicillins Swelling, Rash   Tolerated cefazolin   Tramadol Hcl Itching, Rash      Medication List        Accurate as of 03/06/18 11:59 PM. Always use your most recent med list.          amLODipine 5 MG tablet Commonly known as:  NORVASC Take 1 tablet (5 mg total) by mouth daily.   azelastine 0.1 % nasal spray Commonly known as:  ASTELIN Place 2 sprays into both nostrils at bedtime as needed for rhinitis. Use in each nostril as directed   benazepril 40 MG tablet Commonly known as:  LOTENSIN Take 1 tablet (40 mg total) by mouth daily.   CALTRATE 600+D 600-400 MG-UNIT chew tablet Generic drug:  Calcium Carbonate-Vitamin D Chew 1 tablet by mouth 2 (two) times daily.   escitalopram 10 MG tablet Commonly known as:  LEXAPRO Take 1 tablet (10 mg total) by mouth daily.   lansoprazole 30 MG capsule Commonly known as:  PREVACID Take 1 capsule (30 mg total) by mouth 2  (two) times daily before a meal.   multivitamin with minerals Tabs tablet Take 1 tablet by mouth daily.   spironolactone 25 MG tablet Commonly known as:  ALDACTONE Take 1 tablet (25 mg total) by mouth daily.   VITAMIN C PO Take 1 tablet by mouth daily.   zolpidem 5 MG tablet Commonly known as:  AMBIEN TAKE 1 TABLET (5 MG TOTAL) BY MOUTH AT BEDTIME AS NEEDED FOR SLEEP.           Objective:   Physical Exam BP 128/82 (BP Location: Left Arm, Patient Position: Sitting, Cuff Size: Small)   Pulse 91   Temp 98.5 F (36.9 C) (Oral)   Resp 16   Ht 5\' 8"  (1.727 m)   Wt 199 lb 2 oz (90.3 kg)   SpO2 98%   BMI 30.28 kg/m  General:   Well developed, NAD, BMI noted. HEENT:  Normocephalic . Face symmetric, atraumatic.  TMs obscured by wax, partially seen, normal.  Throat: Slightly red, symmetric, no discharge.  Nose congested. Lungs:  CTA B Normal respiratory effort, no intercostal retractions, no accessory muscle use. Heart: RRR,  no murmur.  No pretibial edema bilaterally  Skin: Not pale. Not jaundice Neurologic:  alert & oriented X3.  Speech normal, gait appropriate for age and unassisted Psych--  Cognition and judgment appear intact.  Cooperative with normal attention span and concentration.  Behavior appropriate. No anxious or depressed appearing.      Assessment & Plan:   Assessment HTN --hypokalemia, ?primary hyperaldosteronism Anxiety, insomnia --- start to rx ambien here ~ 04-2015 (previously by rheumatology) Multinodular goiter, Dr. Dwyane Dee, seen 07-2014  ---01-2009 thyroid Bx (-), BX again 11-2014 GI: --GERD  --esophageal stricture, dilatation  2009 MSK: --fibromyalgia >>> stop seen Dr Herold Harms by the end of 2016 b/c she felt  better  --Chronic back pain -- resolved after 2015 back surgery --RLS -- Osteopenia: DEXA  11/28/13 Tscore -2.7; DEXA 09/09/16 T score -2.4 Syncope 2011 (? due to over rx of HTN) Self dc ASA 2016 d/t easy bruising   PLAN: URI: Sx  C/W URI, strep test negative. Recommend supportive treatment, see AVS, call if no better in several days.

## 2018-03-06 NOTE — Patient Instructions (Addendum)
Please schedule Medicare Wellness visit with Glenard Haring once you are better.   Rest, fluids , tylenol  For cough:  Take Mucinex DM twice a day as needed until better  For nasal congestion: Use OTC  Flonase : 2 nasal sprays on each side of the nose in the morning until you feel better Use ASTELIN a prescribed spray : 2 nasal sprays on each side of the nose at night until you feel better   Avoid decongestants such as  Pseudoephedrine or phenylephrine     Call if not gradually better over the next  10 days  Call anytime if the symptoms are severe

## 2018-03-06 NOTE — Progress Notes (Signed)
Pre visit review using our clinic review tool, if applicable. No additional management support is needed unless otherwise documented below in the visit note. 

## 2018-03-07 NOTE — Assessment & Plan Note (Signed)
URI: Sx C/W URI, strep test negative. Recommend supportive treatment, see AVS, call if no better in several days.

## 2018-03-23 ENCOUNTER — Encounter: Payer: Self-pay | Admitting: Internal Medicine

## 2018-03-23 NOTE — Telephone Encounter (Signed)
Pt is requesting a refill on Ambien.   Last OV: 03/06/2018 Last Fill: 02/21/2018 #90 and 0RF (was sent to local, requesting it to be sent to mail order UDS: None

## 2018-03-30 ENCOUNTER — Ambulatory Visit: Payer: Medicare Other | Admitting: Family Medicine

## 2018-03-30 ENCOUNTER — Ambulatory Visit: Payer: Self-pay | Admitting: *Deleted

## 2018-03-30 ENCOUNTER — Encounter: Payer: Self-pay | Admitting: Family Medicine

## 2018-03-30 VITALS — BP 124/84 | HR 82 | Temp 98.1°F | Ht 68.0 in | Wt 201.0 lb

## 2018-03-30 DIAGNOSIS — J0100 Acute maxillary sinusitis, unspecified: Secondary | ICD-10-CM | POA: Diagnosis not present

## 2018-03-30 MED ORDER — FLUTICASONE PROPIONATE 50 MCG/ACT NA SUSP: 2.0000 | Freq: Every day | NASAL | 6 refills | Status: AC

## 2018-03-30 MED ORDER — AZITHROMYCIN 250 MG PO TABS: ORAL_TABLET | ORAL | 0 refills | Status: DC

## 2018-03-30 NOTE — Patient Instructions (Signed)

## 2018-03-30 NOTE — Telephone Encounter (Signed)
Contacted pt regarding symptoms; she was seen 03/06/18 by Dr Larose Kells; the pt says that she is having nasal congestion; she does have green secretions; the pt says that she did have some sinus pressure; she says that it" feels like a head cold"; she has taken corcidon and advil cold and sinus but has gotten little relief; the pt also has used a humifier; recommendations made per nurse triage protocol; she would like to be seen in the office today; Dr Larose Kells has no availability; spoke with [facility] regarding scheduling pt; pt offered appointment with Dr Lorelei Pont at 1100 but is unable to accept it; pt agreeable to being seen by another provider at another practice; pt offered and accepted appointment with Dr Kris Mouton, [facility], 03/30/18 at 1340; she verbalized understanding; will route to office for notification of this upcoming appointment.  Reason for Disposition . [1] Nasal discharge AND [2] present > 10 days  Answer Assessment - Initial Assessment Questions 1. LOCATION: "Where does it hurt?"      Around sides of nose 2. ONSET: "When did the sinus pain start?"  (e.g., hours, days)      Seen in 03/06/18 3. SEVERITY: "How bad is the pain?"   (Scale 1-10; mild, moderate or severe)   - MILD (1-3): doesn't interfere with normal activities    - MODERATE (4-7): interferes with normal activities (e.g., work or school) or awakens from sleep   - SEVERE (8-10): excruciating pain and patient unable to do any normal activities        mild 4. RECURRENT SYMPTOM: "Have you ever had sinus problems before?" If so, ask: "When was the last time?" and "What happened that time?"      yes 5. NASAL CONGESTION: "Is the nose blocked?" If so, ask, "Can you open it or must you breathe through the mouth?"     Yes nasal congestion; can breath out of mouth 6. NASAL DISCHARGE: "Do you have discharge from your nose?" If so ask, "What color?"     green 7. FEVER: "Do you have a fever?" If so, ask: "What is it, how was it  measured, and when did it start?"      no 8. OTHER SYMPTOMS: "Do you have any other symptoms?" (e.g., sore throat, cough, earache, difficulty breathing)     Feels like head cold, hoarseness in the morning 9. PREGNANCY: "Is there any chance you are pregnant?" "When was your last menstrual period?"     no  Protocols used: SINUS PAIN OR CONGESTION-A-AH

## 2018-03-30 NOTE — Progress Notes (Signed)
Patient: Leah Cameron MRN: [id number] DOB: Sep 17, 1950 PCP: Colon Branch, MD     Subjective:  Chief Complaint  Patient presents with  . sinus congestion    HPI: The patient is a 67 y.o. female who presents today for sinus congestion. She states symptoms started on 03/03/2018. Went to see her doctor and told she had a viral cold and they did otc medication. She states her throat got better with the humidifier. She states her nose continues to be congested and the mucous is thicker and green in color. Her mouth is also really dry. She has been using her astelin spray nightly. No fever/chills. Her teeth hurt as well. No shortness of breath or wheezing. No symptoms in her lungs.   Review of Systems  Constitutional: Positive for fatigue. Negative for chills and fever.  HENT: Positive for postnasal drip, rhinorrhea and sore throat. Negative for ear pain, sinus pressure and sinus pain.   Respiratory: Negative for cough and shortness of breath.   Cardiovascular: Negative for chest pain.  Gastrointestinal: Negative for abdominal pain and nausea.  Musculoskeletal: Negative for back pain and neck pain.  Neurological: Positive for dizziness. Negative for headaches.  Psychiatric/Behavioral: Positive for sleep disturbance.    Allergies Patient is allergic to cyclobenzaprine hcl; chlorzoxazone; hydrocodone-acetaminophen; penicillins; and tramadol hcl.  Past Medical History Patient  has a past medical history of Allergic rhinitis, Back pain, chronic, Fibromyalgia, GERD (gastroesophageal reflux disease), HPV (human papilloma virus) infection, Hypertension, Menopause, RLS (restless legs syndrome), Scoliosis, Stricture esophagus, Syncope (4/11), Thyromegaly, and Vertigo.  Surgical History Patient  has a past surgical history that includes Breast lumpectomy (2000); Knee surgery (1999/2002); Knee arthroscopy (2003); Shoulder surgery (2004); Biopsy thyroid (01-2009); Tonsillectomy; Colonoscopy; Back  surgery (09-05-13); and Trigger finger release (08-2014 ).  Family History Pateint's family history includes Breast cancer in an other family member; Coronary artery disease in an other family member; Dementia in her sister; Diabetes in an other family member; Lung cancer in an other family member; Stroke in her mother and sister; Thyroid disease in her mother.  Social History Patient  reports that she has never smoked. She has never used smokeless tobacco. She reports current alcohol use. She reports that she does not use drugs.    Objective: Vitals:   03/30/18 1411  BP: 124/84  Pulse: 82  Temp: 98.1 F (36.7 C)  TempSrc: Oral  SpO2: 99%  Weight: 201 lb (91.2 kg)  Height: 5\' 8"  (1.727 m)    Body mass index is 30.56 kg/m.  Physical Exam Vitals signs reviewed.  Constitutional:      Appearance: Normal appearance.  HENT:     Right Ear: Tympanic membrane, ear canal and external ear normal.     Left Ear: Tympanic membrane, ear canal and external ear normal.     Nose: Congestion present.     Comments: Right severely edematous turbinate. Left mild TTP over bilateral maxillary sinuses     Mouth/Throat:     Comments: Mildly enlarged left submandibular gland.  Cardiovascular:     Rate and Rhythm: Normal rate and regular rhythm.     Heart sounds: Normal heart sounds.  Pulmonary:     Effort: Pulmonary effort is normal.     Breath sounds: Normal breath sounds.  Abdominal:     General: Abdomen is flat. Bowel sounds are normal.     Palpations: Abdomen is soft.  Lymphadenopathy:     Cervical: No cervical adenopathy.  Skin:    Capillary Refill: Capillary  refill takes less than 2 seconds.  Neurological:     Mental Status: She is alert.        Assessment/plan: 1. Acute non-recurrent maxillary sinusitis zpack since allergy to PCN. Will add on flonase as well since her anti histamine spray is not helping with the congestion, continue with cool mist humidifier. Did offer steroid shot  and she declined at this time.  -?salivary stone: enlarged left submandibular gland with dry mouth. Recommended she do warm compresses and massages as well as suck on lozenges.  If not better see PCP.     Return if symptoms worsen or fail to improve.   Orma Flaming, MD [place]   03/30/2018

## 2018-03-30 NOTE — Telephone Encounter (Signed)
Noted  

## 2018-03-30 NOTE — Telephone Encounter (Signed)
See note

## 2018-05-10 ENCOUNTER — Other Ambulatory Visit: Payer: Self-pay | Admitting: Internal Medicine

## 2018-05-12 ENCOUNTER — Other Ambulatory Visit: Payer: Self-pay | Admitting: Internal Medicine

## 2018-05-12 ENCOUNTER — Telehealth: Payer: Self-pay | Admitting: Internal Medicine

## 2018-05-15 NOTE — Telephone Encounter (Signed)
Sent!

## 2018-05-15 NOTE — Telephone Encounter (Signed)
Pt is requesting refill on Ambien.   Last OV: 03/06/2018 Last Fill: 02/21/2018 #90 and 0RF UDS: None  NCCR in media 02/21/2018

## 2018-05-17 ENCOUNTER — Encounter: Payer: Self-pay | Admitting: Internal Medicine

## 2018-05-22 ENCOUNTER — Telehealth: Payer: Self-pay

## 2018-05-22 DIAGNOSIS — Z0279 Encounter for issue of other medical certificate: Secondary | ICD-10-CM

## 2018-05-22 NOTE — Telephone Encounter (Signed)
PA approved.   Request Reference Number: [id number]. ZOLPIDEM TAB 5MG  is approved through 04/12/2019. For further questions, call [phone]

## 2018-05-22 NOTE — Telephone Encounter (Signed)
PA initiated via Covermymeds; KEY: [id number]. Awaiting determination.

## 2018-05-23 ENCOUNTER — Ambulatory Visit: Payer: Medicare Other | Admitting: Internal Medicine

## 2018-05-24 ENCOUNTER — Encounter: Payer: Medicare Other | Admitting: Internal Medicine

## 2018-06-03 ENCOUNTER — Other Ambulatory Visit: Payer: Self-pay | Admitting: Internal Medicine

## 2018-06-05 ENCOUNTER — Other Ambulatory Visit: Payer: Self-pay

## 2018-06-05 MED ORDER — AZELASTINE HCL 0.1 % NA SOLN: 2.0000 | Freq: Every evening | NASAL | 0 refills | Status: DC | PRN

## 2018-06-13 ENCOUNTER — Encounter: Payer: Self-pay | Admitting: Internal Medicine

## 2018-06-13 ENCOUNTER — Ambulatory Visit (INDEPENDENT_AMBULATORY_CARE_PROVIDER_SITE_OTHER): Payer: Medicare Other | Admitting: Internal Medicine

## 2018-06-13 ENCOUNTER — Telehealth: Payer: Self-pay

## 2018-06-13 VITALS — BP 126/68 | HR 88 | Temp 98.7°F | Resp 16 | Ht 68.0 in | Wt 193.1 lb

## 2018-06-13 DIAGNOSIS — Z23 Encounter for immunization: Secondary | ICD-10-CM | POA: Diagnosis not present

## 2018-06-13 DIAGNOSIS — Z0000 Encounter for general adult medical examination without abnormal findings: Secondary | ICD-10-CM

## 2018-06-13 DIAGNOSIS — F411 Generalized anxiety disorder: Secondary | ICD-10-CM

## 2018-06-13 LAB — LIPID PANEL
CHOLESTEROL: 183 mg/dL (ref 0–200)
HDL: 41.1 mg/dL (ref >39.00)
LDL Cholesterol: 110 mg/dL — ABNORMAL HIGH (ref 0–99)
NONHDL: 142.16
Total CHOL/HDL Ratio: 4
Triglycerides: 162 mg/dL — ABNORMAL HIGH (ref 0.0–149.0)
VLDL: 32.4 mg/dL (ref 0.0–40.0)

## 2018-06-13 LAB — CBC WITH DIFFERENTIAL/PLATELET
BASOS PCT: 1.2 % (ref 0.0–3.0)
Basophils Absolute: 0.1 10*3/uL (ref 0.0–0.1)
EOS PCT: 3.1 % (ref 0.0–5.0)
Eosinophils Absolute: 0.2 10*3/uL (ref 0.0–0.7)
HCT: 39.4 % (ref 36.0–46.0)
Hemoglobin: 12.8 g/dL (ref 12.0–15.0)
LYMPHS ABS: 1.9 10*3/uL (ref 0.7–4.0)
Lymphocytes Relative: 33.6 % (ref 12.0–46.0)
MCHC: 32.5 g/dL (ref 30.0–36.0)
MCV: 81.7 fl (ref 78.0–100.0)
MONO ABS: 0.4 10*3/uL (ref 0.1–1.0)
MONOS PCT: 7.2 % (ref 3.0–12.0)
NEUTROS ABS: 3 10*3/uL (ref 1.4–7.7)
NEUTROS PCT: 54.9 % (ref 43.0–77.0)
Platelets: 167 10*3/uL (ref 150.0–400.0)
RBC: 4.83 Mil/uL (ref 3.87–5.11)
RDW: 15.4 % (ref 11.5–15.5)
WBC: 5.5 10*3/uL (ref 4.0–10.5)

## 2018-06-13 LAB — COMPREHENSIVE METABOLIC PANEL
ALK PHOS: 97 U/L (ref 39–117)
ALT: 20 U/L (ref 0–35)
AST: 17 U/L (ref 0–37)
Albumin: 4.6 g/dL (ref 3.5–5.2)
BILIRUBIN TOTAL: 0.5 mg/dL (ref 0.2–1.2)
BUN: 21 mg/dL (ref 6–23)
CALCIUM: 9.9 mg/dL (ref 8.4–10.5)
CO2: 27 mEq/L (ref 19–32)
Chloride: 103 mEq/L (ref 96–112)
Creatinine, Ser: 0.95 mg/dL (ref 0.40–1.20)
GFR: 71.05 mL/min (ref >60.00)
Glucose, Bld: 94 mg/dL (ref 70–99)
POTASSIUM: 4.4 meq/L (ref 3.5–5.1)
Sodium: 138 mEq/L (ref 135–145)
TOTAL PROTEIN: 7.1 g/dL (ref 6.0–8.3)

## 2018-06-13 MED ORDER — LANSOPRAZOLE 30 MG PO CPDR: 30.0000 mg | DELAYED_RELEASE_CAPSULE | Freq: Two times a day (BID) | ORAL | 3 refills | Status: DC

## 2018-06-13 MED ORDER — ZOSTER VAC RECOMB ADJUVANTED 50 MCG/0.5ML IM SUSR: 0.5000 mL | Freq: Once | INTRAMUSCULAR | 1 refills | Status: AC

## 2018-06-13 MED ORDER — ESCITALOPRAM OXALATE 10 MG PO TABS: 10.0000 mg | ORAL_TABLET | Freq: Every day | ORAL | 3 refills | Status: DC

## 2018-06-13 MED ORDER — BENAZEPRIL HCL 40 MG PO TABS: 40.0000 mg | ORAL_TABLET | Freq: Every day | ORAL | 3 refills | Status: DC

## 2018-06-13 MED ORDER — SPIRONOLACTONE 25 MG PO TABS: 25.0000 mg | ORAL_TABLET | Freq: Every day | ORAL | 3 refills | Status: DC

## 2018-06-13 MED ORDER — AMLODIPINE BESYLATE 5 MG PO TABS: 5.0000 mg | ORAL_TABLET | Freq: Every day | ORAL | 3 refills | Status: DC

## 2018-06-13 NOTE — Progress Notes (Signed)
Subjective:    Patient ID: Leah Cameron, female    DOB: 12-Oct-1950, 68 y.o.   MRN: [id number]  DOS:  06/13/2018 Type of visit - description: cpx No concerns except for the right ear feeling stopped up, no discharge or pain.  Review of Systems  Other than above, a 14 point review of systems is negative     Past Medical History:  Diagnosis Date  . Allergic rhinitis   . Back pain, chronic   . Fibromyalgia    sees Dr.Davenshwar  . GERD (gastroesophageal reflux disease)   . HPV (human papilloma virus) infection   . Hypertension   . Menopause    on lexapro was started by gyn  . RLS (restless legs syndrome)   . Scoliosis   . Stricture esophagus    last EGD and dilatation 3/09  . Syncope 4/11   admitted thought to be due to over treatment of hypertension  . Thyromegaly   . Vertigo    went to ED on 10/30    Past Surgical History:  Procedure Laterality Date  . BACK SURGERY  09-05-13   cage and rods L4 L5 (@ [facility])  . BIOPSY THYROID  01-2009   negative   . BREAST LUMPECTOMY  2000  . COLONOSCOPY    . KNEE ARTHROSCOPY  2003   right  . KNEE SURGERY  1999/2002   left   . SHOULDER SURGERY  2004  . TONSILLECTOMY    . TRIGGER FINGER RELEASE  08-2014    R THUMB    Social History   Socioeconomic History  . Marital status: Married    Spouse name: Not on file  . Number of children: 2  . Years of education: Not on file  . Highest education level: Not on file  Occupational History  . Occupation:  Retired 2001, Public relations account executive, disable    Social Needs  . Financial resource strain: Not on file  . Food insecurity:    Worry: Not on file    Inability: Not on file  . Transportation needs:    Medical: Not on file    Non-medical: Not on file  Tobacco Use  . Smoking status: Never Smoker  . Smokeless tobacco: Never Used  Substance and Sexual Activity  . Alcohol use: Yes    Comment: socially   . Drug use: No  . Sexual activity: Not on file  Lifestyle  . Physical  activity:    Days per week: Not on file    Minutes per session: Not on file  . Stress: Not on file  Relationships  . Social connections:    Talks on phone: Not on file    Gets together: Not on file    Attends religious service: Not on file    Active member of club or organization: Not on file    Attends meetings of clubs or organizations: Not on file    Relationship status: Not on file  . Intimate partner violence:    Fear of current or ex partner: Not on file    Emotionally abused: Not on file    Physically abused: Not on file    Forced sexual activity: Not on file  Other Topics Concern  . Not on file  Social History Narrative   Disable   Keeps a g-child on-off           Family History  Problem Relation Age of Onset  . Stroke Mother  M, F, 1/2 sister, sister   . Thyroid disease Mother   . Breast cancer Other        1/2 sister  . Diabetes Other        aunt  . Coronary artery disease Other        GF late in life  . Lung cancer Other        cousin  . Stroke Sister   . Dementia Sister   . Colon cancer Neg Hx      Allergies as of 06/13/2018      Reactions   Cyclobenzaprine Hcl Other (See Comments)   Face swelling   Chlorzoxazone Rash, Swelling   Hydrocodone-acetaminophen Rash   Penicillins Swelling, Rash   Tolerated cefazolin   Tramadol Hcl Itching, Rash      Medication List       Accurate as of June 13, 2018  8:59 PM. Always use your most recent med list.        amLODipine 5 MG tablet Commonly known as:  NORVASC Take 1 tablet (5 mg total) by mouth daily.   azelastine 0.1 % nasal spray Commonly known as:  ASTELIN Place 2 sprays into both nostrils at bedtime as needed for rhinitis or allergies. Use in each nostril as directed   benazepril 40 MG tablet Commonly known as:  LOTENSIN Take 1 tablet (40 mg total) by mouth daily.   CALTRATE 600+D 600-400 MG-UNIT chew tablet Generic drug:  Calcium Carbonate-Vitamin D Chew 1 tablet by mouth 2 (two)  times daily.   escitalopram 10 MG tablet Commonly known as:  LEXAPRO Take 1 tablet (10 mg total) by mouth daily.   fluticasone 50 MCG/ACT nasal spray Commonly known as:  FLONASE Place 2 sprays into both nostrils daily.   lansoprazole 30 MG capsule Commonly known as:  PREVACID Take 1 capsule (30 mg total) by mouth 2 (two) times daily before a meal.   multivitamin with minerals Tabs tablet Take 1 tablet by mouth daily.   spironolactone 25 MG tablet Commonly known as:  ALDACTONE Take 1 tablet (25 mg total) by mouth daily.   VITAMIN C PO Take 1 tablet by mouth daily.   zolpidem 5 MG tablet Commonly known as:  AMBIEN TAKE 1 TABLET (5 MG TOTAL) BY MOUTH AT BEDTIME AS NEEDED FOR SLEEP.   Zoster Vaccine Adjuvanted injection Commonly known as:  SHINGRIX Inject 0.5 mLs into the muscle once for 1 dose.           Objective:   Physical Exam BP 126/68 (BP Location: Left Arm, Patient Position: Sitting, Cuff Size: Small)   Pulse 88   Temp 98.7 F (37.1 C) (Oral)   Resp 16   Ht 5\' 8"  (1.727 m)   Wt 193 lb 2 oz (87.6 kg)   LMP  (LMP Unknown)   SpO2 98%   BMI 29.36 kg/m  General: Well developed, NAD, BMI noted Neck: Symmetric enlargement of the thyroid gland.  Not tender, no dominant nodule. HEENT:  Normocephalic . Face symmetric, atraumatic.  Abundant wax, right ear. Lungs:  CTA B Normal respiratory effort, no intercostal retractions, no accessory muscle use. Heart: RRR,  no murmur.  No pretibial edema bilaterally  Abdomen:  Not distended, soft, non-tender. No rebound or rigidity.   Skin: Exposed areas without rash. Not pale. Not jaundice Neurologic:  alert & oriented X3.  Speech normal, gait appropriate for age and unassisted Strength symmetric and appropriate for age.  Psych: Cognition and judgment appear intact.  Cooperative with normal attention span and concentration.  Behavior appropriate. No anxious or depressed appearing.     Assessment      Assessment HTN --hypokalemia, ?primary hyperaldosteronism Anxiety, insomnia --- start to rx ambien here ~ 04-2015 (previously by rheumatology) Multinodular goiter, Dr. Dwyane Dee, seen 07-2014  ---01-2009 thyroid Bx (-), BX again 11-2014 GI: --GERD  --esophageal stricture, dilatation  2009 MSK: --fibromyalgia >>> stop seen Dr Herold Harms by the end of 2016 b/c she felt  better  --Chronic back pain -- resolved after 2015 back surgery --RLS -- Osteopenia: DEXA  11/28/13 Tscore -2.7; DEXA 09/09/16 T score -2.4 Syncope 2011 (? due to over rx of HTN) Self dc ASA 2016 d/t easy bruising   PLAN:  HTN: Continue amlodipine, Aldactone, Lotensin.  Checking labs Anxiety, insomnia: Currently well controlled on Lexapro and Ambien, contract today. Multinodular goiter: Last ultrasound 2016, physical exam seems at baseline, consider [place]  next year.  Last TSH normal. Osteopenia: Encourage vitamin D supplements, exercise.  Next bone density test 2021. Cerumen impaction: Removed with a spoon, right ear.  Recommend peroxide. RTC 6 to 8 months

## 2018-06-13 NOTE — Assessment & Plan Note (Signed)
HTN: Continue amlodipine, Aldactone, Lotensin.  Checking labs Anxiety, insomnia: Currently well controlled on Lexapro and Ambien, contract today. Multinodular goiter: Last ultrasound 2016, physical exam seems at baseline, consider [place]  next year.  Last TSH normal. Osteopenia: Encourage vitamin D supplements, exercise.  Next bone density test 2021. Cerumen impaction: Removed with a spoon, right ear.  Recommend peroxide. RTC 6 to 8 months

## 2018-06-13 NOTE — Patient Instructions (Addendum)
Please schedule Medicare Wellness with Glenard Haring.   GO TO THE LAB : Get the blood work     GO TO THE FRONT DESK Schedule your next appointment for a checkup in 6 to 8 months, no fasting

## 2018-06-13 NOTE — Assessment & Plan Note (Addendum)
--  Td 2019; pnm 13: 2018; pnm 23: 06/2018;  Zostavax: 2015; had a flu shot  shingrex printed. -- Female care:  Dr Renelda Loma last [place] 12/2017, had a PAP; [facility] last 08/2017, had a (-) BX per pt  -- CCS: Colonoscopy:  01/10/2002 and 10- 2013, neg , 10 years  -- Labs CMP, FLP, CBC -- Diet and exercise discussed

## 2018-06-13 NOTE — Telephone Encounter (Signed)
Pt received $29 dollar PA charge for Ambien. I thought we were writing these off since insurance wasn't covering.

## 2018-06-13 NOTE — Progress Notes (Signed)
Pre visit review using our clinic review tool, if applicable. No additional management support is needed unless otherwise documented below in the visit note. 

## 2018-07-05 ENCOUNTER — Other Ambulatory Visit: Payer: Self-pay | Admitting: Internal Medicine

## 2018-07-31 ENCOUNTER — Telehealth: Payer: Self-pay | Admitting: Internal Medicine

## 2018-07-31 NOTE — Telephone Encounter (Signed)
Received letter from pt questioning a $29 charge for "Special Reports or Forms" on 05/22/18.  This was for paperwork completion for a prior authorization for a medication.  Per Larene Beach I emailed [employer] to have the payment the pt made towards this fee returned/credited to the pt.  I called the pt & spoke with her to make her aware of this.

## 2018-08-05 ENCOUNTER — Telehealth: Payer: Self-pay | Admitting: Internal Medicine

## 2018-08-07 NOTE — Telephone Encounter (Signed)
Pt is requesting refill on Ambien.   Last OV: 06/13/2018 Last Fill: 05/15/2018 #90 and 0RF (Several days early) UDS: None

## 2018-08-07 NOTE — Telephone Encounter (Signed)
sent 

## 2018-09-20 ENCOUNTER — Other Ambulatory Visit: Payer: Self-pay | Admitting: Internal Medicine

## 2018-09-20 DIAGNOSIS — Z1231 Encounter for screening mammogram for malignant neoplasm of breast: Secondary | ICD-10-CM

## 2018-09-29 ENCOUNTER — Other Ambulatory Visit: Payer: Self-pay

## 2018-09-29 ENCOUNTER — Ambulatory Visit
Admission: RE | Admit: 2018-09-29 | Discharge: 2018-09-29 | Disposition: A | Discharge status: Home / Self Care | Payer: Medicare Other | Source: Ambulatory Visit | Attending: Internal Medicine | Admitting: Internal Medicine

## 2018-09-29 DIAGNOSIS — Z1231 Encounter for screening mammogram for malignant neoplasm of breast: Secondary | ICD-10-CM

## 2018-11-17 ENCOUNTER — Telehealth: Payer: Self-pay | Admitting: Internal Medicine

## 2018-11-17 NOTE — Telephone Encounter (Signed)
sent 

## 2018-11-17 NOTE — Telephone Encounter (Signed)
Ambien refill.   Last OV: 06/13/2018 Last Fill: 08/07/2018 #90 and 0RF Pt sig: 1 tab qhs prn UDS: Ambien only

## 2019-01-16 ENCOUNTER — Other Ambulatory Visit: Payer: Self-pay

## 2019-01-16 ENCOUNTER — Ambulatory Visit (INDEPENDENT_AMBULATORY_CARE_PROVIDER_SITE_OTHER): Payer: Medicare Other | Admitting: Internal Medicine

## 2019-01-16 DIAGNOSIS — F411 Generalized anxiety disorder: Secondary | ICD-10-CM

## 2019-01-16 DIAGNOSIS — K219 Gastro-esophageal reflux disease without esophagitis: Secondary | ICD-10-CM

## 2019-01-16 DIAGNOSIS — G4700 Insomnia, unspecified: Secondary | ICD-10-CM | POA: Diagnosis not present

## 2019-01-16 DIAGNOSIS — M8580 Other specified disorders of bone density and structure, unspecified site: Secondary | ICD-10-CM

## 2019-01-16 NOTE — Progress Notes (Signed)
Subjective:    Patient ID: Leah Cameron, female    DOB: [birth year], 68 y.o.   MRN: [id number]  DOS:  01/16/2019 Type of visit - description: Virtual Visit via Video Note  I connected with@   by a video enabled telemedicine application and verified that I am speaking with the correct person using two identifiers.   THIS ENCOUNTER IS A VIRTUAL VISIT DUE TO COVID-19 - PATIENT WAS NOT SEEN IN THE OFFICE. PATIENT HAS CONSENTED TO VIRTUAL VISIT / TELEMEDICINE VISIT   Location of patient: home  Location of provider: office  I discussed the limitations of evaluation and management by telemedicine and the availability of in person appointments. The patient expressed understanding and agreed to proceed.  History of Present Illness: Routine visit In general feeling well. We review her medications, not taking PPIs. We also review her most recent labs Recently had a flu shot.   Review of Systems Denies heartburn No fever chills No chest pain no difficulty breathing  Past Medical History:  Diagnosis Date  . Allergic rhinitis   . Back pain, chronic   . Fibromyalgia    sees Dr.Davenshwar  . GERD (gastroesophageal reflux disease)   . HPV (human papilloma virus) infection   . Hypertension   . Menopause    on lexapro was started by gyn  . RLS (restless legs syndrome)   . Scoliosis   . Stricture esophagus    last EGD and dilatation 3/09  . Syncope 4/11   admitted thought to be due to over treatment of hypertension  . Thyromegaly   . Vertigo    went to ED on 10/30    Past Surgical History:  Procedure Laterality Date  . BACK SURGERY  09-05-13   cage and rods L4 L5 (@ [facility])  . BIOPSY THYROID  01-2009   negative   . BREAST LUMPECTOMY  2000  . COLONOSCOPY    . KNEE ARTHROSCOPY  2003   right  . KNEE SURGERY  1999/2002   left   . SHOULDER SURGERY  2004  . TONSILLECTOMY    . TRIGGER FINGER RELEASE  08-2014    R THUMB    Social History   Socioeconomic History  .  Marital status: Married    Spouse name: Not on file  . Number of children: 2  . Years of education: Not on file  . Highest education level: Not on file  Occupational History  . Occupation:  Retired 2001, Public relations account executive, disable    Social Needs  . Financial resource strain: Not on file  . Food insecurity    Worry: Not on file    Inability: Not on file  . Transportation needs    Medical: Not on file    Non-medical: Not on file  Tobacco Use  . Smoking status: Never Smoker  . Smokeless tobacco: Never Used  Substance and Sexual Activity  . Alcohol use: Yes    Comment: socially   . Drug use: No  . Sexual activity: Not on file  Lifestyle  . Physical activity    Days per week: Not on file    Minutes per session: Not on file  . Stress: Not on file  Relationships  . Social Herbalist on phone: Not on file    Gets together: Not on file    Attends religious service: Not on file    Active member of club or organization: Not on file    Attends meetings  of clubs or organizations: Not on file    Relationship status: Not on file  . Intimate partner violence    Fear of current or ex partner: Not on file    Emotionally abused: Not on file    Physically abused: Not on file    Forced sexual activity: Not on file  Other Topics Concern  . Not on file  Social History Narrative   Disable   Keeps a g-child on-off            Allergies as of 01/16/2019      Reactions   Cyclobenzaprine Hcl Other (See Comments)   Face swelling   Chlorzoxazone Rash, Swelling   Hydrocodone-acetaminophen Rash   Penicillins Swelling, Rash   Tolerated cefazolin   Tramadol Hcl Itching, Rash      Medication List       Accurate as of January 16, 2019 11:59 PM. If you have any questions, ask your nurse or doctor.        STOP taking these medications   lansoprazole 30 MG capsule Commonly known as: PREVACID Stopped by: Kathlene November, MD     TAKE these medications   amLODipine 5 MG tablet  Commonly known as: NORVASC Take 1 tablet (5 mg total) by mouth daily.   azelastine 0.1 % nasal spray Commonly known as: ASTELIN Place 2 sprays into both nostrils at bedtime as needed for rhinitis or allergies. Use in each nostril as directed   benazepril 40 MG tablet Commonly known as: LOTENSIN Take 1 tablet (40 mg total) by mouth daily.   Caltrate 600+D 600-400 MG-UNIT chew tablet Generic drug: Calcium Carbonate-Vitamin D Chew 1 tablet by mouth 2 (two) times daily.   escitalopram 10 MG tablet Commonly known as: LEXAPRO Take 1 tablet (10 mg total) by mouth daily.   fluticasone 50 MCG/ACT nasal spray Commonly known as: FLONASE Place 2 sprays into both nostrils daily.   multivitamin with minerals Tabs tablet Take 1 tablet by mouth daily.   spironolactone 25 MG tablet Commonly known as: ALDACTONE Take 1 tablet (25 mg total) by mouth daily.   VITAMIN C PO Take 1 tablet by mouth daily.   zolpidem 5 MG tablet Commonly known as: AMBIEN TAKE 1 TABLET (5 MG TOTAL) BY MOUTH AT BEDTIME AS NEEDED FOR SLEEP.           Objective:   Physical Exam LMP  (LMP Unknown)  This is a virtual video visit, alert oriented x3, in no apparent distress.    Assessment     Assessment HTN --hypokalemia, ?primary hyperaldosteronism Anxiety, insomnia --- start to rx ambien here ~ 04-2015 (previously by rheumatology) Multinodular goiter, Dr. Dwyane Dee, seen 07-2014  ---01-2009 thyroid Bx (-), BX again 11-2014 GI: --GERD  --esophageal stricture, dilatation  2009 MSK: --fibromyalgia >>> stop seen Dr Herold Harms by the end of 2016 b/c she felt  better  --Chronic back pain -- resolved after 2015 back surgery --RLS -- Osteopenia: DEXA  11/28/13 Tscore -2.7; DEXA 09/09/16 T score -2.4 Syncope 2011 (? due to over rx of HTN) Self dc ASA 2016 d/t easy bruising   PLAN:   HTN: Good compliance with amlodipine, Lotensin, Aldactone.   Ideally I will get a BMP but will do that when she comes back Anxiety,  insomnia: Continue Lexapro, Ambien.  Symptoms controlled. Osteopenia: Patient concerned about her next bone density test, due for next year.  On calcium and vitamin D. GERD: Off PPIs, drinking vinegar in the mornings with control of symptoms  Had a flu shot Will need another Shingrix prescription when she comes back.  Could not proceed due to the quarantine. RTC 06-2019 CPX    I discussed the assessment and treatment plan with the patient. The patient was provided an opportunity to ask questions and all were answered. The patient agreed with the plan and demonstrated an understanding of the instructions.   The patient was advised to call back or seek an in-person evaluation if the symptoms worsen or if the condition fails to improve as anticipated.

## 2019-01-17 DIAGNOSIS — G4700 Insomnia, unspecified: Secondary | ICD-10-CM | POA: Insufficient documentation

## 2019-01-17 NOTE — Assessment & Plan Note (Signed)
PLAN:   HTN: Good compliance with amlodipine, Lotensin, Aldactone.   Ideally I will get a BMP but will do that when she comes back Anxiety, insomnia: Continue Lexapro, Ambien.  Symptoms controlled. Osteopenia: Patient concerned about her next bone density test, due for next year.  On calcium and vitamin D. GERD: Off PPIs, drinking vinegar in the mornings with control of symptoms Had a flu shot Will need another Shingrix prescription when she comes back.  Could not proceed due to the quarantine. RTC 06-2019 CPX

## 2019-02-15 ENCOUNTER — Telehealth: Payer: Self-pay | Admitting: Internal Medicine

## 2019-02-16 NOTE — Telephone Encounter (Signed)
Sent, PDMP okay 

## 2019-02-16 NOTE — Telephone Encounter (Signed)
Ambien refill.   Last OV: 01/16/2019 Last Fill: 11/17/2018 #90 and 0RF Pt sig: 1 tab qhs prn UDS: None, Ambien only

## 2019-05-10 ENCOUNTER — Encounter: Payer: Self-pay | Admitting: Internal Medicine

## 2019-05-10 ENCOUNTER — Other Ambulatory Visit: Payer: Self-pay | Admitting: Internal Medicine

## 2019-05-10 MED ORDER — ZOLPIDEM TARTRATE 10 MG PO TABS: 10.0000 mg | ORAL_TABLET | Freq: Every evening | ORAL | 0 refills | Status: DC | PRN

## 2019-07-08 ENCOUNTER — Other Ambulatory Visit: Payer: Self-pay | Admitting: Internal Medicine

## 2019-08-04 ENCOUNTER — Other Ambulatory Visit: Payer: Self-pay | Admitting: Internal Medicine

## 2019-08-08 ENCOUNTER — Other Ambulatory Visit: Payer: Self-pay | Admitting: Internal Medicine

## 2019-08-08 ENCOUNTER — Encounter: Payer: Self-pay | Admitting: Internal Medicine

## 2019-08-08 ENCOUNTER — Ambulatory Visit (INDEPENDENT_AMBULATORY_CARE_PROVIDER_SITE_OTHER): Payer: Medicare PPO | Admitting: Internal Medicine

## 2019-08-08 ENCOUNTER — Other Ambulatory Visit: Payer: Self-pay

## 2019-08-08 VITALS — BP 127/61 | HR 86 | Temp 97.0°F | Resp 18 | Ht 68.0 in | Wt 188.4 lb

## 2019-08-08 DIAGNOSIS — E042 Nontoxic multinodular goiter: Secondary | ICD-10-CM | POA: Diagnosis not present

## 2019-08-08 DIAGNOSIS — L918 Other hypertrophic disorders of the skin: Secondary | ICD-10-CM | POA: Diagnosis not present

## 2019-08-08 DIAGNOSIS — Z780 Asymptomatic menopausal state: Secondary | ICD-10-CM

## 2019-08-08 DIAGNOSIS — G4700 Insomnia, unspecified: Secondary | ICD-10-CM

## 2019-08-08 DIAGNOSIS — I10 Essential (primary) hypertension: Secondary | ICD-10-CM | POA: Diagnosis not present

## 2019-08-08 DIAGNOSIS — M8580 Other specified disorders of bone density and structure, unspecified site: Secondary | ICD-10-CM | POA: Diagnosis not present

## 2019-08-08 MED ORDER — ZOLPIDEM TARTRATE 10 MG PO TABS: 10.0000 mg | ORAL_TABLET | Freq: Every evening | ORAL | 1 refills | Status: DC | PRN

## 2019-08-08 NOTE — Patient Instructions (Addendum)
Please schedule Medicare Wellness with Glenard Haring.   We are referring you for: A bone density test Thyroid ultrasound A dermatologist   GO TO THE LAB : Get the blood work     [place], [place] Come back for   a physical exam in 6 months, fasting

## 2019-08-08 NOTE — Progress Notes (Signed)
Subjective:    Patient ID: Leah Cameron, female    DOB: Sep 04, 1950, 69 y.o.   MRN: [id number]  DOS:  08/08/2019 Type of visit - description: Follow-up In general she feels well. We talk about hypertension, insomnia, skin tags, osteopenia, thyroid nodules.  Review of Systems Denies chest pain or difficulty breathing. No edema  Past Medical History:  Diagnosis Date  . Allergic rhinitis   . Back pain, chronic   . Fibromyalgia    sees Dr.Davenshwar  . GERD (gastroesophageal reflux disease)   . HPV (human papilloma virus) infection   . Hypertension   . Menopause    on lexapro was started by gyn  . RLS (restless legs syndrome)   . Scoliosis   . Stricture esophagus    last EGD and dilatation 3/09  . Syncope 4/11   admitted thought to be due to over treatment of hypertension  . Thyromegaly   . Vertigo    went to ED on 10/30    Past Surgical History:  Procedure Laterality Date  . BACK SURGERY  09-05-13   cage and rods L4 L5 (@ [facility])  . BIOPSY THYROID  01-2009   negative   . BREAST LUMPECTOMY  2000  . COLONOSCOPY    . KNEE ARTHROSCOPY  2003   right  . KNEE SURGERY  1999/2002   left   . SHOULDER SURGERY  2004  . TONSILLECTOMY    . TRIGGER FINGER RELEASE  08-2014    R THUMB    Allergies as of 08/08/2019      Reactions   Cyclobenzaprine Hcl Other (See Comments)   Face swelling   Chlorzoxazone Rash, Swelling   Hydrocodone-acetaminophen Rash   Penicillins Swelling, Rash   Tolerated cefazolin   Tramadol Hcl Itching, Rash      Medication List       Accurate as of August 08, 2019  9:50 PM. If you have any questions, ask your nurse or doctor.        amLODipine 5 MG tablet Commonly known as: NORVASC Take 1 tablet (5 mg total) by mouth daily.   azelastine 0.1 % nasal spray Commonly known as: ASTELIN Place 2 sprays into both nostrils at bedtime as needed for rhinitis or allergies. Use in each nostril as directed   benazepril 40 MG tablet Commonly known  as: LOTENSIN Take 1 tablet (40 mg total) by mouth daily.   Caltrate 600+D 600-400 MG-UNIT chew tablet Generic drug: Calcium Carbonate-Vitamin D Chew 1 tablet by mouth 2 (two) times daily.   escitalopram 10 MG tablet Commonly known as: LEXAPRO Take 1 tablet (10 mg total) by mouth daily.   fluticasone 50 MCG/ACT nasal spray Commonly known as: FLONASE Place 2 sprays into both nostrils daily.   multivitamin with minerals Tabs tablet Take 1 tablet by mouth daily.   spironolactone 25 MG tablet Commonly known as: ALDACTONE Take 1 tablet (25 mg total) by mouth daily.   VITAMIN C PO Take 1 tablet by mouth daily.   zolpidem 10 MG tablet Commonly known as: AMBIEN Take 1 tablet (10 mg total) by mouth at bedtime as needed for sleep.          Objective:   Physical Exam BP 127/61 (BP Location: Left Arm, Patient Position: Sitting, Cuff Size: Normal)   Pulse 86   Temp (!) 97 F (36.1 C) (Temporal)   Resp 18   Ht 5\' 8"  (1.727 m)   Wt 188 lb 6 oz (85.4 kg)  LMP  (LMP Unknown)   SpO2 99%   BMI 28.64 kg/m  General:   Well developed, NAD, BMI noted. HEENT:  Normocephalic . Face symmetric, atraumatic Neck: + Thyromegaly,   more noticeable on the left, gland is not TTP Lungs:  CTA B Normal respiratory effort, no intercostal retractions, no accessory muscle use. Heart: RRR,  no murmur.  Lower extremities: no pretibial edema bilaterally  Skin: Not pale. Not jaundice Neurologic:  alert & oriented X3.  Speech normal, gait appropriate for age and unassisted Psych--  Cognition and judgment appear intact.  Cooperative with normal attention span and concentration.  Behavior appropriate. No anxious or depressed appearing.      Assessment     Assessment HTN --hypokalemia, ?primary hyperaldosteronism Anxiety, insomnia --- start to rx ambien here ~ 04-2015 (previously by rheumatology) Multinodular goiter, Dr. Dwyane Dee, seen 07-2014  ---01-2009 thyroid Bx (-), BX again  11-2014 GI: --GERD  --esophageal stricture, dilatation  2009 [facility]: --fibromyalgia >>> stop seen Dr Herold Harms by the end of 2016 b/c she felt  better  --Chronic back pain -- resolved after 2015 back surgery --RLS -- Osteopenia: DEXA  11/28/13 Tscore -2.7; DEXA 09/09/16 T score -2.4 Syncope 2011 (? due to over rx of HTN) Self dc ASA 2016 d/t easy bruising   PLAN:   HTN: Reports good ambulatory BPs, continue amlodipine, Lotensin, Aldactone, check a CMP and CBC.  RF as needed. Anxiety insomnia: Recently, requested to increase Ambien from 5 mg to 10 mg, good control for insomnia, not feeling groggy in the next day.  RF sent. Multinodular goiter: Last ultrasound 5 years ago, had a previous negative biopsy, rec ultrasound and TSH.  Will arrange. Osteopenia: Last T score -2.4 in 2018.  No history of fractures personally or in her family.  Never took HRT.  On OTC calcium and vitamin D.  Check a DEXA. Skin tags: Request a dermatology referral. RTC 6 months CPX   This visit occurred during the SARS-CoV-2 public health emergency.  Safety protocols were in place, including screening questions prior to the visit, additional usage of staff PPE, and extensive cleaning of exam room while observing appropriate contact time as indicated for disinfecting solutions.

## 2019-08-08 NOTE — Assessment & Plan Note (Signed)
HTN: Reports good ambulatory BPs, continue amlodipine, Lotensin, Aldactone, check a CMP and CBC.  RF as needed. Anxiety insomnia: Recently, requested to increase Ambien from 5 mg to 10 mg, good control for insomnia, not feeling groggy in the next day.  RF sent. Multinodular goiter: Last ultrasound 5 years ago, had a previous negative biopsy, rec ultrasound and TSH.  Will arrange. Osteopenia: Last T score -2.4 in 2018.  No history of fractures personally or in her family.  Never took HRT.  On OTC calcium and vitamin D.  Check a DEXA. Skin tags: Request a dermatology referral. RTC 6 months CPX

## 2019-08-08 NOTE — Progress Notes (Signed)
Pre visit review using our clinic review tool, if applicable. No additional management support is needed unless otherwise documented below in the visit note. 

## 2019-08-09 LAB — CBC WITH DIFFERENTIAL/PLATELET
Basophils Absolute: 0.1 10*3/uL (ref 0.0–0.1)
Basophils Relative: 0.9 % (ref 0.0–3.0)
Eosinophils Absolute: 0.2 10*3/uL (ref 0.0–0.7)
Eosinophils Relative: 3.2 % (ref 0.0–5.0)
HCT: 39.6 % (ref 36.0–46.0)
Hemoglobin: 12.6 g/dL (ref 12.0–15.0)
Lymphocytes Relative: 36 % (ref 12.0–46.0)
Lymphs Abs: 2.1 10*3/uL (ref 0.7–4.0)
MCHC: 32 g/dL (ref 30.0–36.0)
MCV: 84.4 fl (ref 78.0–100.0)
Monocytes Absolute: 0.3 10*3/uL (ref 0.1–1.0)
Monocytes Relative: 5.7 % (ref 3.0–12.0)
Neutro Abs: 3.2 10*3/uL (ref 1.4–7.7)
Neutrophils Relative %: 54.2 % (ref 43.0–77.0)
Platelets: 147 10*3/uL — ABNORMAL LOW (ref 150.0–400.0)
RBC: 4.69 Mil/uL (ref 3.87–5.11)
RDW: 14.9 % (ref 11.5–15.5)
WBC: 5.9 10*3/uL (ref 4.0–10.5)

## 2019-08-09 LAB — COMPREHENSIVE METABOLIC PANEL
ALT: 18 U/L (ref 0–35)
AST: 18 U/L (ref 0–37)
Albumin: 4.7 g/dL (ref 3.5–5.2)
Alkaline Phosphatase: 88 U/L (ref 39–117)
BUN: 19 mg/dL (ref 6–23)
CO2: 25 mEq/L (ref 19–32)
Calcium: 10.1 mg/dL (ref 8.4–10.5)
Chloride: 100 mEq/L (ref 96–112)
Creatinine, Ser: 1.05 mg/dL (ref 0.40–1.20)
GFR: 63.08 mL/min (ref >60.00)
Glucose, Bld: 115 mg/dL — ABNORMAL HIGH (ref 70–99)
Potassium: 3.8 mEq/L (ref 3.5–5.1)
Sodium: 136 mEq/L (ref 135–145)
Total Bilirubin: 0.7 mg/dL (ref 0.2–1.2)
Total Protein: 7.4 g/dL (ref 6.0–8.3)

## 2019-08-09 LAB — TSH: TSH: 0.21 u[IU]/mL — ABNORMAL LOW (ref 0.35–4.50)

## 2019-08-22 ENCOUNTER — Ambulatory Visit
Admission: RE | Admit: 2019-08-22 | Discharge: 2019-08-22 | Disposition: A | Discharge status: Home / Self Care | Payer: Medicare PPO | Source: Ambulatory Visit | Attending: Internal Medicine | Admitting: Internal Medicine

## 2019-08-22 DIAGNOSIS — E041 Nontoxic single thyroid nodule: Secondary | ICD-10-CM | POA: Diagnosis not present

## 2019-08-27 ENCOUNTER — Other Ambulatory Visit: Payer: Self-pay | Admitting: Internal Medicine

## 2019-08-27 MED ORDER — ESCITALOPRAM OXALATE 10 MG PO TABS: 10.0000 mg | ORAL_TABLET | Freq: Every day | ORAL | 1 refills | Status: DC

## 2019-08-27 MED ORDER — SPIRONOLACTONE 25 MG PO TABS: 25.0000 mg | ORAL_TABLET | Freq: Every day | ORAL | 1 refills | Status: DC

## 2019-08-27 MED ORDER — AMLODIPINE BESYLATE 5 MG PO TABS: 5.0000 mg | ORAL_TABLET | Freq: Every day | ORAL | 1 refills | Status: DC

## 2019-08-29 DIAGNOSIS — M85851 Other specified disorders of bone density and structure, right thigh: Secondary | ICD-10-CM | POA: Diagnosis not present

## 2019-08-29 DIAGNOSIS — M85852 Other specified disorders of bone density and structure, left thigh: Secondary | ICD-10-CM | POA: Diagnosis not present

## 2019-08-29 LAB — HM DEXA SCAN

## 2019-08-30 ENCOUNTER — Other Ambulatory Visit: Payer: Self-pay | Admitting: Internal Medicine

## 2019-08-30 DIAGNOSIS — Z1231 Encounter for screening mammogram for malignant neoplasm of breast: Secondary | ICD-10-CM

## 2019-09-04 ENCOUNTER — Encounter: Payer: Self-pay | Admitting: Internal Medicine

## 2019-09-12 DIAGNOSIS — H2513 Age-related nuclear cataract, bilateral: Secondary | ICD-10-CM | POA: Diagnosis not present

## 2019-09-12 DIAGNOSIS — H35372 Puckering of macula, left eye: Secondary | ICD-10-CM | POA: Diagnosis not present

## 2019-09-12 DIAGNOSIS — H40023 Open angle with borderline findings, high risk, bilateral: Secondary | ICD-10-CM | POA: Diagnosis not present

## 2019-09-12 DIAGNOSIS — H04123 Dry eye syndrome of bilateral lacrimal glands: Secondary | ICD-10-CM | POA: Diagnosis not present

## 2019-09-26 DIAGNOSIS — Z20822 Contact with and (suspected) exposure to covid-19: Secondary | ICD-10-CM | POA: Diagnosis not present

## 2019-09-26 DIAGNOSIS — R05 Cough: Secondary | ICD-10-CM | POA: Diagnosis not present

## 2019-09-26 DIAGNOSIS — J209 Acute bronchitis, unspecified: Secondary | ICD-10-CM | POA: Diagnosis not present

## 2019-09-26 DIAGNOSIS — I10 Essential (primary) hypertension: Secondary | ICD-10-CM | POA: Diagnosis not present

## 2019-10-02 ENCOUNTER — Other Ambulatory Visit: Payer: Self-pay

## 2019-10-02 ENCOUNTER — Ambulatory Visit
Admission: RE | Admit: 2019-10-02 | Discharge: 2019-10-02 | Disposition: A | Discharge status: Home / Self Care | Payer: Medicare PPO | Source: Ambulatory Visit | Attending: Internal Medicine | Admitting: Internal Medicine

## 2019-10-02 DIAGNOSIS — Z1231 Encounter for screening mammogram for malignant neoplasm of breast: Secondary | ICD-10-CM | POA: Diagnosis not present

## 2019-11-19 ENCOUNTER — Other Ambulatory Visit: Payer: Self-pay | Admitting: Internal Medicine

## 2019-11-21 ENCOUNTER — Other Ambulatory Visit: Payer: Self-pay | Admitting: Internal Medicine

## 2019-11-21 IMAGING — MG DIGITAL SCREENING BILATERAL MAMMOGRAM WITH TOMO AND CAD
8 series · 8 of 24 positions shown · non-contrast
Comparison: Previous exam(s).

CLINICAL DATA: Screening.

EXAM:
DIGITAL SCREENING BILATERAL MAMMOGRAM WITH TOMO AND CAD

[L CC synth-2D]
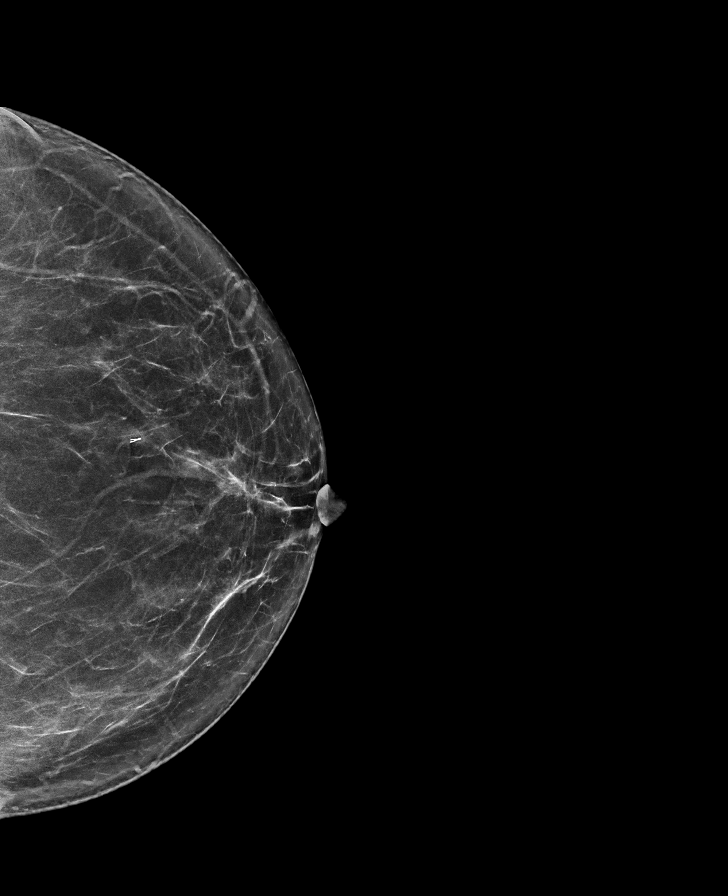

[R CC synth-2D]
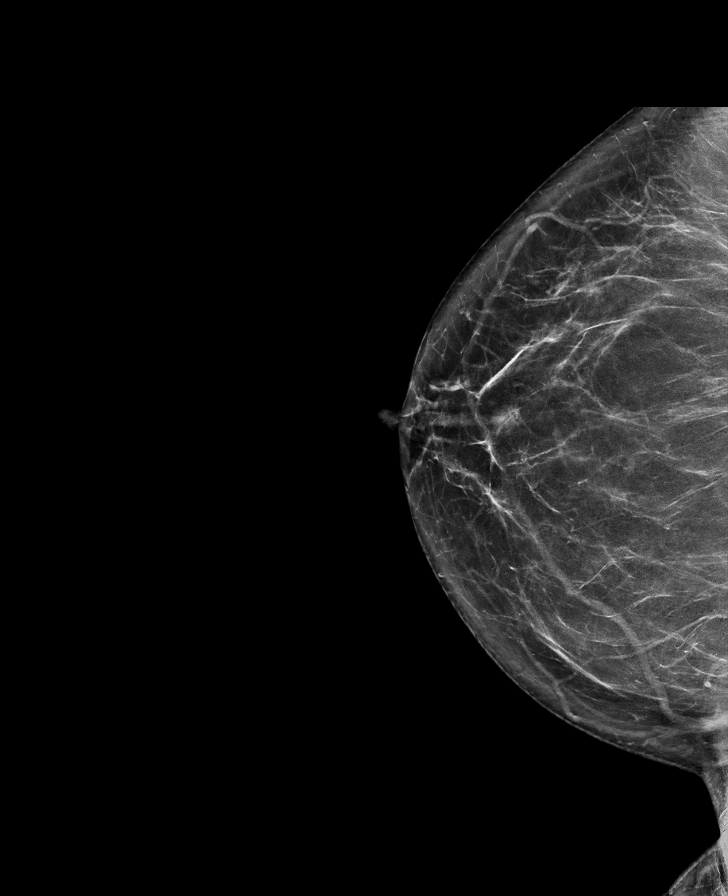

[R MLO synth-2D]
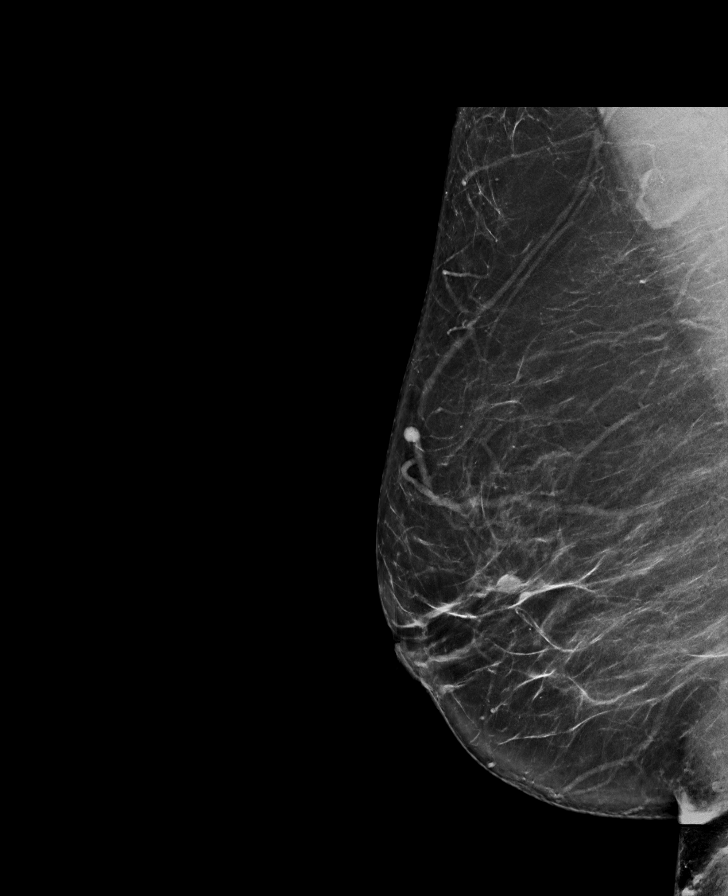

[L MLO synth-2D]
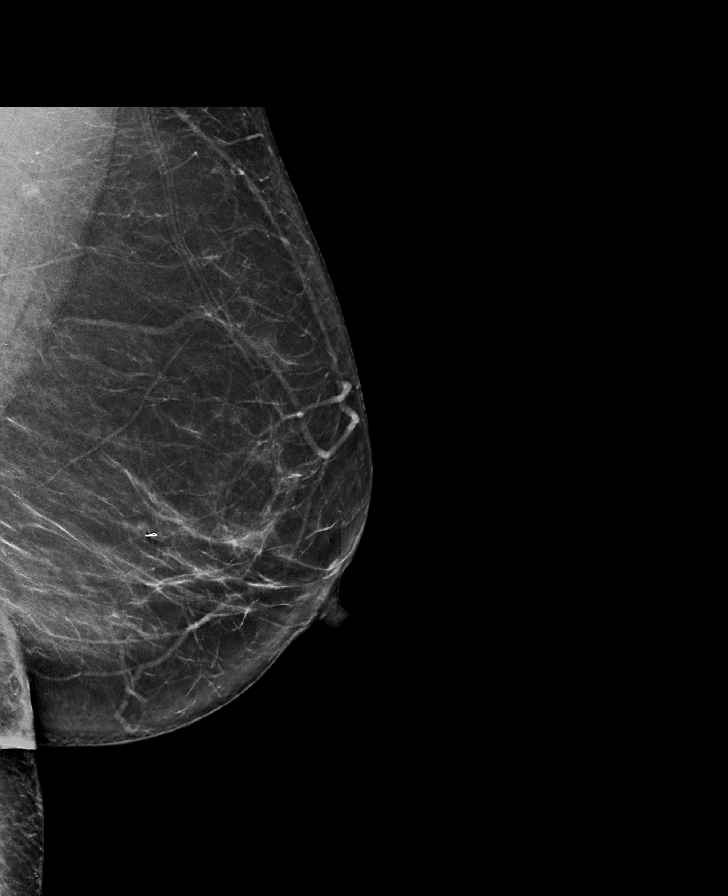

[R CC tomo · tomo slice 39/78.0]
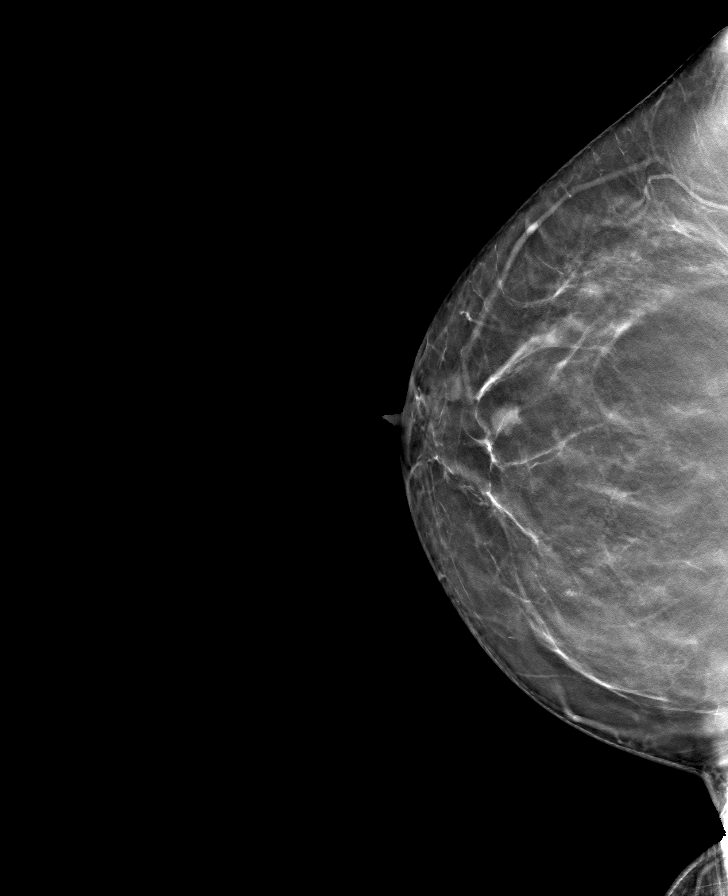

[L CC tomo · tomo slice 37/72.0]
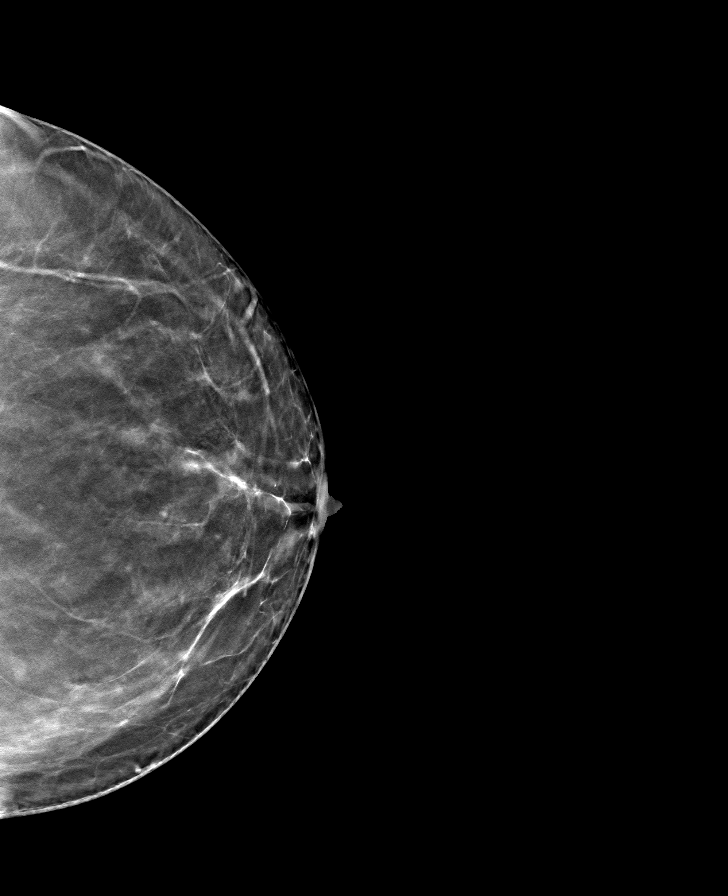

[L MLO tomo · tomo slice 41/81.0]
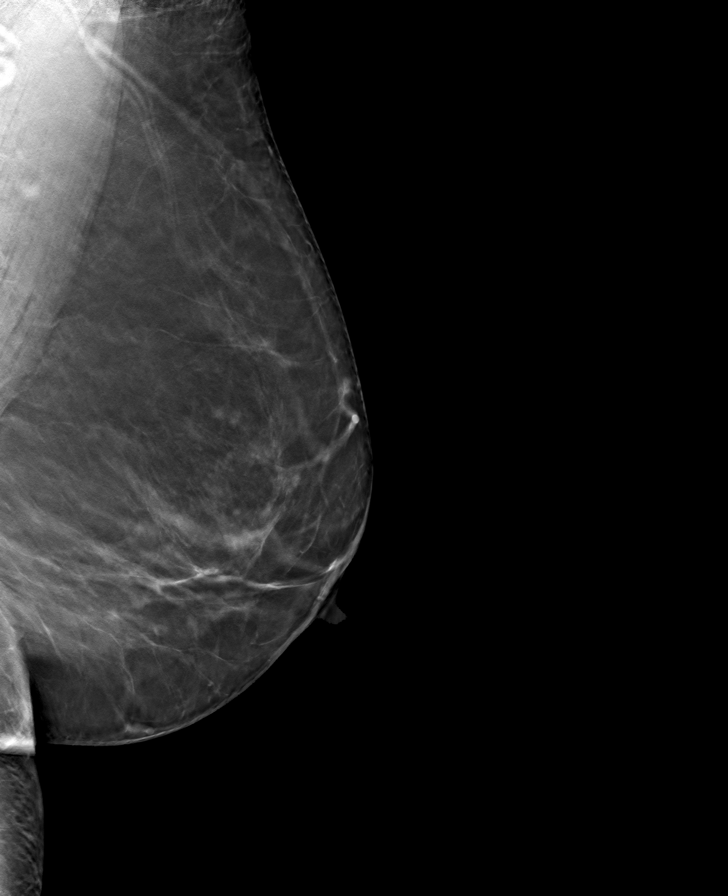

[R MLO tomo · tomo slice 45/88.0]
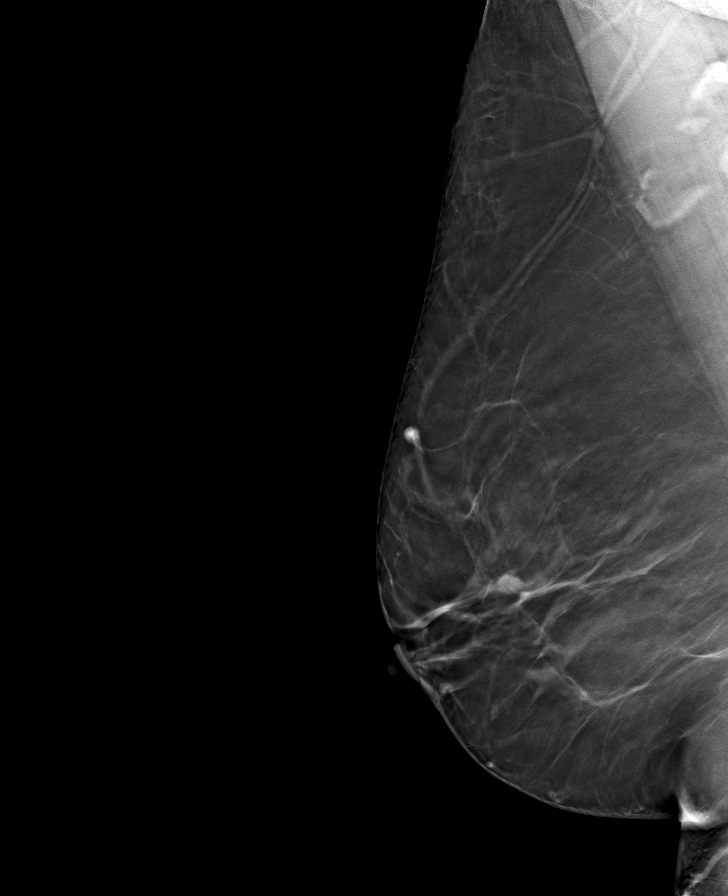

[8 of 24 positions shown; findings below may reference images not displayed]

ACR Breast Density Category b: There are scattered areas of
fibroglandular density.
FINDINGS: There are no findings suspicious for malignancy. Images were
processed with CAD.
IMPRESSION: No mammographic evidence of malignancy. A result letter of this
screening mammogram will be mailed directly to the patient.

RECOMMENDATION:
Screening mammogram in one year. (Code:[id number]-U-775)

BI-RADS CATEGORY  1: Negative.

## 2019-11-22 ENCOUNTER — Telehealth: Payer: Self-pay | Admitting: Internal Medicine

## 2019-11-22 NOTE — Telephone Encounter (Signed)
Caller : [place] Call Back # [phone]  Per [place], her office sent over prior authorization for a study for the pointer study.  Please Advise document received

## 2019-11-26 NOTE — Telephone Encounter (Signed)
Form faxed to [phone]. Form sent for scanning.

## 2019-11-26 NOTE — Telephone Encounter (Signed)
Caller: [place] Call back # [phone]  Wants an update on form

## 2019-11-26 NOTE — Telephone Encounter (Signed)
Form in PCP red folder for review.

## 2019-12-02 ENCOUNTER — Other Ambulatory Visit: Payer: Self-pay | Admitting: Internal Medicine

## 2019-12-03 ENCOUNTER — Encounter: Payer: Self-pay | Admitting: Internal Medicine

## 2019-12-03 MED ORDER — ESCITALOPRAM OXALATE 10 MG PO TABS
10.0000 mg | ORAL_TABLET | Freq: Every day | ORAL | 0 refills | Status: DC
Start: 2019-12-03 — End: 2020-02-04

## 2019-12-04 ENCOUNTER — Encounter: Payer: Self-pay | Admitting: Internal Medicine

## 2020-02-01 ENCOUNTER — Telehealth: Payer: Self-pay | Admitting: Internal Medicine

## 2020-02-04 NOTE — Telephone Encounter (Signed)
PDMP okay, Rx sent 

## 2020-02-04 NOTE — Telephone Encounter (Signed)
Requesting: Ambien 10mg  Contract: 06/13/2018 UDS: None  Last Visit: 08/08/2019 Next Visit: 04/01/2020 Last Refill: 08/08/2019 #90 and 1RF Pt sig: 1 tab qhs prn  Please Advise

## 2020-02-06 ENCOUNTER — Encounter: Payer: Medicare PPO | Admitting: Internal Medicine

## 2020-02-18 DIAGNOSIS — Z20822 Contact with and (suspected) exposure to covid-19: Secondary | ICD-10-CM | POA: Diagnosis not present

## 2020-02-18 DIAGNOSIS — I10 Essential (primary) hypertension: Secondary | ICD-10-CM | POA: Diagnosis not present

## 2020-03-11 DIAGNOSIS — Z124 Encounter for screening for malignant neoplasm of cervix: Secondary | ICD-10-CM | POA: Diagnosis not present

## 2020-03-11 DIAGNOSIS — Z01419 Encounter for gynecological examination (general) (routine) without abnormal findings: Secondary | ICD-10-CM | POA: Diagnosis not present

## 2020-03-11 DIAGNOSIS — B977 Papillomavirus as the cause of diseases classified elsewhere: Secondary | ICD-10-CM | POA: Diagnosis not present

## 2020-03-15 LAB — HM PAP SMEAR: HM Pap smear: NEGATIVE

## 2020-03-20 ENCOUNTER — Encounter: Payer: Self-pay | Admitting: Internal Medicine

## 2020-03-27 ENCOUNTER — Encounter: Payer: Self-pay | Admitting: Internal Medicine

## 2020-04-01 ENCOUNTER — Encounter: Payer: Self-pay | Admitting: Internal Medicine

## 2020-04-01 ENCOUNTER — Ambulatory Visit (INDEPENDENT_AMBULATORY_CARE_PROVIDER_SITE_OTHER): Payer: Medicare PPO | Admitting: Internal Medicine

## 2020-04-01 ENCOUNTER — Other Ambulatory Visit: Payer: Self-pay | Admitting: Internal Medicine

## 2020-04-01 ENCOUNTER — Other Ambulatory Visit: Payer: Self-pay

## 2020-04-01 VITALS — BP 102/69 | HR 87 | Temp 97.9°F | Resp 18 | Ht 68.0 in | Wt 199.5 lb

## 2020-04-01 DIAGNOSIS — R739 Hyperglycemia, unspecified: Secondary | ICD-10-CM | POA: Diagnosis not present

## 2020-04-01 DIAGNOSIS — Z0000 Encounter for general adult medical examination without abnormal findings: Secondary | ICD-10-CM | POA: Diagnosis not present

## 2020-04-01 DIAGNOSIS — I10 Essential (primary) hypertension: Secondary | ICD-10-CM | POA: Diagnosis not present

## 2020-04-01 DIAGNOSIS — R7989 Other specified abnormal findings of blood chemistry: Secondary | ICD-10-CM | POA: Diagnosis not present

## 2020-04-01 NOTE — Assessment & Plan Note (Signed)
Here for CPX HTN: Well-controlled on amlodipine, Lotensin, Aldactone.  Checking labs Anxiety, insomnia: On Ambien, contract signed.  Controlled. Goiter: Ultrasound 08/2019 unchanged Slightly decreased TSH: Check TFTs Osteopenia: Last T score -2.4, encourage appropriate calcium and vitamin D intake, for now not interested on Fosamax, reassess DEXA in 2 to 3 years. Weight gain: We talk about a healthy diet, exercise.  She is a stress eater, some tips provided.   RTC 6 months

## 2020-04-01 NOTE — Progress Notes (Signed)
Pre visit review using our clinic review tool, if applicable. No additional management support is needed unless otherwise documented below in the visit note. 

## 2020-04-01 NOTE — Progress Notes (Signed)
Subjective:    Patient ID: Leah Cameron, female    DOB: [birth year], 69 y.o.   MRN: [id number]  DOS:  04/01/2020 Type of visit - description: CPX In general feeling well. She is concerned about weight gain.  Review of Systems  Other than above, a 14 point review of systems is negative      Past Medical History:  Diagnosis Date  . Allergic rhinitis   . Back pain, chronic   . Fibromyalgia    sees Dr.Davenshwar  . GERD (gastroesophageal reflux disease)   . HPV (human papilloma virus) infection   . Hypertension   . Menopause    on lexapro was started by gyn  . RLS (restless legs syndrome)   . Scoliosis   . Stricture esophagus    last EGD and dilatation 3/09  . Syncope 4/11   admitted thought to be due to over treatment of hypertension  . Thyromegaly   . Vertigo    went to ED on 10/30    Past Surgical History:  Procedure Laterality Date  . BACK SURGERY  09-05-13   cage and rods L4 L5 (@ [facility])  . BIOPSY THYROID  01-2009   negative   . BREAST LUMPECTOMY  2000  . COLONOSCOPY    . KNEE ARTHROSCOPY  2003   right  . KNEE SURGERY  1999/2002   left   . SHOULDER SURGERY  2004  . TONSILLECTOMY    . TRIGGER FINGER RELEASE  08-2014    R THUMB    Allergies as of 04/01/2020      Reactions   Cyclobenzaprine Hcl Other (See Comments)   Face swelling   Chlorzoxazone Rash, Swelling   Hydrocodone-acetaminophen Rash   Penicillins Swelling, Rash   Tolerated cefazolin   Tramadol Hcl Itching, Rash      Medication List       Accurate as of April 01, 2020  9:23 PM. If you have any questions, ask your nurse or doctor.        amLODipine 5 MG tablet Commonly known as: NORVASC TAKE 1 TABLET BY MOUTH EVERY DAY   azelastine 0.1 % nasal spray Commonly known as: ASTELIN Place 2 sprays into both nostrils at bedtime as needed for rhinitis or allergies. Use in each nostril as directed   benazepril 40 MG tablet Commonly known as: LOTENSIN Take 1 tablet (40 mg  total) by mouth daily.   Calcium Carbonate-Vitamin D 600-400 MG-UNIT chew tablet Chew 1 tablet by mouth 2 (two) times daily.   escitalopram 10 MG tablet Commonly known as: LEXAPRO Take 1 tablet (10 mg total) by mouth daily.   fluticasone 50 MCG/ACT nasal spray Commonly known as: FLONASE Place 2 sprays into both nostrils daily.   multivitamin with minerals Tabs tablet Take 1 tablet by mouth daily.   spironolactone 25 MG tablet Commonly known as: ALDACTONE Take 1 tablet (25 mg total) by mouth daily.   VITAMIN C PO Take 1 tablet by mouth daily.   zolpidem 10 MG tablet Commonly known as: AMBIEN TAKE 1 TABLET BY MOUTH AT BEDTIME AS NEEDED FOR SLEEP.          Objective:   Physical Exam BP 102/69 (BP Location: Left Arm, Patient Position: Sitting, Cuff Size: Normal)   Pulse 87   Temp 97.9 F (36.6 C) (Oral)   Resp 18   Ht 5\' 8"  (1.727 m)   Wt 199 lb 8 oz (90.5 kg)   LMP  (LMP Unknown)   SpO2  98%   BMI 30.33 kg/m  General: Well developed, NAD, BMI noted Neck: + Thyromegaly more small to subtle on the left, not tender. HEENT:  Normocephalic . Face symmetric, atraumatic Lungs:  CTA B Normal respiratory effort, no intercostal retractions, no accessory muscle use. Heart: RRR,  no murmur.  Abdomen:  Not distended, soft, non-tender. No rebound or rigidity.   Lower extremities: no pretibial edema bilaterally  Skin: Exposed areas without rash. Not pale. Not jaundice Neurologic:  alert & oriented X3.  Speech normal, gait appropriate for age and unassisted Strength symmetric and appropriate for age.  Psych: Cognition and judgment appear intact.  Cooperative with normal attention span and concentration.  Behavior appropriate. No anxious or depressed appearing.     Assessment     Assessment HTN --hypokalemia, ?primary hyperaldosteronism Anxiety, insomnia --- start to rx ambien here ~ 04-2015 (previously by rheumatology) Multinodular goiter, Dr. Dwyane Dee, seen [phone]   ---01-2009 thyroid Bx (-), BX again 11-2014.  [place] 08/2019: Unchanged  GI: --GERD  --esophageal stricture, dilatation  2009 MSK: --fibromyalgia >>> stop seen Dr Herold Harms by the end of 2016 b/c she felt  better  --Chronic back pain -- resolved after 2015 back surgery --RLS -- Osteopenia: DEXA  11/28/13 Tscore -2.7; DEXA 09/09/16 T score -2.4 Syncope 2011 (? due to over rx of HTN) Self dc ASA 2016 d/t easy bruising   PLAN:   Here for CPX HTN: Well-controlled on amlodipine, Lotensin, Aldactone.  Checking labs Anxiety, insomnia: On Ambien, contract signed.  Controlled. Goiter: Ultrasound 08/2019 unchanged Slightly decreased TSH: Check TFTs Osteopenia: Last T score -2.4, encourage appropriate calcium and vitamin D intake, for now not interested on Fosamax, reassess DEXA in 2 to 3 years. Weight gain: We talk about a healthy diet, exercise.  She is a stress eater, some tips provided.   RTC 6 months   This visit occurred during the SARS-CoV-2 public health emergency.  Safety protocols were in place, including screening questions prior to the visit, additional usage of staff PPE, and extensive cleaning of exam room while observing appropriate contact time as indicated for disinfecting solutions.

## 2020-04-01 NOTE — Assessment & Plan Note (Signed)
--  Td 2019; pnm 13: 2018; pnm 23: 06/2018;  Zostavax: 2015.  Shingrix discussed before -Had COVID vaccine x3 -Had a flu shot  -- Female care: Saw gynecology 03/11/2020.  MMG 09/2019 ([facility] PN) -- CCS: Colonoscopy:  01/10/2002 and 10- 2013, neg, 10 years  -- Labs : CMP, FLP, CBC, A1c, TSH, T3, T4 -- Diet and exercise discussed

## 2020-04-01 NOTE — Patient Instructions (Addendum)
Check the  blood pressure regularly. BP GOAL is between 110/65 and  135/85. If it is consistently higher or lower, let me know  Consider get the shingles vaccination (Shingrix) at your pharmacy   [place] [facility] : Get the blood work     [place], [place] back for a checkup in 6 months     LEARN ABOUT CALCIUM AND VITAMIN D  Calcium and Vitamin D are important to help keep your bones healthy and prevent osteoporosis. Healthy calcium and Vitamin D levels can be reached by increasing calcium and vitamin D in your diet, or by taking supplements over the counter.  The current recommendations are as follows:  Increasing calcium and vitamin D is generally well tolerated in most people. Some people may get constipated (with CALCIUM).   RECCOMENDATIONS -Postmenopausal women:    1200mg  calcium and 800 IU (20 micrograms) vitamin D daily   Below are some examples of foods high in calcium and vitamin D:  Foods with Vitamin D FOOD Vitamin D Amount  3 oz Salmon 380-570 IU  8 oz Milk (nonfat, reduced fat, and whole) 100 IU  6 oz Yogurt, fortified with vitamin D 80 IU  8 oz Orange Juice, fortified with vitamin D 100 IU  1 Egg 25 IU   Foods with Calcium FOOD Calcium Amount  1 cup of milk 300 mg  1 cup of yogurt 450 mg  1 oz cheese 200 mg  1 cup of spinach 240 mg  8 oz orange juice, fortified with calcium 300 mg

## 2020-04-02 LAB — COMPREHENSIVE METABOLIC PANEL
ALT: 22 U/L (ref 0–35)
AST: 20 U/L (ref 0–37)
Albumin: 4.6 g/dL (ref 3.5–5.2)
Alkaline Phosphatase: 79 U/L (ref 39–117)
BUN: 20 mg/dL (ref 6–23)
CO2: 30 mEq/L (ref 19–32)
Calcium: 10 mg/dL (ref 8.4–10.5)
Chloride: 101 mEq/L (ref 96–112)
Creatinine, Ser: 1.15 mg/dL (ref 0.40–1.20)
GFR: 48.92 mL/min — ABNORMAL LOW (ref >60.00)
Glucose, Bld: 128 mg/dL — ABNORMAL HIGH (ref 70–99)
Potassium: 4 mEq/L (ref 3.5–5.1)
Sodium: 137 mEq/L (ref 135–145)
Total Bilirubin: 0.5 mg/dL (ref 0.2–1.2)
Total Protein: 7.3 g/dL (ref 6.0–8.3)

## 2020-04-02 LAB — CBC WITH DIFFERENTIAL/PLATELET
Basophils Absolute: 0.1 10*3/uL (ref 0.0–0.1)
Basophils Relative: 1.6 % (ref 0.0–3.0)
Eosinophils Absolute: 0.2 10*3/uL (ref 0.0–0.7)
Eosinophils Relative: 2.7 % (ref 0.0–5.0)
HCT: 38.2 % (ref 36.0–46.0)
Hemoglobin: 12.2 g/dL (ref 12.0–15.0)
Lymphocytes Relative: 30.6 % (ref 12.0–46.0)
Lymphs Abs: 1.7 10*3/uL (ref 0.7–4.0)
MCHC: 31.8 g/dL (ref 30.0–36.0)
MCV: 83.5 fl (ref 78.0–100.0)
Monocytes Absolute: 0.3 10*3/uL (ref 0.1–1.0)
Monocytes Relative: 6.1 % (ref 3.0–12.0)
Neutro Abs: 3.3 10*3/uL (ref 1.4–7.7)
Neutrophils Relative %: 59 % (ref 43.0–77.0)
Platelets: 137 10*3/uL — ABNORMAL LOW (ref 150.0–400.0)
RBC: 4.58 Mil/uL (ref 3.87–5.11)
RDW: 14.6 % (ref 11.5–15.5)
WBC: 5.6 10*3/uL (ref 4.0–10.5)

## 2020-04-02 LAB — LIPID PANEL
Cholesterol: 165 mg/dL (ref 0–200)
HDL: 40.4 mg/dL (ref >39.00)
LDL Cholesterol: 90 mg/dL (ref 0–99)
NonHDL: 124.76
Total CHOL/HDL Ratio: 4
Triglycerides: 175 mg/dL — ABNORMAL HIGH (ref 0.0–149.0)
VLDL: 35 mg/dL (ref 0.0–40.0)

## 2020-04-02 LAB — HEMOGLOBIN A1C: Hgb A1c MFr Bld: 6.3 % (ref 4.6–6.5)

## 2020-04-02 LAB — T4, FREE: Free T4: 0.7 ng/dL (ref 0.60–1.60)

## 2020-04-02 LAB — T3, FREE: T3, Free: 3.7 pg/mL (ref 2.3–4.2)

## 2020-04-02 LAB — TSH: TSH: 0.38 u[IU]/mL (ref 0.35–4.50)

## 2020-05-24 ENCOUNTER — Other Ambulatory Visit: Payer: Self-pay | Admitting: Internal Medicine

## 2020-07-10 LAB — LIPID PANEL
Cholesterol: 160 (ref 0–200)
HDL: 39 (ref 35–70)
LDL Cholesterol: 99
Triglycerides: 121 (ref 40–160)

## 2020-07-10 LAB — HEMOGLOBIN A1C: Hemoglobin A1C: 6.1

## 2020-07-31 ENCOUNTER — Telehealth: Payer: Self-pay | Admitting: Internal Medicine

## 2020-07-31 NOTE — Telephone Encounter (Signed)
Requesting: Ambien 10mg   Contract: 04/01/2020 UDS: None Last Visit: 04/01/2020 Next Visit:  09/30/2020 Last Refill: 02/04/2020 #90 and 1RF  Please Advise

## 2020-07-31 NOTE — Telephone Encounter (Signed)
PDMP okay, Rx sent 

## 2020-08-01 ENCOUNTER — Encounter: Payer: Self-pay | Admitting: Internal Medicine

## 2020-08-15 ENCOUNTER — Encounter: Payer: Self-pay | Admitting: Family

## 2020-08-15 ENCOUNTER — Other Ambulatory Visit: Payer: Self-pay

## 2020-08-15 ENCOUNTER — Ambulatory Visit (INDEPENDENT_AMBULATORY_CARE_PROVIDER_SITE_OTHER): Payer: Medicare PPO | Admitting: Family

## 2020-08-15 VITALS — BP 133/81 | HR 65 | Temp 98.6°F | Resp 16 | Ht 68.0 in | Wt 187.0 lb

## 2020-08-15 DIAGNOSIS — M7918 Myalgia, other site: Secondary | ICD-10-CM | POA: Diagnosis not present

## 2020-08-15 DIAGNOSIS — J320 Chronic maxillary sinusitis: Secondary | ICD-10-CM | POA: Diagnosis not present

## 2020-08-15 MED ORDER — AZITHROMYCIN 250 MG PO TABS: ORAL_TABLET | ORAL | 0 refills | Status: AC

## 2020-08-15 MED ORDER — MELOXICAM 7.5 MG PO TABS: 7.5000 mg | ORAL_TABLET | Freq: Every day | ORAL | 0 refills | Status: DC

## 2020-08-15 NOTE — Patient Instructions (Signed)
Please begin azithromycin for sinus infection. For your left shoulder pain you can begin meloxicam 7.5mg  once daily as needed. If symptoms fail to improve in 1 week, please call your orthopedic doctor.

## 2020-08-15 NOTE — Assessment & Plan Note (Signed)
New. Will rx with azithromycin. She reports tongue/lip swelling with amoxicillin. She will continue OTC antihistamines and is advised to call if symptoms worsen or if not improved in 1 week.

## 2020-08-15 NOTE — Assessment & Plan Note (Signed)
Trial of meloxicam 7.5mg  once daily.  I gave her a 14 day supply. She has an orthopedist she plans to call if symptoms do not improve.

## 2020-08-15 NOTE — Progress Notes (Signed)
Subjective:   By signing my name below, I, Shehryar Baig, attest that this documentation has been prepared under the direction and in the presence of Debbrah Alar NP. 08/15/2020      Patient ID: Leah Cameron, female    DOB: [birth year], 70 y.o.   MRN: [id number]  No chief complaint on file.   HPI    Patient is in today for an office visit.   Sinus congestion- She is complaining of sinus pain. She also notes having yellow nasal discharge, yellow sputum production when she coughs, chills and fatigue. Symptoms began 2 weeks ago. She has taken allergy medication to manage these symptoms but has found only mild relief.  Left shoulder pain- She is also complaining of soreness in her left shoulder. She denies having any fever at this time. She has a enlarged thyroid.   Past Medical History:  Diagnosis Date  . Allergic rhinitis   . Back pain, chronic   . Fibromyalgia    sees Dr.Davenshwar  . GERD (gastroesophageal reflux disease)   . HPV (human papilloma virus) infection   . Hypertension   . Menopause    on lexapro was started by gyn  . RLS (restless legs syndrome)   . Scoliosis   . Stricture esophagus    last EGD and dilatation 3/09  . Syncope 4/11   admitted thought to be due to over treatment of hypertension  . Thyromegaly   . Vertigo    went to ED on 10/30    Past Surgical History:  Procedure Laterality Date  . BACK SURGERY  09-05-13   cage and rods L4 L5 (@ [facility])  . BIOPSY THYROID  01-2009   negative   . BREAST LUMPECTOMY  2000  . COLONOSCOPY    . KNEE ARTHROSCOPY  2003   right  . KNEE SURGERY  1999/2002   left   . SHOULDER SURGERY  2004  . TONSILLECTOMY    . TRIGGER FINGER RELEASE  08-2014    R THUMB    Family History  Problem Relation Age of Onset  . Stroke Mother        M, F, 1/2 sister, sister   . Thyroid disease Mother   . Breast cancer Other        1/2 sister  . Diabetes Other        aunt  . Coronary artery disease Other         GF late in life  . Lung cancer Other        cousin  . Stroke Sister   . Dementia Sister   . Colon cancer Neg Hx     Social History   Socioeconomic History  . Marital status: Married    Spouse name: Not on file  . Number of children: 2  . Years of education: Not on file  . Highest education level: Not on file  Occupational History  . Occupation:  Retired 2001, Public relations account executive, disable    Tobacco Use  . Smoking status: Never Smoker  . Smokeless tobacco: Never Used  Substance and Sexual Activity  . Alcohol use: Yes    Comment: socially   . Drug use: No  . Sexual activity: Not on file  Other Topics Concern  . Not on file  Social History Narrative   Retired    [place] a g-child on-off         Social Determinants of Radio broadcast assistant Strain:  Not on file  Food Insecurity: Not on file  Transportation Needs: Not on file  Physical Activity: Not on file  Stress: Not on file  Social Connections: Not on file  Intimate Partner Violence: Not on file    Outpatient Medications Prior to Visit  Medication Sig Dispense Refill  . amLODipine (NORVASC) 5 MG tablet Take 1 tablet (5 mg total) by mouth daily. 90 tablet 1  . Ascorbic Acid (VITAMIN C PO) Take 1 tablet by mouth daily.    Marland Kitchen azelastine (ASTELIN) 0.1 % nasal spray Place 2 sprays into both nostrils at bedtime as needed for rhinitis or allergies. Use in each nostril as directed 30 mL 0  . benazepril (LOTENSIN) 40 MG tablet TAKE 1 TABLET BY MOUTH EVERY DAY 90 tablet 1  . Calcium Carbonate-Vitamin D 600-400 MG-UNIT chew tablet Chew 1 tablet by mouth 2 (two) times daily.     Marland Kitchen escitalopram (LEXAPRO) 10 MG tablet Take 1 tablet (10 mg total) by mouth daily. 90 tablet 1  . fluticasone (FLONASE) 50 MCG/ACT nasal spray Place 2 sprays into both nostrils daily. 16 g 6  . Multiple Vitamin (MULTIVITAMIN WITH MINERALS) TABS Take 1 tablet by mouth daily.    Marland Kitchen spironolactone (ALDACTONE) 25 MG tablet Take 1 tablet (25  mg total) by mouth daily. 90 tablet 1  . zolpidem (AMBIEN) 10 MG tablet Take 1 tablet (10 mg total) by mouth at bedtime as needed for sleep. 90 tablet 1   No facility-administered medications prior to visit.    Allergies  Allergen Reactions  . Cyclobenzaprine Hcl Other (See Comments)    Face swelling  . Chlorzoxazone Rash and Swelling  . Hydrocodone-Acetaminophen Rash  . Penicillins Swelling and Rash    Tolerated cefazolin  . Tramadol Hcl Itching and Rash    Review of Systems  Constitutional: Negative for fever.  HENT: Positive for sinus pain.        (+)Yellow nasal drainage.  Respiratory: Positive for sputum production (Yellow sputum production).   Musculoskeletal: Positive for myalgias (Left shoulder).       Objective:    Physical Exam Constitutional:      Appearance: Normal appearance.  HENT:     Head: Normocephalic and atraumatic.     Comments: Left maxillary tenderness.    Right Ear: External ear normal.     Left Ear: External ear normal.  Eyes:     Extraocular Movements: Extraocular movements intact.     Pupils: Pupils are equal, round, and reactive to light.  Cardiovascular:     Rate and Rhythm: Normal rate and regular rhythm.     Pulses: Normal pulses.     Heart sounds: Normal heart sounds.  Pulmonary:     Effort: Pulmonary effort is normal. No respiratory distress.     Breath sounds: Normal breath sounds. No stridor. No wheezing, rhonchi or rales.  Musculoskeletal:     Comments: Left anterior shoulder tenderness to palpation  Lymphadenopathy:     Comments: Thyromegaly L>R  Skin:    General: Skin is warm and dry.  Neurological:     Mental Status: She is alert and oriented to person, place, and time.  Psychiatric:        Behavior: Behavior normal.     LMP  (LMP Unknown)  Wt Readings from Last 3 Encounters:  04/01/20 199 lb 8 oz (90.5 kg)  08/08/19 188 lb 6 oz (85.4 kg)  06/13/18 193 lb 2 oz (87.6 kg)    Diabetic Foot Exam -  Simple   No data  filed    Lab Results  Component Value Date   WBC 5.6 04/01/2020   HGB 12.2 04/01/2020   HCT 38.2 04/01/2020   PLT 137.0 (L) 04/01/2020   GLUCOSE 128 (H) 04/01/2020   CHOL 160 07/10/2020   TRIG 121 07/10/2020   HDL 39 07/10/2020   LDLCALC 99 07/10/2020   ALT 22 04/01/2020   AST 20 04/01/2020   NA 137 04/01/2020   K 4.0 04/01/2020   CL 101 04/01/2020   CREATININE 1.15 04/01/2020   BUN 20 04/01/2020   CO2 30 04/01/2020   TSH 0.38 04/01/2020   HGBA1C 6.1 07/10/2020    Lab Results  Component Value Date   TSH 0.38 04/01/2020   Lab Results  Component Value Date   WBC 5.6 04/01/2020   HGB 12.2 04/01/2020   HCT 38.2 04/01/2020   MCV 83.5 04/01/2020   PLT 137.0 (L) 04/01/2020   Lab Results  Component Value Date   NA 137 04/01/2020   K 4.0 04/01/2020   CO2 30 04/01/2020   GLUCOSE 128 (H) 04/01/2020   BUN 20 04/01/2020   CREATININE 1.15 04/01/2020   BILITOT 0.5 04/01/2020   ALKPHOS 79 04/01/2020   AST 20 04/01/2020   ALT 22 04/01/2020   PROT 7.3 04/01/2020   ALBUMIN 4.6 04/01/2020   CALCIUM 10.0 04/01/2020   GFR 48.92 (L) 04/01/2020   Lab Results  Component Value Date   CHOL 160 07/10/2020   Lab Results  Component Value Date   HDL 39 07/10/2020   Lab Results  Component Value Date   LDLCALC 99 07/10/2020   Lab Results  Component Value Date   TRIG 121 07/10/2020   Lab Results  Component Value Date   CHOLHDL 4 04/01/2020   Lab Results  Component Value Date   HGBA1C 6.1 07/10/2020       Assessment & Plan:   Problem List Items Addressed This Visit   None       No orders of the defined types were placed in this encounter.   I, Debbrah Alar NP, personally preformed the services described in this documentation.  All medical record entries made by the scribe were at my direction and in my presence.  I have reviewed the chart and discharge instructions (if applicable) and agree that the record reflects my personal performance and is  accurate and complete. 08/15/2020   I,Shehryar Baig,acting as a Education administrator for Nance Pear, NP.,have documented all relevant documentation on the behalf of Nance Pear, NP,as directed by  Nance Pear, NP while in the presence of Nance Pear, NP.   Shehryar [employer]

## 2020-08-28 ENCOUNTER — Ambulatory Visit (INDEPENDENT_AMBULATORY_CARE_PROVIDER_SITE_OTHER): Payer: Medicare PPO

## 2020-08-28 ENCOUNTER — Other Ambulatory Visit: Payer: Self-pay

## 2020-08-28 ENCOUNTER — Encounter: Payer: Self-pay | Admitting: Podiatry

## 2020-08-28 ENCOUNTER — Ambulatory Visit: Payer: Medicare PPO | Admitting: Podiatry

## 2020-08-28 DIAGNOSIS — M79672 Pain in left foot: Secondary | ICD-10-CM | POA: Diagnosis not present

## 2020-08-28 DIAGNOSIS — M778 Other enthesopathies, not elsewhere classified: Secondary | ICD-10-CM | POA: Diagnosis not present

## 2020-08-28 DIAGNOSIS — M205X1 Other deformities of toe(s) (acquired), right foot: Secondary | ICD-10-CM | POA: Diagnosis not present

## 2020-08-28 DIAGNOSIS — M79671 Pain in right foot: Secondary | ICD-10-CM

## 2020-08-28 MED ORDER — DICLOFENAC SODIUM 75 MG PO TBEC: 75.0000 mg | DELAYED_RELEASE_TABLET | Freq: Two times a day (BID) | ORAL | 2 refills | Status: DC

## 2020-08-28 MED ORDER — TRIAMCINOLONE ACETONIDE 10 MG/ML IJ SUSP
10.0000 mg | Freq: Once | INTRAMUSCULAR | Status: AC
  Administered 2020-08-28: 10 mg

## 2020-08-28 NOTE — Progress Notes (Signed)
Subjective:   Patient ID: Leah Cameron, female   DOB: 70 y.o.   MRN: [id number]   HPI Patient presents stating she has a lot of pain in the big toe joint of her right foot and states its been sore and making it hard to walk.  States she does not remember specifics but its been around 6 months that its been hurting and she did have bunion surgery around 20 years ago.  Patient does not smoke likes to be active   Review of Systems  All other systems reviewed and are negative.       Objective:  Physical Exam Vitals and nursing note reviewed.  Constitutional:      Appearance: She is well-developed.  Pulmonary:     Effort: Pulmonary effort is normal.  Musculoskeletal:        General: Normal range of motion.  Skin:    General: Skin is warm.  Neurological:     Mental Status: She is alert.     Neurovascular status intact muscle strength found to be adequate range of motion adequate with patient found to have diminished range of motion first MPJ right with inflammation fluid around the joint surface with previous surgery for correction of bunion deformity.  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  I will get a try conservative and she will utilize shoe gear modifications and I did sterile prep and injected the first MPJ periarticular 3 mg Kenalog 5 mg Xylocaine advised on reduced activity.  Reappoint to recheck  X-rays indicate on the right significant narrowing of the joint surface pins intact from previous surgery 20 years ago

## 2020-09-06 ENCOUNTER — Other Ambulatory Visit: Payer: Self-pay | Admitting: Internal Medicine

## 2020-09-10 ENCOUNTER — Encounter: Payer: Self-pay | Admitting: Family

## 2020-09-10 ENCOUNTER — Other Ambulatory Visit: Payer: Self-pay | Admitting: Internal Medicine

## 2020-09-10 MED ORDER — AZELASTINE HCL 0.1 % NA SOLN: 2.0000 | Freq: Two times a day (BID) | NASAL | 6 refills | Status: DC

## 2020-09-10 NOTE — Telephone Encounter (Signed)
She needs a follow up visit please.

## 2020-09-17 DIAGNOSIS — H40023 Open angle with borderline findings, high risk, bilateral: Secondary | ICD-10-CM | POA: Diagnosis not present

## 2020-09-17 DIAGNOSIS — H2513 Age-related nuclear cataract, bilateral: Secondary | ICD-10-CM | POA: Diagnosis not present

## 2020-09-17 DIAGNOSIS — H5213 Myopia, bilateral: Secondary | ICD-10-CM | POA: Diagnosis not present

## 2020-09-17 DIAGNOSIS — H35372 Puckering of macula, left eye: Secondary | ICD-10-CM | POA: Diagnosis not present

## 2020-09-25 ENCOUNTER — Ambulatory Visit: Payer: Medicare PPO | Admitting: Podiatry

## 2020-09-30 ENCOUNTER — Other Ambulatory Visit: Payer: Self-pay

## 2020-09-30 ENCOUNTER — Encounter: Payer: Self-pay | Admitting: Internal Medicine

## 2020-09-30 ENCOUNTER — Ambulatory Visit: Payer: Medicare PPO | Admitting: Internal Medicine

## 2020-09-30 VITALS — BP 144/80 | HR 81 | Temp 99.6°F | Resp 16 | Ht 68.0 in | Wt 186.2 lb

## 2020-09-30 DIAGNOSIS — Z1231 Encounter for screening mammogram for malignant neoplasm of breast: Secondary | ICD-10-CM | POA: Diagnosis not present

## 2020-09-30 DIAGNOSIS — I10 Essential (primary) hypertension: Secondary | ICD-10-CM | POA: Diagnosis not present

## 2020-09-30 DIAGNOSIS — U071 COVID-19: Secondary | ICD-10-CM | POA: Diagnosis not present

## 2020-09-30 DIAGNOSIS — D696 Thrombocytopenia, unspecified: Secondary | ICD-10-CM | POA: Diagnosis not present

## 2020-09-30 NOTE — Progress Notes (Signed)
Subjective:    Patient ID: Leah Cameron, female    DOB: 24-Mar-1951, 70 y.o.   MRN: [id number]  DOS:  09/30/2020 Type of visit - description: ROV   Today we talk about high blood pressure and cholesterol. Also, she was seen last month with sinusitis, treated with Zithromax and got better however for the last 2 days she is having respiratory symptoms again: Nasal discharge, scratchy throat.  Some chest congestion and cough. Minimal sputum production. Husband has similar symptoms, was diagnosed yesterday with sinusitis via a televisit.  Review of Systems No nausea or vomiting No unusual myalgias No fever chills  Past Medical History:  Diagnosis Date   Allergic rhinitis    Back pain, chronic    Fibromyalgia    sees Dr.Davenshwar   GERD (gastroesophageal reflux disease)    HPV (human papilloma virus) infection    Hypertension    Menopause    on lexapro was started by gyn   RLS (restless legs syndrome)    Scoliosis    Stricture esophagus    last EGD and dilatation 3/09   Syncope 4/11   admitted thought to be due to over treatment of hypertension   Thyromegaly    Vertigo    went to ED on 10/30    Past Surgical History:  Procedure Laterality Date   BACK SURGERY  09-05-13   cage and rods L4 L5 (@ [facility])   BIOPSY THYROID  01-2009   negative    BREAST LUMPECTOMY  2000   COLONOSCOPY     KNEE ARTHROSCOPY  2003   right   KNEE SURGERY  1999/2002   left    SHOULDER SURGERY  2004   TONSILLECTOMY     TRIGGER FINGER RELEASE  08-2014    R THUMB    Allergies as of 09/30/2020       Reactions   Cyclobenzaprine Hcl Other (See Comments)   Face swelling   Chlorzoxazone Rash, Swelling   Hydrocodone-acetaminophen Rash   Penicillins Swelling, Rash   Tolerated cefazolin   Tramadol Hcl Itching, Rash        Medication List        Accurate as of September 30, 2020 11:59 PM. If you have any questions, ask your nurse or doctor.          STOP taking these  medications    diclofenac 75 MG EC tablet Commonly known as: VOLTAREN Stopped by: Kathlene November, MD   meloxicam 7.5 MG tablet Commonly known as: MOBIC Stopped by: Kathlene November, MD       TAKE these medications    amLODipine 5 MG tablet Commonly known as: NORVASC Take 1 tablet (5 mg total) by mouth daily.   azelastine 0.1 % nasal spray Commonly known as: ASTELIN Place 2 sprays into both nostrils at bedtime as needed for rhinitis or allergies. Use in each nostril as directed   azelastine 0.1 % nasal spray Commonly known as: ASTELIN Place 2 sprays into both nostrils 2 (two) times daily.   benazepril 40 MG tablet Commonly known as: LOTENSIN Take 1 tablet (40 mg total) by mouth daily.   Calcium Carbonate-Vitamin D 600-400 MG-UNIT chew tablet Chew 1 tablet by mouth 2 (two) times daily.   escitalopram 10 MG tablet Commonly known as: LEXAPRO Take 1 tablet (10 mg total) by mouth daily.   fluticasone 50 MCG/ACT nasal spray Commonly known as: FLONASE Place 2 sprays into both nostrils daily.   multivitamin with minerals Tabs tablet Take 1  tablet by mouth daily.   spironolactone 25 MG tablet Commonly known as: ALDACTONE Take 1 tablet (25 mg total) by mouth daily.   VITAMIN C PO Take 1 tablet by mouth daily.   zolpidem 10 MG tablet Commonly known as: AMBIEN Take 1 tablet (10 mg total) by mouth at bedtime as needed for sleep.           Objective:   Physical Exam BP (!) 144/80 (BP Location: Left Arm, Patient Position: Sitting, Cuff Size: Small)   Pulse 81   Temp 99.6 F (37.6 C) (Oral)   Resp 16   Ht 5\' 8"  (1.727 m)   Wt 186 lb 4 oz (84.5 kg)   LMP  (LMP Unknown)   SpO2 98%   BMI 28.32 kg/m  General:   Well developed, NAD, BMI noted. HEENT:  Normocephalic . Face symmetric, atraumatic. TMs partially visualized due to wax but they look normal. Nose is slightly congested, sinuses non-TTP.  Throat symmetric, no red, no white patches. Lungs:  CTA B Normal  respiratory effort, no intercostal retractions, no accessory muscle use. Heart: RRR,  no murmur.  Lower extremities: no pretibial edema bilaterally  Skin: Not pale. Not jaundice Neurologic:  alert & oriented X3.  Speech normal, gait appropriate for age and unassisted Psych--  Cognition and judgment appear intact.  Cooperative with normal attention span and concentration.  Behavior appropriate. No anxious or depressed appearing.      Assessment     Assessment HTN --hypokalemia, ?primary hyperaldosteronism Anxiety, insomnia --- start to rx ambien here ~ 04-2015 (previously by rheumatology) Multinodular goiter, Dr. Dwyane Dee, seen [phone]  ---01-2009 thyroid Bx (-), BX again 11-2014.  [place] 08/2019: Unchanged  GI: --GERD  --esophageal stricture, dilatation  2009 MSK: --fibromyalgia >>> stop seen Dr Herold Harms by the end of 2016 b/c she felt  better  --Chronic back pain -- resolved after 2015 back surgery --RLS -- Osteopenia: DEXA  11/28/13 Tscore -2.7; DEXA 09/09/16 T score -2.4 Syncope 2011 (? due to over rx of HTN) Self dc ASA 2016 d/t easy bruising   PLAN:   URI: As described above, recommend: Continue with conservative treatment, check for COVID and let me know the results. HTN: At home ambulatory BPs are in the 126/80s.  Occasionally DBP in the 90s. Plan: Continue amlodipine, Aldactone, benazepril.  Check BMP and CBC. Mild thrombocytopenia: Checking a CBC Preventive care: Wants to feel better from a URI and if the COVID test is negative, proceed with COVID-vaccine #4 at her convenience Addendum: COVID test at home +, treated with paxlovid RTC December, CPX   This visit occurred during the SARS-CoV-2 public health emergency.  Safety protocols were in place, including screening questions prior to the visit, additional usage of staff PPE, and extensive cleaning of exam room while observing appropriate contact time as indicated for disinfecting solutions.

## 2020-09-30 NOTE — Patient Instructions (Addendum)
Your due for your yearly mammogram, we have placed an order for the [facility], please expect a call in the next several days to schedule an appointment.    Continue Mucinex, Benadryl and then also spray Please recheck for COVID at your earliest convenience and let me know if the test came back positive for  If your symptoms are not gradually improving please let me know  Check your blood pressure regularly BP GOAL is between 110/65 and  135/85. If it is consistently higher or lower, let me know      GO TO THE LAB : Get the blood work     [place], [place] back for  a physical exam by 03-2021

## 2020-10-01 ENCOUNTER — Telehealth: Payer: Self-pay

## 2020-10-01 LAB — BASIC METABOLIC PANEL
BUN: 20 mg/dL (ref 6–23)
CO2: 31 mEq/L (ref 19–32)
Calcium: 9.9 mg/dL (ref 8.4–10.5)
Chloride: 99 mEq/L (ref 96–112)
Creatinine, Ser: 1.08 mg/dL (ref 0.40–1.20)
GFR: 52.57 mL/min — ABNORMAL LOW (ref >60.00)
Glucose, Bld: 80 mg/dL (ref 70–99)
Potassium: 4.2 mEq/L (ref 3.5–5.1)
Sodium: 136 mEq/L (ref 135–145)

## 2020-10-01 LAB — CBC WITH DIFFERENTIAL/PLATELET
Basophils Absolute: 0 10*3/uL (ref 0.0–0.1)
Basophils Relative: 0.4 % (ref 0.0–3.0)
Eosinophils Absolute: 0.1 10*3/uL (ref 0.0–0.7)
Eosinophils Relative: 2.4 % (ref 0.0–5.0)
HCT: 37.4 % (ref 36.0–46.0)
Hemoglobin: 12 g/dL (ref 12.0–15.0)
Lymphocytes Relative: 17.2 % (ref 12.0–46.0)
Lymphs Abs: 1 10*3/uL (ref 0.7–4.0)
MCHC: 32.1 g/dL (ref 30.0–36.0)
MCV: 82.9 fl (ref 78.0–100.0)
Monocytes Absolute: 0.6 10*3/uL (ref 0.1–1.0)
Monocytes Relative: 11.1 % (ref 3.0–12.0)
Neutro Abs: 3.9 10*3/uL (ref 1.4–7.7)
Neutrophils Relative %: 68.9 % (ref 43.0–77.0)
Platelets: 120 10*3/uL — ABNORMAL LOW (ref 150.0–400.0)
RBC: 4.51 Mil/uL (ref 3.87–5.11)
RDW: 14.8 % (ref 11.5–15.5)
WBC: 5.6 10*3/uL (ref 4.0–10.5)

## 2020-10-01 MED ORDER — NIRMATRELVIR/RITONAVIR (PAXLOVID)TABLET: 3.0000 | ORAL_TABLET | Freq: Two times a day (BID) | ORAL | 0 refills | Status: AC

## 2020-10-01 NOTE — Telephone Encounter (Signed)
Will reach out to lab to see if they can go ahead and run her blood work from yesterday.

## 2020-10-01 NOTE — Telephone Encounter (Signed)
Spoke w/ Pt- informed of recommendations. Paxlovid sent to [facility].

## 2020-10-01 NOTE — Telephone Encounter (Signed)
Home rapid test positive for COVID.

## 2020-10-01 NOTE — Telephone Encounter (Signed)
Patient was seen yesterday, symptoms a started 3 days ago, BMP was ordered results pending. Will recommend medication once we have the creatinine back. She could be a good candidate for paxlovid or molnupiravir. Needs to quarantine x 8 additional days. ER if sxs severe

## 2020-10-01 NOTE — Assessment & Plan Note (Signed)
URI: As described above, recommend: Continue with conservative treatment, check for COVID and let me know the results. HTN: At home ambulatory BPs are in the 126/80s.  Occasionally DBP in the 90s. Plan: Continue amlodipine, Aldactone, benazepril.  Check BMP and CBC. Mild thrombocytopenia: Checking a CBC Preventive care: Wants to feel better from a URI and if the COVID test is negative, proceed with COVID-vaccine #4 at her convenience Addendum: COVID test at home +, treated with paxlovid RTC December, CPX

## 2020-10-01 NOTE — Telephone Encounter (Signed)
GFR calculated: 61. Advised patient about my recommendations Send Paxlovid  Hold amlodipine, if other interactions pop up let me know Also, her husband is my patient, if he has been sick for more than 7 days he will not qualify for treatment but I would be happy to see him today if needed.

## 2020-10-01 NOTE — Telephone Encounter (Signed)
Labs are back, GFR slightly low, please advise medication therapy and I will call her.

## 2020-10-01 NOTE — Telephone Encounter (Signed)
See telephone note.

## 2020-10-08 ENCOUNTER — Encounter: Payer: Self-pay | Admitting: Internal Medicine

## 2020-11-12 ENCOUNTER — Other Ambulatory Visit: Payer: Self-pay

## 2020-11-12 ENCOUNTER — Ambulatory Visit
Admission: RE | Admit: 2020-11-12 | Discharge: 2020-11-12 | Disposition: A | Discharge status: Home / Self Care | Payer: Medicare PPO | Source: Ambulatory Visit | Attending: Internal Medicine | Admitting: Internal Medicine

## 2020-11-12 DIAGNOSIS — Z1231 Encounter for screening mammogram for malignant neoplasm of breast: Secondary | ICD-10-CM | POA: Diagnosis not present

## 2020-12-03 ENCOUNTER — Telehealth: Payer: Self-pay | Admitting: Internal Medicine

## 2020-12-03 NOTE — Telephone Encounter (Signed)
Left message for patient to call back and schedule Medicare Annual Wellness Visit (AWV) in office.   If not able to come in office, please offer to do virtually or by telephone.  Left office number and my jabber #[phone].  Due for AWVI  Please schedule at anytime with Nurse Health Advisor.   

## 2021-01-14 LAB — LIPID PANEL
Cholesterol: 179 (ref 0–200)
HDL: 48 (ref 35–70)
LDL Cholesterol: 108
Triglycerides: 130 (ref 40–160)

## 2021-01-14 LAB — BASIC METABOLIC PANEL: Glucose: 91

## 2021-01-14 LAB — HEMOGLOBIN A1C: Hemoglobin A1C: 6

## 2021-01-27 ENCOUNTER — Encounter: Payer: Self-pay | Admitting: Internal Medicine

## 2021-01-27 ENCOUNTER — Telehealth: Payer: Self-pay | Admitting: Internal Medicine

## 2021-01-27 NOTE — Telephone Encounter (Signed)
Requesting: Ambien 10mg  Contract: 04/01/2020 UDS: None Last Visit: 09/30/2020 Next Visit: 04/01/2021 Last Refill: 07/31/2020 #90 and 1RF  Please Advise

## 2021-01-27 NOTE — Telephone Encounter (Signed)
PDMP okay, Rx sent 

## 2021-02-05 ENCOUNTER — Encounter: Payer: Self-pay | Admitting: Internal Medicine

## 2021-03-17 DIAGNOSIS — M8580 Other specified disorders of bone density and structure, unspecified site: Secondary | ICD-10-CM | POA: Diagnosis not present

## 2021-03-17 DIAGNOSIS — Z8742 Personal history of other diseases of the female genital tract: Secondary | ICD-10-CM | POA: Diagnosis not present

## 2021-03-17 DIAGNOSIS — Z01419 Encounter for gynecological examination (general) (routine) without abnormal findings: Secondary | ICD-10-CM | POA: Diagnosis not present

## 2021-03-17 DIAGNOSIS — D229 Melanocytic nevi, unspecified: Secondary | ICD-10-CM | POA: Diagnosis not present

## 2021-03-18 ENCOUNTER — Encounter: Payer: Self-pay | Admitting: Internal Medicine

## 2021-03-20 LAB — HM PAP SMEAR: HPV, high-risk: NEGATIVE

## 2021-04-01 ENCOUNTER — Other Ambulatory Visit: Payer: Self-pay | Admitting: Internal Medicine

## 2021-04-01 ENCOUNTER — Ambulatory Visit (INDEPENDENT_AMBULATORY_CARE_PROVIDER_SITE_OTHER): Payer: Medicare PPO | Admitting: Internal Medicine

## 2021-04-01 DIAGNOSIS — Z538 Procedure and treatment not carried out for other reasons: Secondary | ICD-10-CM

## 2021-04-07 ENCOUNTER — Other Ambulatory Visit: Payer: Self-pay

## 2021-04-08 ENCOUNTER — Other Ambulatory Visit: Payer: Self-pay

## 2021-04-08 ENCOUNTER — Encounter: Payer: Self-pay | Admitting: Internal Medicine

## 2021-04-08 ENCOUNTER — Ambulatory Visit (HOSPITAL_BASED_OUTPATIENT_CLINIC_OR_DEPARTMENT_OTHER)
Admission: RE | Admit: 2021-04-08 | Discharge: 2021-04-08 | Disposition: A | Discharge status: Home / Self Care | Payer: Medicare PPO | Source: Ambulatory Visit | Attending: Internal Medicine | Admitting: Internal Medicine

## 2021-04-08 ENCOUNTER — Ambulatory Visit (INDEPENDENT_AMBULATORY_CARE_PROVIDER_SITE_OTHER): Payer: Medicare PPO | Admitting: Internal Medicine

## 2021-04-08 VITALS — BP 132/84 | HR 83 | Temp 98.4°F | Resp 18 | Ht 68.0 in | Wt 194.1 lb

## 2021-04-08 DIAGNOSIS — I10 Essential (primary) hypertension: Secondary | ICD-10-CM

## 2021-04-08 DIAGNOSIS — Z0000 Encounter for general adult medical examination without abnormal findings: Secondary | ICD-10-CM | POA: Diagnosis not present

## 2021-04-08 DIAGNOSIS — E049 Nontoxic goiter, unspecified: Secondary | ICD-10-CM

## 2021-04-08 DIAGNOSIS — E042 Nontoxic multinodular goiter: Secondary | ICD-10-CM | POA: Diagnosis not present

## 2021-04-08 LAB — COMPREHENSIVE METABOLIC PANEL
ALT: 18 U/L (ref 0–35)
AST: 18 U/L (ref 0–37)
Albumin: 4.4 g/dL (ref 3.5–5.2)
Alkaline Phosphatase: 79 U/L (ref 39–117)
BUN: 18 mg/dL (ref 6–23)
CO2: 29 mEq/L (ref 19–32)
Calcium: 10 mg/dL (ref 8.4–10.5)
Chloride: 104 mEq/L (ref 96–112)
Creatinine, Ser: 0.94 mg/dL (ref 0.40–1.20)
GFR: 61.87 mL/min (ref >60.00)
Glucose, Bld: 91 mg/dL (ref 70–99)
Potassium: 4.4 mEq/L (ref 3.5–5.1)
Sodium: 139 mEq/L (ref 135–145)
Total Bilirubin: 0.5 mg/dL (ref 0.2–1.2)
Total Protein: 7.1 g/dL (ref 6.0–8.3)

## 2021-04-08 LAB — CBC WITH DIFFERENTIAL/PLATELET
Basophils Absolute: 0 10*3/uL (ref 0.0–0.1)
Basophils Relative: 1.1 % (ref 0.0–3.0)
Eosinophils Absolute: 0.2 10*3/uL (ref 0.0–0.7)
Eosinophils Relative: 3.6 % (ref 0.0–5.0)
HCT: 38.8 % (ref 36.0–46.0)
Hemoglobin: 12.2 g/dL (ref 12.0–15.0)
Lymphocytes Relative: 35.5 % (ref 12.0–46.0)
Lymphs Abs: 1.6 10*3/uL (ref 0.7–4.0)
MCHC: 31.4 g/dL (ref 30.0–36.0)
MCV: 85 fl (ref 78.0–100.0)
Monocytes Absolute: 0.4 10*3/uL (ref 0.1–1.0)
Monocytes Relative: 8.1 % (ref 3.0–12.0)
Neutro Abs: 2.3 10*3/uL (ref 1.4–7.7)
Neutrophils Relative %: 51.7 % (ref 43.0–77.0)
Platelets: 142 10*3/uL — ABNORMAL LOW (ref 150.0–400.0)
RBC: 4.56 Mil/uL (ref 3.87–5.11)
RDW: 14.7 % (ref 11.5–15.5)
WBC: 4.5 10*3/uL (ref 4.0–10.5)

## 2021-04-08 LAB — T3, FREE: T3, Free: 3.5 pg/mL (ref 2.3–4.2)

## 2021-04-08 LAB — TSH: TSH: 0.51 u[IU]/mL (ref 0.35–5.50)

## 2021-04-08 LAB — T4, FREE: Free T4: 0.75 ng/dL (ref 0.60–1.60)

## 2021-04-08 NOTE — Assessment & Plan Note (Signed)
Here for CPX HTN: On amlodipine, Lotensin, Aldactone.  BPs are still well controlled in the 130s but trending up.  We agreed on watch salt intake, continue monitoring BPs, labs. Anxiety, insomnia: On Ambien, contract signed today Goiter: Left side of the gland is increasing in size for the last 6 months per pt, exam is confirmatory, no tenderness, no fever or chills. Will get TFTs, ultrasound and refer back to Endo. Osteopenia: Rec calcium and vitamin D, last T score 08-2019 -2.4, not interested on Fosamax, next DEXA around 2023 or 2024. RTC 6 months

## 2021-04-08 NOTE — Assessment & Plan Note (Signed)
--  Td 2019 - pnm 13: 2018; pnm 23: 06/2018 - s/p Zostavax and Shingrix  -Up-to-date on COVID vaccines -Had a flu shot  -- Female care: Sees GYN regularly, [facility] 11/2020 (K PN) -- C[facility]: Colonoscopy:  01/10/2002 and 10- 2013, neg, 10 years  -- Labs :CMP, CBC TSH, free T3, free T4 - ACP package of information provided

## 2021-04-08 NOTE — Patient Instructions (Addendum)
Check the  blood pressure regularly BP GOAL is between 110/65 and  135/85. If it is consistently higher or lower, let me know     GO TO THE LAB : Get the blood work     [place], [place] back for for a checkup in 6 months  STOP BY THE FIRST FLOOR: Schedule the ultrasound of your thyroid

## 2021-04-08 NOTE — Progress Notes (Signed)
Subjective:    Patient ID: Leah Cameron, female    DOB: 04-Jan-1951, 70 y.o.   MRN: [id number]  DOS:  04/08/2021 Type of visit - description: cpx  In general feels well. For the last few months has noted the left side of the thyroid is growing.  No pain. Denies any fever chills or weight loss. No swallowing problems.  Wt Readings from Last 3 Encounters:  04/08/21 194 lb 2 oz (88.1 kg)  09/30/20 186 lb 4 oz (84.5 kg)  08/15/20 187 lb (84.8 kg)    Review of Systems  Other than above, a 14 point review of systems is negative       Past Medical History:  Diagnosis Date   Allergic rhinitis    Back pain, chronic    Fibromyalgia    sees Dr.Davenshwar   GERD (gastroesophageal reflux disease)    HPV (human papilloma virus) infection    Hypertension    Menopause    on lexapro was started by gyn   RLS (restless legs syndrome)    Scoliosis    Stricture esophagus    last EGD and dilatation 3/09   Syncope 4/11   admitted thought to be due to over treatment of hypertension   Thyromegaly    Vertigo    went to ED on 10/30    Past Surgical History:  Procedure Laterality Date   BACK SURGERY  09/05/2013   cage and rods L4 L5 (@ [facility])   BIOPSY THYROID  01/2009   negative    BREAST BIOPSY Left 09/06/2017   BREAST EXCISIONAL BIOPSY Left 2000   BREAST LUMPECTOMY  2000   COLONOSCOPY     KNEE ARTHROSCOPY  2003   right   KNEE SURGERY  1999/2002   left    SHOULDER SURGERY  2004   TONSILLECTOMY     TRIGGER FINGER RELEASE  08/2014   R THUMB   Social History   Socioeconomic History   Marital status: Married    Spouse name: Not on file   Number of children: 2   Years of education: Not on file   Highest education level: Not on file  Occupational History   Occupation:  Retired 2001, Public relations account executive, disable    Tobacco Use   Smoking status: Never   Smokeless tobacco: Never  Substance and Sexual Activity   Alcohol use: Yes    Comment: socially    Drug  use: No   Sexual activity: Not on file  Other Topics Concern   Not on file  Social History Narrative   Retired    3 g-kids   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 If you have any questions, ask your nurse or doctor.  amLODipine 5 MG tablet Commonly known as: NORVASC Take 1 tablet (5 mg total) by mouth daily.   azelastine 0.1 % nasal spray Commonly known as: ASTELIN Place 2 sprays into both nostrils 2 (two) times daily.   benazepril 40 MG tablet Commonly known as: LOTENSIN Take 1 tablet (40 mg total) by mouth daily.   Calcium Carbonate-Vitamin D 600-400 MG-UNIT chew tablet Chew 1 tablet by mouth 2 (two) times daily.   escitalopram 10 MG tablet Commonly known as: LEXAPRO TAKE 1 TABLET BY MOUTH EVERY DAY   fluticasone 50 MCG/ACT nasal spray Commonly known as: FLONASE Place 2 sprays into both nostrils daily.   multivitamin with minerals Tabs tablet Take 1 tablet by mouth daily.   spironolactone 25 MG tablet Commonly known as: ALDACTONE TAKE 1 TABLET (25 MG TOTAL) BY MOUTH DAILY.   VITAMIN C PO Take 1 tablet by mouth daily.   zolpidem 10 MG tablet Commonly known as: AMBIEN TAKE 1 TABLET BY MOUTH AT BEDTIME AS NEEDED FOR SLEEP.           Objective:   Physical Exam BP 132/84 (BP Location: Left Arm, Patient Position: Sitting, Cuff Size: Small)    Pulse 83     Temp 98.4 F (36.9 C) (Oral)    Resp 18    Ht 5\' 8"  (1.727 m)    Wt 194 lb 2 oz (88.1 kg)    LMP  (LMP Unknown)    SpO2 97%    BMI 29.52 kg/m  General: Well developed, NAD, BMI noted Neck: Thyromegaly noted, larger on the left, left side is also  nodular (no hard nodules though).  No tenderness Has a couple of benign feeling lymph nodes at the right distal neck.  They are mobile, 1 cm or less HEENT:  Normocephalic . Face symmetric, atraumatic Lungs:  CTA B Normal respiratory effort, no intercostal retractions, no accessory muscle use. Heart: RRR,  no murmur.  Abdomen:  Not distended, soft, non-tender. No rebound or rigidity.   Lower extremities: no pretibial edema bilaterally  Skin: Exposed areas without rash. Not pale. Not jaundice Neurologic:  alert & oriented X3.  Speech normal, gait appropriate for age and unassisted Strength symmetric and appropriate for age.  Psych: Cognition and judgment appear intact.  Cooperative with normal attention span and concentration.  Behavior appropriate. No anxious or depressed appearing.     Assessment    Assessment HTN --hypokalemia, ?primary hyperaldosteronism Anxiety, insomnia --- start to rx ambien here ~ 04-2015 (previously by rheumatology) Multinodular goiter, Dr. Dwyane Dee, seen [phone]  ---01-2009 thyroid Bx (-), BX again 11-2014.  [place] 08/2019: Unchanged  GI: --GERD  --esophageal stricture, dilatation  2009 MSK: --fibromyalgia >>> stop seen Dr Herold Harms by the end of 2016 b/c she felt  better  --Chronic back pain -- resolved after 2015 back surgery --RLS -- Osteopenia: DEXA  11/28/13 Tscore -2.7; DEXA 09/09/16 T score -2.4 Syncope 2011 (? due to over rx of HTN) Self dc ASA 2016 d/t easy bruising   PLAN:   Here for CPX HTN: On amlodipine, Lotensin, Aldactone.  BPs are still well controlled in the 130s but trending up.  We agreed on watch salt intake, continue monitoring BPs, labs. Anxiety, insomnia: On Ambien, contract signed  today Goiter: Left side of the gland is increasing in size for the last 6 months per pt, exam is confirmatory, no tenderness, no fever or chills. Will get TFTs, ultrasound and refer back to Endo. Osteopenia: Rec calcium and vitamin D, last T  score 08-2019 -2.4, not interested on Fosamax, next DEXA around 2023 or 2024. RTC 6 months    This visit occurred during the SARS-CoV-2 public health emergency.  Safety protocols were in place, including screening questions prior to the visit, additional usage of staff PPE, and extensive cleaning of exam room while observing appropriate contact time as indicated for disinfecting solutions.

## 2021-04-12 ENCOUNTER — Other Ambulatory Visit: Payer: Self-pay | Admitting: Internal Medicine

## 2021-04-29 ENCOUNTER — Other Ambulatory Visit: Payer: Self-pay | Admitting: Internal Medicine

## 2021-05-05 ENCOUNTER — Other Ambulatory Visit: Payer: Self-pay

## 2021-05-05 MED ORDER — ESCITALOPRAM OXALATE 10 MG PO TABS: 10.0000 mg | ORAL_TABLET | Freq: Every day | ORAL | 1 refills | Status: DC

## 2021-05-06 ENCOUNTER — Other Ambulatory Visit: Payer: Self-pay

## 2021-05-06 MED ORDER — AMLODIPINE BESYLATE 5 MG PO TABS: 5.0000 mg | ORAL_TABLET | Freq: Every day | ORAL | 1 refills | Status: DC

## 2021-05-06 MED ORDER — BENAZEPRIL HCL 40 MG PO TABS: 40.0000 mg | ORAL_TABLET | Freq: Every day | ORAL | 1 refills | Status: DC

## 2021-05-06 MED ORDER — SPIRONOLACTONE 25 MG PO TABS: 25.0000 mg | ORAL_TABLET | Freq: Every day | ORAL | 1 refills | Status: DC

## 2021-05-12 ENCOUNTER — Telehealth: Payer: Self-pay | Admitting: Internal Medicine

## 2021-05-12 NOTE — Telephone Encounter (Signed)
Pt is requesting Ambien to go to her mail order pharmacy for now on. Can you send please?

## 2021-05-12 NOTE — Telephone Encounter (Signed)
Medication: zolpidem (AMBIEN) 10 MG tablet   Has the patient contacted their pharmacy? Yes.   (If no, request that the patient contact the pharmacy for the refill.) (If yes, when and what did the pharmacy advise?)  Preferred Pharmacy (with phone number or street name):  [facility] [address]  [place], [place] [address]  Phone:  [phone]  Fax:  [phone]   Agent: Please be advised that RX refills may take up to 3 business days. We ask that you follow-up with your pharmacy.

## 2021-05-13 MED ORDER — ZOLPIDEM TARTRATE 10 MG PO TABS: 10.0000 mg | ORAL_TABLET | Freq: Every evening | ORAL | 1 refills | Status: DC | PRN

## 2021-05-13 NOTE — Telephone Encounter (Signed)
PDMP reviewed, she just got 90 tablets however she is asking for a different pharmacy.  Rx sent

## 2021-06-02 DIAGNOSIS — M67912 Unspecified disorder of synovium and tendon, left shoulder: Secondary | ICD-10-CM | POA: Diagnosis not present

## 2021-06-02 DIAGNOSIS — M79671 Pain in right foot: Secondary | ICD-10-CM | POA: Diagnosis not present

## 2021-06-02 DIAGNOSIS — M7062 Trochanteric bursitis, left hip: Secondary | ICD-10-CM | POA: Diagnosis not present

## 2021-06-07 ENCOUNTER — Encounter: Payer: Self-pay | Admitting: Internal Medicine

## 2021-06-08 NOTE — Telephone Encounter (Signed)
I called patient to schedule her AWV.  Patient wanted to let Dr.Paz know that she hasn't heard from anyone about the referral for an endocrinologist. Patient said she thinks she was referred to an endocrinologist before. Patient  wants to know if there is an endocrinologist on [employer]?

## 2021-06-09 ENCOUNTER — Other Ambulatory Visit: Payer: Self-pay

## 2021-06-09 ENCOUNTER — Ambulatory Visit (INDEPENDENT_AMBULATORY_CARE_PROVIDER_SITE_OTHER): Payer: Medicare PPO

## 2021-06-09 DIAGNOSIS — Z0000 Encounter for general adult medical examination without abnormal findings: Secondary | ICD-10-CM

## 2021-06-09 NOTE — Progress Notes (Addendum)
Virtual Visit via Telephone Note  I connected with  Leah Cameron on 06/09/21 at  1:00 PM EST by telephone and verified that I am speaking with the correct person using two identifiers.  Medicare Annual Wellness visit completed telephonically due to Covid-19 pandemic.   Persons participating in this call: This Health Coach and this patient.   Location: Patient: home Provider: office   I discussed the limitations, risks, security and privacy concerns of performing an evaluation and management service by telephone and the availability of in person appointments. The patient expressed understanding and agreed to proceed.  Unable to perform video visit due to video visit attempted and failed and/or patient does not have video capability.   Some vital signs may be absent or patient reported.   Willette Brace, LPN   Subjective:   Leah Cameron is a 71 y.o. female who presents for an Initial Medicare Annual Wellness Visit.  Review of Systems     Cardiac Risk Factors include: advanced age (>64men, >35 women);hypertension     Objective:    There were no vitals filed for this visit. There is no height or weight on file to calculate BMI.  Advanced Directives 06/09/2021  Does Patient Have a Medical Advance Directive? No  Would patient like information on creating a medical advance directive? Yes (MAU/Ambulatory/Procedural Areas - Information given)    Current Medications (verified) Outpatient Encounter Medications as of 06/09/2021  Medication Sig   amLODipine (NORVASC) 5 MG tablet Take 1 tablet (5 mg total) by mouth daily.   Ascorbic Acid (VITAMIN C PO) Take 1 tablet by mouth daily.   azelastine (ASTELIN) 0.1 % nasal spray Place 2 sprays into both nostrils 2 (two) times daily.   benazepril (LOTENSIN) 40 MG tablet Take 1 tablet (40 mg total) by mouth daily.   Calcium Carbonate-Vitamin D 600-400 MG-UNIT chew tablet Chew 1 tablet by mouth 2 (two) times daily.     Cyanocobalamin (VITAMIN B 12 PO) Take by mouth.   escitalopram (LEXAPRO) 10 MG tablet Take 1 tablet (10 mg total) by mouth daily.   fluticasone (FLONASE) 50 MCG/ACT nasal spray Place 2 sprays into both nostrils daily.   Multiple Vitamin (MULTIVITAMIN WITH MINERALS) TABS Take 1 tablet by mouth daily.   spironolactone (ALDACTONE) 25 MG tablet Take 1 tablet (25 mg total) by mouth daily.   zolpidem (AMBIEN) 10 MG tablet Take 1 tablet (10 mg total) by mouth at bedtime as needed. for sleep   No facility-administered encounter medications on file as of 06/09/2021.    Allergies (verified) Cyclobenzaprine hcl, Chlorzoxazone, Hydrocodone-acetaminophen, Penicillins, and Tramadol hcl   History: Past Medical History:  Diagnosis Date   Allergic rhinitis    Back pain, chronic    Fibromyalgia    sees Dr.Davenshwar   GERD (gastroesophageal reflux disease)    HPV (human papilloma virus) infection    Hypertension    Menopause    on lexapro was started by gyn   RLS (restless legs syndrome)    Scoliosis    Stricture esophagus    last EGD and dilatation 3/09   Syncope 4/11   admitted thought to be due to over treatment of hypertension   Thyromegaly    Vertigo    went to ED on 10/30   Past Surgical History:  Procedure Laterality Date   BACK SURGERY  09/05/2013   cage and rods L4 L5 (@ [facility])   BIOPSY THYROID  01/2009   negative    BREAST BIOPSY Left  09/06/2017   BREAST EXCISIONAL BIOPSY Left 2000   BREAST LUMPECTOMY  2000   COLONOSCOPY     KNEE ARTHROSCOPY  2003   right   KNEE SURGERY  1999/2002   left    SHOULDER SURGERY  2004   TONSILLECTOMY     TRIGGER FINGER RELEASE  08/2014   R THUMB   Family History  Problem Relation Age of Onset   Stroke Mother        M, F, 1/2 sister, sister    Thyroid disease Mother    Breast cancer Other        1/2 sister   Diabetes Other        aunt   Coronary artery disease Other        GF late in life   Lung cancer Other        cousin   Stroke  Sister    Dementia Sister    Colon cancer Neg Hx    Social History   Socioeconomic History   Marital status: Married    Spouse name: Not on file   Number of children: 2   Years of education: Not on file   Highest education level: Not on file  Occupational History   Occupation:  Retired 2001, Public relations account executive, disable    Tobacco Use   Smoking status: Never   Smokeless tobacco: Never  Substance and Sexual Activity   Alcohol use: Yes    Comment: socially    Drug use: No   Sexual activity: Not on file  Other Topics Concern   Not on file  Social History Narrative   Retired    3 g-kids   Research officer, 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         Social Determinants of Radio broadcast assistant Strain: Low Risk    Difficulty of Paying Living Expenses: Not hard at all  Food Insecurity: No Food Insecurity   Worried About Charity fundraiser in the Last Year: Never true   Arboriculturist in the Last Year: Never true  Transportation Needs: No Transportation Needs   Lack of Transportation (Medical): No   Lack of Transportation (Non-Medical): No  Physical Activity: Inactive   Days of Exercise per Week: 0 days   Minutes of Exercise per Session: 0 min  Stress: No Stress Concern Present   Feeling of Stress : Not at all  Social Connections: Socially Integrated   Frequency of Communication with Friends and Family: More than three times a week   Frequency of Social Gatherings with Friends and Family: More than three times a week   Attends Religious Services: 1 to 4 times per year   Active Member of [employer] or Organizations: Yes   Attends Archivist Meetings: 1 to 4 times per year   Marital Status: Married    Tobacco Counseling Counseling given: Not Answered   Clinical Intake:  Pre-visit preparation completed: Yes  Pain : No/denies pain     BMI - recorded: 29.52 Nutritional Status: BMI 25 -29 Overweight Nutritional Risks: None Diabetes: No     Diabetic?No  Interpreter  Needed?: No  Information entered by :: Charlott Rakes, LPN   Activities of Daily Living In your present state of health, do you have any difficulty performing the following activities: 06/09/2021 09/30/2020  Hearing? N N  Vision? N N  Difficulty concentrating or making decisions? N N  Walking or climbing stairs? N N  Dressing or bathing? N N  Doing errands, shopping? N N  Preparing Food and eating ? N -  Using the Toilet? N -  In the past six months, have you accidently leaked urine? N -  Do you have problems with loss of bowel control? N -  Managing your Medications? N -  Managing your Finances? N -  Housekeeping or managing your Housekeeping? N -  Some recent data might be hidden    Patient Care Team: Colon Branch, MD as PCP - General Rexene Agent, MD as Attending Physician (Nephrology) Eliezer Lofts, [place] as Physician Assistant (Rheumatology) Elayne Snare, MD as Consulting Physician (Endocrinology) Leo Grosser Seymour Bars, MD (Inactive) as Consulting Physician (Obstetrics and Gynecology)  Indicate any recent Medical Services you may have received from other than [facility] providers in the past year (date may be approximate).     Assessment:   This is a routine wellness examination for Leah Cameron.  Hearing/Vision screen Hearing Screening - Comments:: Pt denies any hearing issues  Vision Screening - Comments:: Pt follows up with [facility] for annual eye exams  Dietary issues and exercise activities discussed: Current Exercise Habits: The patient does not participate in regular exercise at present   Goals Addressed             This Visit's Progress    Patient Stated       Staying mentally healthy        Depression Screen PHQ 2/9 Scores 06/09/2021 04/08/2021 09/30/2020 08/15/2020 04/01/2020 08/08/2019 03/06/2018  PHQ - 2 Score 0 0 0 0 0 0 0    Fall Risk Fall Risk  06/09/2021 04/08/2021 09/30/2020 08/15/2020 04/01/2020  Falls in the past year? 0 0 0 0 0  Number falls in past yr:  0 0 0 0 0  Injury with Fall? 0 0 0 0 0  Risk for fall due to : Impaired vision - - No Fall Risks -  Follow up Falls prevention discussed Falls evaluation completed Falls evaluation completed - -    FALL RISK PREVENTION PERTAINING TO THE HOME:  Any stairs in or around the home? Yes  If so, are there any without handrails? No  Home free of loose throw rugs in walkways, pet beds, electrical cords, etc? Yes  Adequate lighting in your home to reduce risk of falls? Yes   ASSISTIVE DEVICES UTILIZED TO PREVENT FALLS:  Life alert? No  Use of a cane, walker or w/c? No  Grab bars in the bathroom? No  Shower chair or bench in shower? Yes  Elevated toilet seat or a handicapped toilet? No   TIMED UP AND GO:  Was the test performed? No .   Cognitive Function:     6CIT Screen 06/09/2021  What Year? 0 points  What month? 0 points  What time? 0 points  Count back from 20 0 points  Months in reverse 2 points  Repeat phrase 0 points  Total Score 2    Immunizations Immunization History  Administered Date(s) Administered   Influenza Split 01/18/2011, 04/20/2012, 02/04/2021   Influenza Whole 01/01/2009, 03/20/2010   Influenza, High Dose Seasonal PF 04/09/2018   Influenza,inj,Quad PF,6+ Mos 01/03/2013, 02/28/2014, 01/22/2015   Influenza-Unspecified 01/07/2016, 01/17/2017, 12/26/2018, 12/25/2019   PFIZER(Purple Top)SARS-COV-2 Vaccination 05/01/2019, 05/23/2019, 01/09/2020, 11/02/2020   Pfizer Covid-19 Vaccine Bivalent Booster 17yrs & up 03/17/2021   Pneumococcal Conjugate-13 02/25/2017   Pneumococcal Polysaccharide-23 06/13/2018   Td 10/30/2007, 11/16/2017   Zoster Recombinat (Shingrix) 12/05/2020, 02/04/2021   Zoster, Live 08/08/2013    TDAP status:  Up to date  Flu Vaccine status: Up to date  Pneumococcal vaccine status: Up to date  Covid-19 vaccine status: Completed vaccines  Qualifies for Shingles Vaccine? Yes   Zostavax completed Yes   Shingrix Completed?: Yes  Screening  Tests Health Maintenance  Topic Date Due   MAMMOGRAM  11/12/2021   COLONOSCOPY (Pts 45-25yrs Insurance coverage will need to be confirmed)  02/28/2022   TETANUS/TDAP  11/17/2027   Pneumonia Vaccine 46+ Years old  Completed   INFLUENZA VACCINE  Completed   DEXA SCAN  Completed   COVID-19 Vaccine  Completed   Hepatitis C Screening  Completed   Zoster Vaccines- Shingrix  Completed   HPV VACCINES  Aged Out    Health Maintenance  There are no preventive care reminders to display for this patient.  Colorectal cancer screening: Type of screening: Colonoscopy. Completed 02/29/12. Repeat every 10 years  Mammogram status: Completed 11/12/20. Repeat every year  Bone Density status: Completed 08/19/19. Results reflect: Bone density results: OSTEOPENIA. Repeat every 2 years.   Additional Screening:  Hepatitis C Screening:  Completed 11/27/14  Vision Screening: Recommended annual ophthalmology exams for early detection of glaucoma and other disorders of the eye. Is the patient up to date with their annual eye exam?  Yes  Who is the provider or what is the name of the office in which the patient attends annual eye exams? [facility]  If pt is not established with a provider, would they like to be referred to a provider to establish care? No .   Dental Screening: Recommended annual dental exams for proper oral hygiene  Community Resource Referral / Chronic Care Management: CRR required this visit?  No   CCM required this visit?  No      Plan:     I have personally reviewed and noted the following in the patients chart:   Medical and social history Use of alcohol, tobacco or illicit drugs  Current medications and supplements including opioid prescriptions. Patient is not currently taking opioid prescriptions. Functional ability and status Nutritional status Physical activity Advanced directives List of other physicians Hospitalizations, surgeries, and ER visits in previous 12  months Vitals Screenings to include cognitive, depression, and falls Referrals and appointments  In addition, I have reviewed and discussed with patient certain preventive protocols, quality metrics, and best practice recommendations. A written personalized care plan for preventive services as well as general preventive health recommendations were provided to patient.     Willette Brace, LPN   10/09/4763   Nurse Notes: None  I have reviewed and agree with Health Coaches documentation.  Kathlene November, MD

## 2021-06-09 NOTE — Patient Instructions (Signed)
[name] , Thank you for taking time to come for your Medicare Wellness Visit. I appreciate your ongoing commitment to your health goals. Please review the following plan we discussed and let me know if I can assist you in the future.   Screening recommendations/referrals: Colonoscopy: Done 02/29/12 repeat every 10 years  Mammogram: Done 11/12/20 repeat  every year Bone Density: Done 08/29/19 repeat every 2 years  Recommended yearly ophthalmology/optometry visit for glaucoma screening and checkup Recommended yearly dental visit for hygiene and checkup  Vaccinations: Influenza vaccine: Done 02/04/21 repeat every year  Pneumococcal vaccine: Up to date Tdap vaccine: Done 11/16/17 repeat every 10 years  Shingles vaccine: Completed 8/26, 02/04/21   Covid-19:Done 1/19, 2/10, 01/01/20 & 7/24, 03/17/21  Advanced directives: Advance directive discussed with you today. I have provided a copy for you to complete at home and have notarized. Once this is complete please bring a copy in to our office so we can scan it into your chart.  Conditions/risks identified: stay mentally healthy   Next appointment: Follow up in one year for your annual wellness visit    Preventive Care 65 Years and Older, Female Preventive care refers to lifestyle choices and visits with your health care provider that can promote health and wellness. What does preventive care include? A yearly physical exam. This is also called an annual well check. Dental exams once or twice a year. Routine eye exams. Ask your health care provider how often you should have your eyes checked. Personal lifestyle choices, including: Daily care of your teeth and gums. Regular physical activity. Eating a healthy diet. Avoiding tobacco and drug use. Limiting alcohol use. Practicing safe sex. Taking low-dose aspirin every day. Taking vitamin and mineral supplements as recommended by your health care provider. What happens during an annual  well check? The services and screenings done by your health care provider during your annual well check will depend on your age, overall health, lifestyle risk factors, and family history of disease. Counseling  Your health care provider may ask you questions about your: Alcohol use. Tobacco use. Drug use. Emotional well-being. Home and relationship well-being. Sexual activity. Eating habits. History of falls. Memory and ability to understand (cognition). Work and work Statistician. Reproductive health. Screening  You may have the following tests or measurements: Height, weight, and BMI. Blood pressure. Lipid and cholesterol levels. These may be checked every 5 years, or more frequently if you are over 78 years old. Skin check. Lung cancer screening. You may have this screening every year starting at age 22 if you have a 30-pack-year history of smoking and currently smoke or have quit within the past 15 years. Fecal occult blood test (FOBT) of the stool. You may have this test every year starting at age 78. Flexible sigmoidoscopy or colonoscopy. You may have a sigmoidoscopy every 5 years or a colonoscopy every 10 years starting at age 30. Hepatitis C blood test. Hepatitis B blood test. Sexually transmitted disease (STD) testing. Diabetes screening. This is done by checking your blood sugar (glucose) after you have not eaten for a while (fasting). You may have this done every 1-3 years. Bone density scan. This is done to screen for osteoporosis. You may have this done starting at age 89. Mammogram. This may be done every 1-2 years. Talk to your health care provider about how often you should have regular mammograms. Talk with your health care provider about your test results, treatment options, and if necessary, the need for more tests.  Vaccines  Your health care provider may recommend certain vaccines, such as: Influenza vaccine. This is recommended every year. Tetanus, diphtheria,  and acellular pertussis (Tdap, Td) vaccine. You may need a Td booster every 10 years. Zoster vaccine. You may need this after age 39. Pneumococcal 13-valent conjugate (PCV13) vaccine. One dose is recommended after age 31. Pneumococcal polysaccharide (PPSV23) vaccine. One dose is recommended after age 70. Talk to your health care provider about which screenings and vaccines you need and how often you need them. This information is not intended to replace advice given to you by your health care provider. Make sure you discuss any questions you have with your health care provider. Document Released: 04/25/2015 Document Revised: 12/17/2015 Document Reviewed: 01/28/2015 Elsevier Interactive Patient Education  2017 [place] Prevention in the Home Falls can cause injuries. They can happen to people of all ages. There are many things you can do to make your home safe and to help prevent falls. What can I do on the outside of my home? Regularly fix the edges of walkways and driveways and fix any cracks. Remove anything that might make you trip as you walk through a door, such as a raised step or threshold. Trim any bushes or trees on the path to your home. Use bright outdoor lighting. Clear any walking paths of anything that might make someone trip, such as rocks or tools. Regularly check to see if handrails are loose or broken. Make sure that both sides of any steps have handrails. Any raised decks and porches should have guardrails on the edges. Have any leaves, snow, or ice cleared regularly. Use sand or salt on walking paths during winter. Clean up any spills in your garage right away. This includes oil or grease spills. What can I do in the bathroom? Use night lights. Install grab bars by the toilet and in the tub and shower. Do not use towel bars as grab bars. Use non-skid mats or decals in the tub or shower. If you need to sit down in the shower, use a plastic, non-slip  stool. Keep the floor dry. Clean up any water that spills on the floor as soon as it happens. Remove soap buildup in the tub or shower regularly. Attach bath mats securely with double-sided non-slip rug tape. Do not have throw rugs and other things on the floor that can make you trip. What can I do in the bedroom? Use night lights. Make sure that you have a light by your bed that is easy to reach. Do not use any sheets or blankets that are too big for your bed. They should not hang down onto the floor. Have a firm chair that has side arms. You can use this for support while you get dressed. Do not have throw rugs and other things on the floor that can make you trip. What can I do in the kitchen? Clean up any spills right away. Avoid walking on wet floors. Keep items that you use a lot in easy-to-reach places. If you need to reach something above you, use a strong step stool that has a grab bar. Keep electrical cords out of the way. Do not use floor polish or wax that makes floors slippery. If you must use wax, use non-skid floor wax. Do not have throw rugs and other things on the floor that can make you trip. What can I do with my stairs? Do not leave any items on the stairs. Make sure that  there are handrails on both sides of the stairs and use them. Fix handrails that are broken or loose. Make sure that handrails are as long as the stairways. Check any carpeting to make sure that it is firmly attached to the stairs. Fix any carpet that is loose or worn. Avoid having throw rugs at the top or bottom of the stairs. If you do have throw rugs, attach them to the floor with carpet tape. Make sure that you have a light switch at the top of the stairs and the bottom of the stairs. If you do not have them, ask someone to add them for you. What else can I do to help prevent falls? Wear shoes that: Do not have high heels. Have rubber bottoms. Are comfortable and fit you well. Are closed at the  toe. Do not wear sandals. If you use a stepladder: Make sure that it is fully opened. Do not climb a closed stepladder. Make sure that both sides of the stepladder are locked into place. Ask someone to hold it for you, if possible. Clearly mark and make sure that you can see: Any grab bars or handrails. First and last steps. Where the edge of each step is. Use tools that help you move around (mobility aids) if they are needed. These include: Canes. Walkers. Scooters. Crutches. Turn on the lights when you go into a dark area. Replace any light bulbs as soon as they burn out. Set up your furniture so you have a clear path. Avoid moving your furniture around. If any of your floors are uneven, fix them. If there are any pets around you, be aware of where they are. Review your medicines with your doctor. Some medicines can make you feel dizzy. This can increase your chance of falling. Ask your doctor what other things that you can do to help prevent falls. This information is not intended to replace advice given to you by your health care provider. Make sure you discuss any questions you have with your health care provider. Document Released: 01/23/2009 Document Revised: 09/04/2015 Document Reviewed: 05/03/2014 Elsevier Interactive Patient Education  2017 [employer].

## 2021-06-12 ENCOUNTER — Encounter: Payer: Self-pay | Admitting: Internal Medicine

## 2021-07-03 LAB — LIPID PANEL
Cholesterol: 177 (ref 0–200)
HDL: 47 (ref 35–70)
LDL Cholesterol: 110
Triglycerides: 113 (ref 40–160)

## 2021-07-03 LAB — HEMOGLOBIN A1C: Hemoglobin A1C: 6.1

## 2021-07-22 ENCOUNTER — Encounter: Payer: Self-pay | Admitting: Internal Medicine

## 2021-07-24 DIAGNOSIS — M7062 Trochanteric bursitis, left hip: Secondary | ICD-10-CM | POA: Diagnosis not present

## 2021-07-24 DIAGNOSIS — M67912 Unspecified disorder of synovium and tendon, left shoulder: Secondary | ICD-10-CM | POA: Diagnosis not present

## 2021-08-07 ENCOUNTER — Ambulatory Visit: Payer: Medicare PPO | Admitting: Internal Medicine

## 2021-08-07 ENCOUNTER — Encounter: Payer: Self-pay | Admitting: Internal Medicine

## 2021-08-07 VITALS — BP 120/80 | HR 72 | Ht 68.0 in | Wt 201.0 lb

## 2021-08-07 DIAGNOSIS — E042 Nontoxic multinodular goiter: Secondary | ICD-10-CM

## 2021-08-07 LAB — TSH: TSH: 0.61 u[IU]/mL (ref 0.35–5.50)

## 2021-08-07 LAB — T4, FREE: Free T4: 0.8 ng/dL (ref 0.60–1.60)

## 2021-08-07 NOTE — Progress Notes (Signed)
? ? ?Name: Leah Cameron  ?MRN/ DOB: [id number], 02-21-1951    ?Age/ Sex: 71 y.o., female   ? ?PCP: Colon Branch, MD   ?Reason for Endocrinology Evaluation: MNG  ?   ?Date of Initial Endocrinology Evaluation: 08/07/2021   ? ? ?HPI: ?Leah Cameron is a 71 y.o. female with a past medical history of HTN, multinodular goiter, and fibromyalgia. The patient presented for initial endocrinology clinic visit on 08/07/2021 for consultative assistance with her MNG.  ? ?Patient has been diagnosed with multinodular goiter since 2010.  She is status post FNA of the left thyroid nodule in October 2010 with benign cytology, at the time the nodule was 2.2 cm.  This nodule has had another benign FNA in 2016 with nodule size 2.0 cm at the time. ? ? ?On her most recent thyroid ultrasound dated April 08, 2021 , the left nodule has enlarged to 5.9 cm. ? ?She has noted gradual increase of the neck over the past 2 years  ?Denies neck pain  ?Has occasional dysphagia , hx of esophogeal dilatation  ?Denies palpitations  ?Denies loose stools/diarrhea  ?Denies tremors  ?No neck radiation  ? ? ?Mother with thyroid disease  ? ? ?HISTORY:  ?Past Medical History:  ?Past Medical History:  ?Diagnosis Date  ? Allergic rhinitis   ? Back pain, chronic   ? Fibromyalgia   ? sees Dr.Davenshwar  ? GERD (gastroesophageal reflux disease)   ? HPV (human papilloma virus) infection   ? Hypertension   ? Menopause   ? on lexapro was started by gyn  ? RLS (restless legs syndrome)   ? Scoliosis   ? Stricture esophagus   ? last EGD and dilatation 3/09  ? Syncope 4/11  ? admitted thought to be due to over treatment of hypertension  ? Thyromegaly   ? Vertigo   ? went to ED on 10/30  ? ?Past Surgical History:  ?Past Surgical History:  ?Procedure Laterality Date  ? BACK SURGERY  09/05/2013  ? cage and rods L4 L5 (@ [facility])  ? BIOPSY THYROID  01/2009  ? negative   ? BREAST BIOPSY Left 09/06/2017  ? BREAST EXCISIONAL BIOPSY Left 2000  ? BREAST  LUMPECTOMY  2000  ? COLONOSCOPY    ? KNEE ARTHROSCOPY  2003  ? right  ? KNEE SURGERY  1999/2002  ? left   ? SHOULDER SURGERY  2004  ? TONSILLECTOMY    ? TRIGGER FINGER RELEASE  08/2014  ? R THUMB  ?  ?Social History:  reports that she has never smoked. She has never used smokeless tobacco. She reports current alcohol use. She reports that she does not use drugs. ?Family History: family history includes Breast cancer in an other family member; Coronary artery disease in an other family member; Dementia in her sister; Diabetes in an other family member; Lung cancer in an other family member; Stroke in her mother and sister; Thyroid disease in her mother. ? ? ?HOME MEDICATIONS: ?Allergies as of 08/07/2021   ? ?   Reactions  ? Cyclobenzaprine Hcl Other (See Comments)  ? Face swelling  ? Chlorzoxazone Rash, Swelling  ? Hydrocodone-acetaminophen Rash  ? Penicillins Swelling, Rash  ? Tolerated cefazolin  ? Tramadol Hcl Itching, Rash  ? ?  ? ?  ?Medication List  ?  ? ?  ? Accurate as of August 07, 2021  2:21 PM. If you have any questions, ask your nurse or doctor.  ?  ?  ? ?  ? ?  amLODipine 5 MG tablet ?Commonly known as: NORVASC ?Take 1 tablet (5 mg total) by mouth daily. ?  ?azelastine 0.1 % nasal spray ?Commonly known as: ASTELIN ?Place 2 sprays into both nostrils 2 (two) times daily. ?  ?benazepril 40 MG tablet ?Commonly known as: LOTENSIN ?Take 1 tablet (40 mg total) by mouth daily. ?  ?Calcium Carbonate-Vitamin D 600-400 MG-UNIT chew tablet ?Chew 1 tablet by mouth 2 (two) times daily. ?  ?escitalopram 10 MG tablet ?Commonly known as: LEXAPRO ?Take 1 tablet (10 mg total) by mouth daily. ?  ?fluticasone 50 MCG/ACT nasal spray ?Commonly known as: FLONASE ?Place 2 sprays into both nostrils daily. ?  ?multivitamin with minerals Tabs tablet ?Take 1 tablet by mouth daily. ?  ?spironolactone 25 MG tablet ?Commonly known as: ALDACTONE ?Take 1 tablet (25 mg total) by mouth daily. ?  ?VITAMIN B 12 PO ?Take by mouth. ?  ?VITAMIN C  PO ?Take 1 tablet by mouth daily. ?  ?zolpidem 10 MG tablet ?Commonly known as: AMBIEN ?Take 1 tablet (10 mg total) by mouth at bedtime as needed. for sleep ?  ? ?  ?  ? ? ?REVIEW OF SYSTEMS: ?A comprehensive ROS was conducted with the patient and is negative except as per HPI  ? ? ? ?OBJECTIVE:  ?VS: BP 120/80 (BP Location: Left Arm, Patient Position: Sitting, Cuff Size: Large)   Pulse 72   Ht '5\' 8"'$  (1.727 m)   Wt 201 lb (91.2 kg)   LMP  (LMP Unknown)   SpO2 99%   BMI 30.56 kg/m?   ? ?Wt Readings from Last 3 Encounters:  ?08/07/21 201 lb (91.2 kg)  ?04/08/21 194 lb 2 oz (88.1 kg)  ?09/30/20 186 lb 4 oz (84.5 kg)  ? ? ? ?EXAM: ?General: Pt appears well and is in NAD  ?Neck: General: Supple without adenopathy. ?Thyroid: Left thyroid nodule appreciated.   ?Lungs: Clear with good BS bilat with no rales, rhonchi, or wheezes  ?Heart: Auscultation: RRR.  ?Abdomen: Normoactive bowel sounds, soft, nontender, without masses or organomegaly palpable  ?Extremities:  ?BL LE: No pretibial edema normal ROM and strength.  ?Mental Status: Judgment, insight: Intact ?Orientation: Oriented to time, place, and person ?Mood and affect: No depression, anxiety, or agitation  ? ? ? ?DATA REVIEWED: ? ? Latest Reference Range & Units 08/07/21 14:01  ?TSH 0.35 - 5.50 uIU/mL 0.61  ?T4,Free(Direct) 0.60 - 1.60 ng/dL 0.80  ? ? ?Thyroid Ultrasound 04/08/2021 ? ? Estimated total number of nodules >/= 1 cm: 6 ?  ?Number of spongiform nodules >/=  2 cm not described below (TR1): 0 ?  ?Number of mixed cystic and solid nodules >/= 1.5 cm not described ?below ([place]): 0 ?  ?_________________________________________________________ ?  ?Nodule # 1: ?  ?Location: Right; inferior ?  ?Maximum size: 2.1 cm; Other 2 dimensions: 1.1 x 1.2 cm ?  ?Composition: solid/almost completely solid (2) ?  ?Echogenicity: isoechoic (1) ?  ?Shape: not taller-than-wide (0) ?  ?Margins: ill-defined (0) ?  ?Echogenic foci: none (0) ?  ?ACR TI-RADS total points: 3. ?  ?ACR  TI-RADS risk category: TR3 (3 points). ?  ?ACR TI-RADS recommendations: ?  ?*Given size (>/= 1.5 - 2.4 cm) and appearance, a follow-up ?ultrasound in 1 year should be considered based on TI-RADS criteria. ?  ?_________________________________________________________ ?  ?Nodule 2: Predominantly cystic 1.2 x 0.8 x 1.0 cm right mid thyroid ?nodule does not meet criteria for imaging surveillance or FNA. ?  ?_________________________________________________________ ?  ?Nodule 3:  Subcentimeter isoechoic right inferior thyroid nodule does ?not meet criteria for FNA or imaging follow-up. ?  ?_________________________________________________________ ?  ?Nodule 4: 5.9 x 3.1 x 4.0 cm nodule occupying the majority of the ?left thyroid lobe does not appear significantly changed since prior ?examination. Prior FNA of this nodule was performed in 2010 and ?2016. Please correlate with prior results. ?  ?_________________________________________________________ ?  ?Nodule 5: 1.8 x 1.4 x 1.8 cm heterogeneous isoechoic region in the ?thyroid isthmus is favored to be a pseudo nodule given lack of ?well-defined margins. ?  ?_________________________________________________________ ?  ?Nodule 6: 1.3 x 1.2 x 1.3 cm solid isoechoic nodule in the isthmus ?is not meet criteria for FNA or imaging follow-up. ?  ?_________________________________________________________ ?  ?Nodule 7: Mixed solid cystic nodule measuring 2.9 x 1.7 x 2.7 cm in ?the inferior isthmus does not meet criteria for FNA or imaging ?follow-up. ?  ?IMPRESSION: ?1. Dominant left thyroid nodule, previously biopsied in 2010 and ?2016 is not significantly changed in size or appearance. Please ?correlate with prior FNA results. ?2. New TI-RADS 3 isoechoic nodule, labeled nodule 1, located in the ?right inferior thyroid lobe, measuring 2.1 x 1.1 x 1.2 cm meets ?criteria for imaging follow-up. Annual ultrasound surveillance is ?recommended until 5 years of stability is  documented. ? ?ASSESSMENT/PLAN/RECOMMENDATIONS:  ? ?Multinodular Goiter: ? ?-Patient with no local neck symptoms ?-She is clinically euthyroid ?-She has a large 5.9 cm left thyroid nodule, this nodule was biopsied in 2010

## 2021-08-10 DIAGNOSIS — M7062 Trochanteric bursitis, left hip: Secondary | ICD-10-CM | POA: Diagnosis not present

## 2021-08-10 DIAGNOSIS — M67912 Unspecified disorder of synovium and tendon, left shoulder: Secondary | ICD-10-CM | POA: Diagnosis not present

## 2021-08-10 NOTE — Progress Notes (Signed)
Cancelled by patient

## 2021-08-18 ENCOUNTER — Other Ambulatory Visit: Payer: Medicare PPO

## 2021-09-03 ENCOUNTER — Ambulatory Visit
Admission: RE | Admit: 2021-09-03 | Discharge: 2021-09-03 | Disposition: A | Discharge status: Home / Self Care | Payer: Medicare PPO | Source: Ambulatory Visit | Attending: Internal Medicine | Admitting: Internal Medicine

## 2021-09-03 ENCOUNTER — Other Ambulatory Visit (HOSPITAL_COMMUNITY)
Admission: RE | Admit: 2021-09-03 | Discharge: 2021-09-03 | Disposition: A | Discharge status: Home / Self Care | Payer: Medicare PPO | Source: Ambulatory Visit | Attending: Interventional Radiology | Admitting: Interventional Radiology

## 2021-09-03 DIAGNOSIS — E041 Nontoxic single thyroid nodule: Secondary | ICD-10-CM | POA: Diagnosis not present

## 2021-09-03 DIAGNOSIS — E042 Nontoxic multinodular goiter: Secondary | ICD-10-CM

## 2021-09-04 LAB — CYTOLOGY - NON PAP

## 2021-09-30 ENCOUNTER — Other Ambulatory Visit: Payer: Self-pay | Admitting: Internal Medicine

## 2021-10-10 ENCOUNTER — Other Ambulatory Visit: Payer: Self-pay | Admitting: Internal Medicine

## 2021-11-18 ENCOUNTER — Encounter: Payer: Self-pay | Admitting: Internal Medicine

## 2021-11-20 ENCOUNTER — Ambulatory Visit: Payer: Medicare PPO | Admitting: Internal Medicine

## 2021-11-20 ENCOUNTER — Encounter: Payer: Self-pay | Admitting: Internal Medicine

## 2021-11-20 VITALS — BP 118/78 | HR 73 | Temp 98.2°F | Ht 67.0 in | Wt 196.4 lb

## 2021-11-20 DIAGNOSIS — I10 Essential (primary) hypertension: Secondary | ICD-10-CM

## 2021-11-20 DIAGNOSIS — L2089 Other atopic dermatitis: Secondary | ICD-10-CM | POA: Diagnosis not present

## 2021-11-20 DIAGNOSIS — E042 Nontoxic multinodular goiter: Secondary | ICD-10-CM | POA: Diagnosis not present

## 2021-11-20 MED ORDER — BETAMETHASONE DIPROPIONATE AUG 0.05 % EX CREA: TOPICAL_CREAM | Freq: Two times a day (BID) | CUTANEOUS | 0 refills | Status: DC

## 2021-11-20 NOTE — Patient Instructions (Addendum)
Apply twice daily for 7 days  Okay to take antihistamine such as Allegra as needed  Call if not gradually better   Check the  blood pressure regularly BP GOAL is between 110/65 and  135/85. If it is consistently higher or lower, let me know    Leah Cameron, PLEASE SCHEDULE YOUR APPOINTMENTS Come back for a physical by December

## 2021-11-20 NOTE — Progress Notes (Unsigned)
Subjective:    Patient ID: Leah Cameron, female    DOB: 03/14/1951, 71 y.o.   MRN: [id number]  DOS:  11/20/2021 Type of visit - description: Acute  Developed a pruritic rash on the left side of the neck 3 weeks ago.  The only thing that she did new was using shampoo in the hotel for few days. She is using no new necklaces. No other areas are affected.  No fever or chills.  Had a recent thyroid biopsy, results reviewed.  HTN: Good med compliance.  Review of Systems See above   Past Medical History:  Diagnosis Date   Allergic rhinitis    Back pain, chronic    Fibromyalgia    sees Dr.Davenshwar   GERD (gastroesophageal reflux disease)    HPV (human papilloma virus) infection    Hypertension    Menopause    on lexapro was started by gyn   RLS (restless legs syndrome)    Scoliosis    Stricture esophagus    last EGD and dilatation 3/09   Syncope 4/11   admitted thought to be due to over treatment of hypertension   Thyromegaly    Vertigo    went to ED on 10/30    Past Surgical History:  Procedure Laterality Date   BACK SURGERY  09/05/2013   cage and rods L4 L5 (@ [facility])   BIOPSY THYROID  01/2009   negative    BREAST BIOPSY Left 09/06/2017   BREAST EXCISIONAL BIOPSY Left 2000   BREAST LUMPECTOMY  2000   COLONOSCOPY     KNEE ARTHROSCOPY  2003   right   KNEE SURGERY  1999/2002   left    SHOULDER SURGERY  2004   TONSILLECTOMY     TRIGGER FINGER RELEASE  08/2014   R THUMB    Current Outpatient Medications  Medication Instructions   amLODipine (NORVASC) 5 mg, Oral, Daily   Ascorbic Acid (VITAMIN C PO) 1 tablet, Daily   azelastine (ASTELIN) 0.1 % nasal spray 2 sprays, Each Nare, 2 times daily   benazepril (LOTENSIN) 40 MG tablet TAKE 1 TABLET EVERY DAY   Calcium Carbonate-Vitamin D 600-400 MG-UNIT chew tablet 1 tablet, 2 times daily   Cyanocobalamin (VITAMIN B 12 PO) Oral   escitalopram (LEXAPRO) 10 MG tablet TAKE 1 TABLET EVERY DAY   fluticasone  (FLONASE) 50 MCG/ACT nasal spray 2 sprays, Each Nare, Daily   Multiple Vitamin (MULTIVITAMIN WITH MINERALS) TABS 1 tablet, Daily   spironolactone (ALDACTONE) 25 mg, Oral, Daily   zolpidem (AMBIEN) 10 mg, Oral, At bedtime PRN, for sleep       Objective:   Physical Exam BP 118/78   Pulse 73   Temp 98.2 F (36.8 C) (Oral)   Ht '5\' 7"'$  (1.702 m)   Wt 196 lb 6.4 oz (89.1 kg)   LMP  (LMP Unknown)   SpO2 98%   BMI 30.76 kg/m  General:   Well developed, NAD, BMI noted. HEENT:  Normocephalic . Face symmetric, atraumatic Skin: See picture from the left neck.    Neurologic:  alert & oriented X3.  Speech normal, gait appropriate for age and unassisted Psych--  Cognition and judgment appear intact.  Cooperative with normal attention span and concentration.  Behavior appropriate. No anxious or depressed appearing.       Assessment     Assessment HTN --hypokalemia, ?primary hyperaldosteronism Anxiety, insomnia --- start to rx ambien here ~ 04-2015 (previously by rheumatology) Multinodular goiter, Dr. Dwyane Dee, seen [phone]  ---  01-2009 thyroid Bx (-), BX again 11-2014.  [place] 08/2019: Unchanged  GI: --GERD  --esophageal stricture, dilatation  2009 MSK: --fibromyalgia >>> stop seen Dr Herold Harms by the end of 2016 b/c she felt  better  --Chronic back pain -- resolved after 2015 back surgery --RLS -- Osteopenia: DEXA  11/28/13 Tscore -2.7; DEXA 09/09/16 T score -2.4 Syncope 2011 (? due to over rx of HTN) Self dc ASA 2016 d/t easy bruising   PLAN:   Atopic dermatitis: Never had listers, has pruritus, suspect atopic dermatitis.  OTC failed, recommend potent topical steroid x2, continue Benadryl or try Allegra.  Call if not gradually better HTN: Good compliance with Aldactone, Lotensin, amlodipine.  Last BMP okay.  No change Multinodular goiter: Seen by Endo 08/07/2021.  Was Rx to repeat FNA (5.9 cm L mid thyroid nodule).  BX benign Missed 5-monthfollow-up, she is doing well thus recommend  RTC December for a physical exam

## 2021-11-21 NOTE — Assessment & Plan Note (Signed)
Atopic dermatitis: Never had listers, has pruritus, suspect atopic dermatitis.  OTC failed, recommend potent topical steroid x2, continue Benadryl or try Allegra.  Call if not gradually better HTN: Good compliance with Aldactone, Lotensin, amlodipine.  Last BMP okay.  No change Multinodular goiter: Seen by Endo 08/07/2021.  Was Rx to repeat FNA (5.9 cm L mid thyroid nodule).  BX benign  Missed 47-monthfollow-up, Leah Cameron is doing well thus recommend RTC December for a physical exam

## 2021-11-23 ENCOUNTER — Encounter: Payer: Self-pay | Admitting: Internal Medicine

## 2021-12-05 ENCOUNTER — Other Ambulatory Visit: Payer: Self-pay | Admitting: Internal Medicine

## 2021-12-15 ENCOUNTER — Other Ambulatory Visit: Payer: Self-pay | Admitting: Internal Medicine

## 2021-12-15 DIAGNOSIS — Z1231 Encounter for screening mammogram for malignant neoplasm of breast: Secondary | ICD-10-CM

## 2021-12-16 ENCOUNTER — Ambulatory Visit
Admission: RE | Admit: 2021-12-16 | Discharge: 2021-12-16 | Disposition: A | Discharge status: Home / Self Care | Payer: Medicare PPO | Source: Ambulatory Visit | Attending: Internal Medicine | Admitting: Internal Medicine

## 2021-12-16 DIAGNOSIS — Z1231 Encounter for screening mammogram for malignant neoplasm of breast: Secondary | ICD-10-CM

## 2021-12-18 ENCOUNTER — Other Ambulatory Visit: Payer: Self-pay | Admitting: Internal Medicine

## 2021-12-18 DIAGNOSIS — R928 Other abnormal and inconclusive findings on diagnostic imaging of breast: Secondary | ICD-10-CM

## 2021-12-25 ENCOUNTER — Ambulatory Visit
Admission: RE | Admit: 2021-12-25 | Discharge: 2021-12-25 | Disposition: A | Discharge status: Home / Self Care | Payer: Medicare PPO | Source: Ambulatory Visit | Attending: Internal Medicine | Admitting: Internal Medicine

## 2021-12-25 ENCOUNTER — Other Ambulatory Visit: Payer: Self-pay | Admitting: Internal Medicine

## 2021-12-25 DIAGNOSIS — R928 Other abnormal and inconclusive findings on diagnostic imaging of breast: Secondary | ICD-10-CM

## 2021-12-25 DIAGNOSIS — N6310 Unspecified lump in the right breast, unspecified quadrant: Secondary | ICD-10-CM

## 2021-12-30 ENCOUNTER — Other Ambulatory Visit: Payer: Self-pay | Admitting: Internal Medicine

## 2021-12-30 LAB — LIPID PANEL
Cholesterol: 159 (ref 0–200)
HDL: 44 (ref 35–70)
LDL Cholesterol: 97
Triglycerides: 99 (ref 40–160)

## 2021-12-30 LAB — HEMOGLOBIN A1C: Hemoglobin A1C: 6.3

## 2021-12-30 LAB — BASIC METABOLIC PANEL: Glucose: 103

## 2021-12-30 NOTE — Telephone Encounter (Signed)
Requesting: Ambien '10mg'$   Contract:04/08/21 UDS: None Last Visit: 11/20/21 Next Visit: 03/23/22 Last Refill: 05/13/21 #90 and 0RF  Please Advise

## 2021-12-30 NOTE — Telephone Encounter (Signed)
PDMP reviewed, too early to refill.

## 2022-01-14 ENCOUNTER — Encounter: Payer: Self-pay | Admitting: Internal Medicine

## 2022-01-24 ENCOUNTER — Telehealth: Payer: Self-pay | Admitting: Internal Medicine

## 2022-01-25 NOTE — Telephone Encounter (Signed)
PDMP reviewed, prescription sent 

## 2022-01-25 NOTE — Telephone Encounter (Signed)
Requesting: Ambien '10mg'$   Contract: 04/08/21 UDS: None Last Visit: 11/20/21 Next Visit: 03/23/22 Last Refill: 05/13/21 #90 and 1RF   Please Advise

## 2022-02-12 ENCOUNTER — Ambulatory Visit: Payer: Medicare PPO | Admitting: Internal Medicine

## 2022-02-16 ENCOUNTER — Encounter: Payer: Self-pay | Admitting: Internal Medicine

## 2022-02-17 ENCOUNTER — Ambulatory Visit: Payer: Medicare PPO | Admitting: Internal Medicine

## 2022-02-17 ENCOUNTER — Encounter: Payer: Self-pay | Admitting: Internal Medicine

## 2022-02-17 VITALS — BP 110/70 | HR 78 | Ht 67.0 in | Wt 201.0 lb

## 2022-02-17 DIAGNOSIS — E042 Nontoxic multinodular goiter: Secondary | ICD-10-CM

## 2022-02-17 LAB — TSH: TSH: 0.51 u[IU]/mL (ref 0.35–5.50)

## 2022-02-17 LAB — T4, FREE: Free T4: 0.79 ng/dL (ref 0.60–1.60)

## 2022-02-17 NOTE — Progress Notes (Signed)
Name: Leah Cameron  MRN/ DOB: [id number], 06-Jun-1950    Age/ Sex: 71 y.o., female    PCP: Colon Branch, MD   Reason for Endocrinology Evaluation: MNG     Date of Initial Endocrinology Evaluation: 02/17/2022     HPI: [name] is a 71 y.o. female with a past medical history of HTN, multinodular goiter, and fibromyalgia. The patient presented for initial endocrinology clinic visit on 02/17/2022 for consultative assistance with her MNG.   Patient has been diagnosed with multinodular goiter since 2010.  She is status post FNA of the left thyroid nodule in October 2010 with benign cytology, at the time the nodule was 2.2 cm.  This nodule has had another benign FNA in 2016 with nodule size 2.0 cm at the time.    Thyroid ultrasound dated April 08, 2021 , the left nodule has enlarged to 5.9 cm, which was biopsied 09/03/2021 with benign cytology  No prior exposure to XRT   Mother with thyroid disease   SUBJECTIVE:    Today (02/17/22):  [name] is here for a follow up on MNG.   Weight has been stable  Denies local neck swelling   dysphagia has resolved, hx of esophogeal dilatation  Denies palpitations  Denies loose stools/diarrhea  Denies tremors  She does have fatigue, she has been on Ambien for years        HISTORY:  Past Medical History:  Past Medical History:  Diagnosis Date   Allergic rhinitis    Back pain, chronic    Fibromyalgia    sees Dr.Davenshwar   GERD (gastroesophageal reflux disease)    HPV (human papilloma virus) infection    Hypertension    Menopause    on lexapro was started by gyn   RLS (restless legs syndrome)    Scoliosis    Stricture esophagus    last EGD and dilatation 3/09   Syncope 4/11   admitted thought to be due to over treatment of hypertension   Thyromegaly    Vertigo    went to ED on 10/30   Past Surgical History:  Past Surgical History:  Procedure Laterality Date   BACK SURGERY   09/05/2013   cage and rods L4 L5 (@ [facility])   BIOPSY THYROID  01/2009   negative    BREAST BIOPSY Left 09/06/2017   BREAST EXCISIONAL BIOPSY Left 2000   BREAST LUMPECTOMY  2000   COLONOSCOPY     KNEE ARTHROSCOPY  2003   right   KNEE SURGERY  1999/2002   left    SHOULDER SURGERY  2004   TONSILLECTOMY     TRIGGER FINGER RELEASE  08/2014   R THUMB    Social History:  reports that she has never smoked. She has never used smokeless tobacco. She reports current alcohol use. She reports that she does not use drugs. Family History: family history includes Breast cancer in an other family member; Coronary artery disease in an other family member; Dementia in her sister; Diabetes in an other family member; Lung cancer in an other family member; Stroke in her mother and sister; Thyroid disease in her mother.   HOME MEDICATIONS: Allergies as of 02/17/2022       Reactions   Cyclobenzaprine Hcl Other (See Comments)   Face swelling   Chlorzoxazone Rash, Swelling   Hydrocodone-acetaminophen Rash   Penicillins Swelling, Rash   Tolerated cefazolin   Tramadol Hcl Itching, Rash  Medication List        Accurate as of February 17, 2022 11:20 AM. If you have any questions, ask your nurse or doctor.          amLODipine 5 MG tablet Commonly known as: NORVASC TAKE 1 TABLET EVERY DAY   augmented betamethasone dipropionate 0.05 % cream Commonly known as: DIPROLENE-AF Apply topically 2 (two) times daily.   azelastine 0.1 % nasal spray Commonly known as: ASTELIN Place 2 sprays into both nostrils 2 (two) times daily.   benazepril 40 MG tablet Commonly known as: LOTENSIN TAKE 1 TABLET EVERY DAY   Calcium Carbonate-Vitamin D 600-400 MG-UNIT chew tablet Chew 1 tablet by mouth 2 (two) times daily.   escitalopram 10 MG tablet Commonly known as: LEXAPRO TAKE 1 TABLET EVERY DAY   fluticasone 50 MCG/ACT nasal spray Commonly known as: FLONASE Place 2 sprays into both nostrils  daily.   multivitamin with minerals Tabs tablet Take 1 tablet by mouth daily.   spironolactone 25 MG tablet Commonly known as: ALDACTONE TAKE 1 TABLET EVERY DAY   VITAMIN B 12 PO Take by mouth.   VITAMIN C PO Take 1 tablet by mouth daily.   zolpidem 10 MG tablet Commonly known as: AMBIEN TAKE 1 TABLET AT BEDTIME AS NEEDED FOR SLEEP          REVIEW OF SYSTEMS: A comprehensive ROS was conducted with the patient and is negative except as per HPI     OBJECTIVE:  VS: BP 110/70 (BP Location: Left Arm, Patient Position: Sitting, Cuff Size: Large)   Pulse 78   Ht '5\' 7"'$  (1.702 m)   Wt 201 lb (91.2 kg)   LMP  (LMP Unknown)   SpO2 99%   BMI 31.48 kg/m    Wt Readings from Last 3 Encounters:  02/17/22 201 lb (91.2 kg)  11/20/21 196 lb 6.4 oz (89.1 kg)  08/07/21 201 lb (91.2 kg)     EXAM: General: Pt appears well and is in NAD  Neck: General: Supple without adenopathy. Thyroid: Left thyroid nodule appreciated.   Lungs: Clear with good BS bilat with no rales, rhonchi, or wheezes  Heart: Auscultation: RRR.  Abdomen: Normoactive bowel sounds, soft, nontender, without masses or organomegaly palpable  Extremities:  BL LE: No pretibial edema normal ROM and strength.  Mental Status: Judgment, insight: Intact Mood and affect: No depression, anxiety, or agitation     DATA REVIEWED:   Latest Reference Range & Units 02/17/22 11:45  TSH 0.35 - 5.50 uIU/mL 0.51  T4,Free(Direct) 0.60 - 1.60 ng/dL 0.79    Thyroid Ultrasound 04/08/2021   Estimated total number of nodules >/= 1 cm: 6   Number of spongiform nodules >/=  2 cm not described below (TR1): 0   Number of mixed cystic and solid nodules >/= 1.5 cm not described below ([place]): 0   _________________________________________________________   Nodule # 1:   Location: Right; inferior   Maximum size: 2.1 cm; Other 2 dimensions: 1.1 x 1.2 cm   Composition: solid/almost completely solid (2)   Echogenicity:  isoechoic (1)   Shape: not taller-than-wide (0)   Margins: ill-defined (0)   Echogenic foci: none (0)   ACR TI-RADS total points: 3.   ACR TI-RADS risk category: TR3 (3 points).   ACR TI-RADS recommendations:   *Given size (>/= 1.5 - 2.4 cm) and appearance, a follow-up ultrasound in 1 year should be considered based on TI-RADS criteria.   _________________________________________________________   Nodule 2: Predominantly cystic 1.2 x  0.8 x 1.0 cm right mid thyroid nodule does not meet criteria for imaging surveillance or FNA.   _________________________________________________________   Nodule 3: Subcentimeter isoechoic right inferior thyroid nodule does not meet criteria for FNA or imaging follow-up.   _________________________________________________________   Nodule 4: 5.9 x 3.1 x 4.0 cm nodule occupying the majority of the left thyroid lobe does not appear significantly changed since prior examination. Prior FNA of this nodule was performed in 2010 and 2016. Please correlate with prior results.   _________________________________________________________   Nodule 5: 1.8 x 1.4 x 1.8 cm heterogeneous isoechoic region in the thyroid isthmus is favored to be a pseudo nodule given lack of well-defined margins.   _________________________________________________________   Nodule 6: 1.3 x 1.2 x 1.3 cm solid isoechoic nodule in the isthmus is not meet criteria for FNA or imaging follow-up.   _________________________________________________________   Nodule 7: Mixed solid cystic nodule measuring 2.9 x 1.7 x 2.7 cm in the inferior isthmus does not meet criteria for FNA or imaging follow-up.   IMPRESSION: 1. Dominant left thyroid nodule, previously biopsied in 2010 and 2016 is not significantly changed in size or appearance. Please correlate with prior FNA results. 2. New TI-RADS 3 isoechoic nodule, labeled nodule 1, located in the right inferior thyroid lobe,  measuring 2.1 x 1.1 x 1.2 cm meets criteria for imaging follow-up. Annual ultrasound surveillance is recommended until 5 years of stability is documented.    FNA 09/03/2021  Clinical History: Nodule 4: 5.9 x 3.1 x 4.0 cm nodule occupying the majority of the left thyroid lobe does not appear significantly changes since prior examination. Prior FNA of this nodule was performed in 2010 and 08/01/2014. Please correlate with prior results. Specimen Submitted:  A. THYROID, LT LOBE NODULE #4, FINE NEEDLE ASPIRATION:   FINAL MICROSCOPIC DIAGNOSIS: - Consistent with benign follicular nodule (Bethesda category II)      ASSESSMENT/PLAN/RECOMMENDATIONS:   Multinodular Goiter:  -Patient with no local neck symptoms -She is clinically euthyroid -She has a large 5.9 cm left thyroid nodule, this nodule was biopsied in 2010 with benign cytology at 2.2 cm in diameter at the time.  This was again biopsied with benign cytology at 2.0 cm in diameter in 2016.  This has increased now to 5.9 cm which was biopsied 08/2021 with benign cytology  - We have opted to continue to monitor but has low threshold for surgical intervention should she started to have local symptoms - TFT's are normal    Follow-up in 4 months  Signed electronically by: Mack Guise, MD  [facility]  [address] [place], [place] [address] Phone: [phone] FAX: [phone]   CC: Colon Branch, [address] [place] [address] Phone: [phone] Fax: [phone]   Return to Endocrinology clinic as below: Future Appointments  Date Time Provider [place]  03/23/2022  2:00 PM Colon Branch, MD [facility]  06/30/2022 10:00 AM [facility] DIAG [place] 1 [facility]  06/30/2022 10:10 AM [facility] [place] 1 [facility]

## 2022-02-23 ENCOUNTER — Encounter: Payer: Self-pay | Admitting: Internal Medicine

## 2022-02-23 ENCOUNTER — Ambulatory Visit (HOSPITAL_BASED_OUTPATIENT_CLINIC_OR_DEPARTMENT_OTHER)
Admission: RE | Admit: 2022-02-23 | Discharge: 2022-02-23 | Disposition: A | Discharge status: Home / Self Care | Payer: Medicare PPO | Source: Ambulatory Visit | Attending: Internal Medicine | Admitting: Internal Medicine

## 2022-02-23 DIAGNOSIS — E042 Nontoxic multinodular goiter: Secondary | ICD-10-CM | POA: Diagnosis not present

## 2022-03-22 ENCOUNTER — Ambulatory Visit (AMBULATORY_SURGERY_CENTER): Payer: Medicare PPO | Admitting: *Deleted

## 2022-03-22 VITALS — Ht 67.0 in | Wt 201.0 lb

## 2022-03-22 DIAGNOSIS — Z1211 Encounter for screening for malignant neoplasm of colon: Secondary | ICD-10-CM

## 2022-03-22 MED ORDER — NA SULFATE-K SULFATE-MG SULF 17.5-3.13-1.6 GM/177ML PO SOLN: 1.0000 | Freq: Once | ORAL | 0 refills | Status: AC

## 2022-03-22 NOTE — Progress Notes (Signed)
right ear acheNo egg or soy allergy known to patient  No issues known to pt with past sedation with any surgeries or procedures Patient denies ever being told they had issues or difficulty with intubation  No FH of Malignant Hyperthermia Pt is not on diet pills Pt is not on  home 02  Pt is not on blood thinners  Pt denies issues with constipation  No A fib or A flutter Have any cardiac testing pending--no Pt instructed to use Singlecare.[email or web address] or GoodRx for a price reduction on prep    Patient's chart reviewed by Leah Cameron CNRA prior to previsit and patient appropriate for the [place].  Previsit completed and red dot placed by patient's name on their procedure day (on provider's schedule).

## 2022-03-23 ENCOUNTER — Ambulatory Visit (INDEPENDENT_AMBULATORY_CARE_PROVIDER_SITE_OTHER): Payer: Medicare PPO | Admitting: Internal Medicine

## 2022-03-23 ENCOUNTER — Encounter: Payer: Self-pay | Admitting: Internal Medicine

## 2022-03-23 VITALS — BP 140/88 | HR 77 | Temp 98.8°F | Resp 16 | Ht 67.0 in | Wt 201.4 lb

## 2022-03-23 DIAGNOSIS — Z0000 Encounter for general adult medical examination without abnormal findings: Secondary | ICD-10-CM

## 2022-03-23 DIAGNOSIS — R739 Hyperglycemia, unspecified: Secondary | ICD-10-CM | POA: Diagnosis not present

## 2022-03-23 DIAGNOSIS — I10 Essential (primary) hypertension: Secondary | ICD-10-CM | POA: Diagnosis not present

## 2022-03-23 NOTE — Patient Instructions (Addendum)
Vaccines: RSV   Check the  blood pressure regularly BP GOAL is between 110/65 and  135/85. If it is consistently higher or lower, let me know  Zolpidem: Try to take only half a sleeping pill at bedtime and take the other half only if needed   GO TO THE LAB : Get the blood work     [place], [place] back for a checkup in 6 months    Do you have a "Living will" or "[place] of attorney"? (Advance care planning documents)  If you already have a living will or healthcare power of attorney, is recommended you bring the copy to be scanned in your chart. The document will be available to all the doctors you see in the system.  If you don't have one, please consider create one.   More information at:  [email or web address]

## 2022-03-23 NOTE — Progress Notes (Unsigned)
Subjective:    Patient ID: Leah Cameron, female    DOB: [birth year], 71 y.o.   MRN: [id number]  DOS:  03/23/2022 Type of visit - description: Here for CPX  Here for CPX In general feels very well. In the last couple of days had some runny nose.  No other URI symptoms. BP slightly elevated today, normal when she goes to other offices.  BP Readings from Last 3 Encounters:  03/23/22 (!) 146/92  02/17/22 110/70  11/20/21 118/78     Review of Systems See above   Past Medical History:  Diagnosis Date   Allergic rhinitis    Arthritis    foot hands,legs   Back pain, chronic    Cancer (HCC)    breast,left   Fibromyalgia    sees Dr.Davenshwar   GERD (gastroesophageal reflux disease)    HPV (human papilloma virus) infection    Hypertension    Menopause    on lexapro was started by gyn   Osteopenia    RLS (restless legs syndrome)    Scoliosis    Stricture esophagus    last EGD and dilatation 3/09   Syncope 07/2009   admitted thought to be due to over treatment of hypertension   Thyromegaly    Vertigo    went to ED on 10/30    Past Surgical History:  Procedure Laterality Date   BACK SURGERY  09/05/2013   cage and rods L4 L5 (@ [facility])   BIOPSY THYROID  01/2009   negative    BREAST BIOPSY Left 09/06/2017   BREAST EXCISIONAL BIOPSY Left 2000   BREAST LUMPECTOMY  2000   BUNIONECTOMY Bilateral    COLONOSCOPY     KNEE ARTHROSCOPY  2003   right x1   KNEE SURGERY Left 1999/2002   left  x2   SHOULDER SURGERY  2004   TONSILLECTOMY     TRIGGER FINGER RELEASE  08/2014   R THUMB    Current Outpatient Medications  Medication Instructions   amLODipine (NORVASC) 5 mg, Oral, Daily   Ascorbic Acid (VITAMIN C PO) 1 tablet, Daily   augmented betamethasone dipropionate (DIPROLENE-AF) 0.05 % cream Topical, 2 times daily   azelastine (ASTELIN) 0.1 % nasal spray 2 sprays, Each Nare, 2 times daily   benazepril (LOTENSIN) 40 MG tablet TAKE 1 TABLET EVERY DAY    Calcium Carbonate-Vitamin D 600-400 MG-UNIT chew tablet 1 tablet, 2 times daily   Cyanocobalamin (VITAMIN B 12 PO) Take by mouth.   escitalopram (LEXAPRO) 10 MG tablet TAKE 1 TABLET EVERY DAY   fluticasone (FLONASE) 50 MCG/ACT nasal spray 2 sprays, Each Nare, Daily   Multiple Vitamin (MULTIVITAMIN WITH MINERALS) TABS 1 tablet, Daily   spironolactone (ALDACTONE) 25 mg, Oral, Daily   zolpidem (AMBIEN) 10 mg, Oral, At bedtime PRN, for sleep       Objective:   Physical Exam BP (!) 146/92   Pulse 77   Temp 98.8 F (37.1 C) (Oral)   Resp 16   Ht '5\' 7"'$  (1.702 m)   Wt 201 lb 6 oz (91.3 kg)   LMP  (LMP Unknown)   SpO2 97%   BMI 31.54 kg/m  General: Well developed, NAD, BMI noted Neck: + Thyromegaly  HEENT:  Normocephalic . Face symmetric, atraumatic. Throat: Symmetric, normal rate, no white patches Ears: Mostly obscured by wax Lungs:  CTA B Normal respiratory effort, no intercostal retractions, no accessory muscle use. Heart: RRR,  no murmur.  Abdomen:  Not distended, soft, non-tender. No  rebound or rigidity.   Lower extremities: no pretibial edema bilaterally  Skin: Exposed areas without rash. Not pale. Not jaundice Neurologic:  alert & oriented X3.  Speech normal, gait appropriate for age and unassisted Strength symmetric and appropriate for age.  Psych: Cognition and judgment appear intact.  Cooperative with normal attention span and concentration.  Behavior appropriate. No anxious or depressed appearing.     Assessment    Assessment Prediabetes HTN --hypokalemia, ?primary hyperaldosteronism Anxiety, insomnia --- start to rx ambien here ~ 04-2015 (previously by rheumatology) Multinodular goiter, Dr. Dwyane Dee, seen [phone]  ---01-2009 thyroid Bx (-), BX again 11-2014.  [place] 08/2019: Unchanged  GI: --GERD  --esophageal stricture, dilatation  2009 MSK: --fibromyalgia >>> stop seen Dr Herold Harms by the end of 2016 b/c she felt  better  --Chronic back pain -- resolved after  2015 back surgery --RLS -- Osteopenia: DEXA  11/28/13 Tscore -2.7; DEXA 09/09/16 T score -2.4; Dexa 2021 Syncope 2011 (? due to over rx of HTN) Self dc ASA 2016 d/t easy bruising   PLAN: Here for CPX Prediabetes: Last A1c slightly elevated.  She has prediabetes, she is aware, diet and exercise discussed. HTN: BP today slightly elevated, typically normal when she goes to other offices, continue amlodipine, Lotensin, Aldactone.  Monitor at home.  Checking labs. Anxiety insomnia: Controlled on Lexapro, recommend to decrease Ambien if possible.  She could take half tablet nightly and then additional half tablet if she wakes up in the middle of the night. Goiter: Follow-up by Endo Atopic dermatitis: See last visit, symptoms resolved Osteopenia: Has a bone density test scheduled for tomorrow RTC 6 months  --Td 2019 - pnm 13: 2018; pnm 23: 06/2018 - s/p Zostavax and Shingrix - RSV d/w pt  - COVID vax done: 02/2022 - Had a flu shot  -- Female care: Sees GYN regularly, [facility]G 12-2021 (K PN) -- CCS: Colonoscopy:  01/10/2002 and 10- 2013, neg, next cscope scheduled -- Labs : Reviewed, check a CMP CBC A1c - Patient education: Diet and exercise. -POA information provided        Atopic dermatitis: Never had listers, has pruritus, suspect atopic dermatitis.  OTC failed, recommend potent topical steroid x2, continue Benadryl or try Allegra.  Call if not gradually better HTN: Good compliance with Aldactone, Lotensin, amlodipine.  Last BMP okay.  No change Multinodular goiter: Seen by Endo 08/07/2021.  Was Rx to repeat FNA (5.9 cm L mid thyroid nodule).  BX benign  Missed 12-monthfollow-up, she is doing well thus recommend RTC December for a physical exam

## 2022-03-24 ENCOUNTER — Encounter: Payer: Self-pay | Admitting: Internal Medicine

## 2022-03-24 LAB — CBC WITH DIFFERENTIAL/PLATELET
Basophils Absolute: 0.1 10*3/uL (ref 0.0–0.1)
Basophils Relative: 1.5 % (ref 0.0–3.0)
Eosinophils Absolute: 0.2 10*3/uL (ref 0.0–0.7)
Eosinophils Relative: 3.1 % (ref 0.0–5.0)
HCT: 37.9 % (ref 36.0–46.0)
Hemoglobin: 12 g/dL (ref 12.0–15.0)
Lymphocytes Relative: 31.6 % (ref 12.0–46.0)
Lymphs Abs: 1.9 10*3/uL (ref 0.7–4.0)
MCHC: 31.7 g/dL (ref 30.0–36.0)
MCV: 83.8 fl (ref 78.0–100.0)
Monocytes Absolute: 0.4 10*3/uL (ref 0.1–1.0)
Monocytes Relative: 7.3 % (ref 3.0–12.0)
Neutro Abs: 3.5 10*3/uL (ref 1.4–7.7)
Neutrophils Relative %: 56.5 % (ref 43.0–77.0)
Platelets: 147 10*3/uL — ABNORMAL LOW (ref 150.0–400.0)
RBC: 4.53 Mil/uL (ref 3.87–5.11)
RDW: 15.1 % (ref 11.5–15.5)
WBC: 6.2 10*3/uL (ref 4.0–10.5)

## 2022-03-24 LAB — COMPREHENSIVE METABOLIC PANEL
ALT: 18 U/L (ref 0–35)
AST: 18 U/L (ref 0–37)
Albumin: 4.5 g/dL (ref 3.5–5.2)
Alkaline Phosphatase: 87 U/L (ref 39–117)
BUN: 19 mg/dL (ref 6–23)
CO2: 29 mEq/L (ref 19–32)
Calcium: 10.2 mg/dL (ref 8.4–10.5)
Chloride: 101 mEq/L (ref 96–112)
Creatinine, Ser: 0.95 mg/dL (ref 0.40–1.20)
GFR: 60.68 mL/min (ref >60.00)
Glucose, Bld: 76 mg/dL (ref 70–99)
Potassium: 4.1 mEq/L (ref 3.5–5.1)
Sodium: 137 mEq/L (ref 135–145)
Total Bilirubin: 0.3 mg/dL (ref 0.2–1.2)
Total Protein: 7 g/dL (ref 6.0–8.3)

## 2022-03-24 LAB — HEMOGLOBIN A1C: Hgb A1c MFr Bld: 6.4 % (ref 4.6–6.5)

## 2022-03-24 NOTE — Assessment & Plan Note (Signed)
--  Td 2019 - pnm 13: 2018; pnm 23: 06/2018 - s/p Zostavax and Shingrix - RSV d/w pt  - COVID vax done: 02/2022 - Had a flu shot  -- Female care: Sees GYN regularly, [facility] 12-2021 (K PN) -- CCS: Colonoscopy:  01/10/2002 and 01-2012, neg, next cscope scheduled -- Labs : Reviewed, check a CMP CBC A1c - Patient education: Diet and exercise. -POA information provided

## 2022-03-24 NOTE — Assessment & Plan Note (Signed)
Here for CPX Prediabetes: Last A1c slightly elevated.  She has prediabetes, she is aware, diet and exercise discussed. HTN: BP today slightly elevated, typically normal when she goes to other offices, continue amlodipine, Lotensin, Aldactone.  Monitor at home.  Checking labs. Anxiety insomnia: Controlled on Lexapro, recommend to decrease Ambien if possible.  She could take half tablet nightly and then additional half tablet if she wakes up in the middle of the night. Goiter: Follow-up by Endo Atopic dermatitis: See last visit, symptoms resolved Osteopenia: Has a bone density test scheduled for tomorrow RTC 6 months

## 2022-03-26 DIAGNOSIS — M85852 Other specified disorders of bone density and structure, left thigh: Secondary | ICD-10-CM | POA: Diagnosis not present

## 2022-03-26 DIAGNOSIS — Z780 Asymptomatic menopausal state: Secondary | ICD-10-CM | POA: Diagnosis not present

## 2022-03-29 ENCOUNTER — Encounter: Payer: Self-pay | Admitting: Internal Medicine

## 2022-04-20 ENCOUNTER — Encounter: Payer: Self-pay | Admitting: Internal Medicine

## 2022-04-21 ENCOUNTER — Telehealth: Payer: Self-pay | Admitting: Internal Medicine

## 2022-04-21 NOTE — Telephone Encounter (Signed)
LMOM to call office back.

## 2022-04-21 NOTE — Telephone Encounter (Signed)
Spoke with pt.  I instructed her to purchase a 32 oz bottle of Gatorade (not red or purple) and a 119 gram bottle of Miralax OTC.  Mix in a pitcher and put into refrigerator.  At 5:00 pm she is to drink 1 8 oz glass every 15 minutes until done.  She has been on clear liquids all day.  She will do the second part of Suprep as instructed at 5:00 am tomorrow, NPO at 7:00 am and arrive at [facility] at 9:00 am

## 2022-04-21 NOTE — Telephone Encounter (Signed)
Patient called stated she took er prep at 5 am this morning misunderstood the rep instructions is scheduled for her colonoscopy tomorrow seeking further advise.

## 2022-04-22 ENCOUNTER — Encounter: Payer: Medicare PPO | Admitting: Internal Medicine

## 2022-04-22 ENCOUNTER — Ambulatory Visit (AMBULATORY_SURGERY_CENTER): Payer: Medicare PPO | Admitting: Internal Medicine

## 2022-04-22 ENCOUNTER — Encounter: Payer: Self-pay | Admitting: Internal Medicine

## 2022-04-22 VITALS — BP 130/72 | HR 60 | Temp 96.0°F | Resp 13 | Ht 67.0 in | Wt 201.0 lb

## 2022-04-22 DIAGNOSIS — K5939 Other megacolon: Secondary | ICD-10-CM | POA: Diagnosis not present

## 2022-04-22 DIAGNOSIS — D123 Benign neoplasm of transverse colon: Secondary | ICD-10-CM | POA: Diagnosis not present

## 2022-04-22 DIAGNOSIS — D122 Benign neoplasm of ascending colon: Secondary | ICD-10-CM

## 2022-04-22 DIAGNOSIS — Z1211 Encounter for screening for malignant neoplasm of colon: Secondary | ICD-10-CM

## 2022-04-22 DIAGNOSIS — K6389 Other specified diseases of intestine: Secondary | ICD-10-CM | POA: Diagnosis not present

## 2022-04-22 MED ORDER — SODIUM CHLORIDE 0.9 % IV SOLN: 500.0000 mL | Freq: Once | INTRAVENOUS | Status: DC

## 2022-04-22 NOTE — Progress Notes (Signed)
GASTROENTEROLOGY PROCEDURE H&P NOTE   Primary Care Physician: Colon Branch, MD    Reason for Procedure:   Colon cancer screening  Plan:    Colonoscopy  Patient is appropriate for endoscopic procedure(s) in the ambulatory ([facility]) setting.  The nature of the procedure, as well as the risks, benefits, and alternatives were carefully and thoroughly reviewed with the patient. Ample time for discussion and questions allowed. The patient understood, was satisfied, and agreed to proceed.     HPI: Leah Cameron is a 72 y.o. female who presents for colonoscopy for colon cancer screening. Denies blood in stools, changes in bowel habits, or unintentional weight loss. Denies family history of colon cancer.  Past Medical History:  Diagnosis Date   Allergic rhinitis    Arthritis    foot hands,legs   Back pain, chronic    Cancer (HCC)    breast,left   Fibromyalgia    sees Dr.Davenshwar   GERD (gastroesophageal reflux disease)    HPV (human papilloma virus) infection    Hypertension    Menopause    on lexapro was started by gyn   Osteopenia    RLS (restless legs syndrome)    Scoliosis    Stricture esophagus    last EGD and dilatation 3/09   Syncope 07/2009   admitted thought to be due to over treatment of hypertension   Thyromegaly    Vertigo    went to ED on 10/30    Past Surgical History:  Procedure Laterality Date   BACK SURGERY  09/05/2013   cage and rods L4 L5 (@ [facility])   BIOPSY THYROID  01/2009   negative    BREAST BIOPSY Left 09/06/2017   BREAST EXCISIONAL BIOPSY Left 2000   BREAST LUMPECTOMY  2000   BUNIONECTOMY Bilateral    COLONOSCOPY     KNEE ARTHROSCOPY  2003   right x1   KNEE SURGERY Left 1999/2002   left  x2   SHOULDER SURGERY  2004   TONSILLECTOMY     TRIGGER FINGER RELEASE  08/2014   R THUMB    Prior to Admission medications   Medication Sig Start Date End Date Taking? Authorizing Provider  amLODipine (NORVASC) 5 MG tablet TAKE 1  TABLET EVERY DAY 12/06/21  Yes Dutch Quint B, FNP  Ascorbic Acid (VITAMIN C PO) Take 1 tablet by mouth daily.   Yes [provider]  benazepril (LOTENSIN) 40 MG tablet TAKE 1 TABLET EVERY DAY 10/12/21  Yes Paz, Alda Berthold, MD  Calcium Carbonate-Vitamin D 600-400 MG-UNIT chew tablet Chew 1 tablet by mouth 2 (two) times daily.    Yes [provider]  escitalopram (LEXAPRO) 10 MG tablet TAKE 1 TABLET EVERY DAY 09/30/21  Yes Colon Branch, MD  spironolactone (ALDACTONE) 25 MG tablet TAKE 1 TABLET EVERY DAY 12/06/21  Yes Dutch Quint B, FNP  zolpidem (AMBIEN) 10 MG tablet TAKE 1 TABLET AT BEDTIME AS NEEDED FOR SLEEP 01/25/22  Yes Paz, Alda Berthold, MD  augmented betamethasone dipropionate (DIPROLENE-AF) 0.05 % cream Apply topically 2 (two) times daily. Patient not taking: Reported on 03/22/2022 11/20/21   Colon Branch, MD  azelastine (ASTELIN) 0.1 % nasal spray Place 2 sprays into both nostrils 2 (two) times daily. Patient taking differently: Place 2 sprays into both nostrils as needed. 09/10/20   Colon Branch, MD  Cyanocobalamin (VITAMIN B 12 PO) Take by mouth. Patient not taking: Reported on 03/22/2022    [provider]  fluticasone (FLONASE) 50  MCG/ACT nasal spray Place 2 sprays into both nostrils daily. Patient taking differently: Place 2 sprays into both nostrils as needed. 03/30/18   Orma Flaming, MD  Multiple Vitamin (MULTIVITAMIN WITH MINERALS) TABS Take 1 tablet by mouth daily. Patient not taking: Reported on 04/22/2022    [provider]    Current Outpatient Medications  Medication Sig Dispense Refill   amLODipine (NORVASC) 5 MG tablet TAKE 1 TABLET EVERY DAY 90 tablet 1   Ascorbic Acid (VITAMIN C PO) Take 1 tablet by mouth daily.     benazepril (LOTENSIN) 40 MG tablet TAKE 1 TABLET EVERY DAY 90 tablet 1   Calcium Carbonate-Vitamin D 600-400 MG-UNIT chew tablet Chew 1 tablet by mouth 2 (two) times daily.      escitalopram (LEXAPRO) 10 MG tablet TAKE 1 TABLET EVERY  DAY 90 tablet 0   spironolactone (ALDACTONE) 25 MG tablet TAKE 1 TABLET EVERY DAY 90 tablet 1   zolpidem (AMBIEN) 10 MG tablet TAKE 1 TABLET AT BEDTIME AS NEEDED FOR SLEEP 90 tablet 0   augmented betamethasone dipropionate (DIPROLENE-AF) 0.05 % cream Apply topically 2 (two) times daily. (Patient not taking: Reported on 03/22/2022) 60 g 0   azelastine (ASTELIN) 0.1 % nasal spray Place 2 sprays into both nostrils 2 (two) times daily. (Patient taking differently: Place 2 sprays into both nostrils as needed.) 30 mL 6   Cyanocobalamin (VITAMIN B 12 PO) Take by mouth. (Patient not taking: Reported on 03/22/2022)     fluticasone (FLONASE) 50 MCG/ACT nasal spray Place 2 sprays into both nostrils daily. (Patient taking differently: Place 2 sprays into both nostrils as needed.) 16 g 6   Multiple Vitamin (MULTIVITAMIN WITH MINERALS) TABS Take 1 tablet by mouth daily. (Patient not taking: Reported on 04/22/2022)     Current Facility-Administered Medications  Medication Dose Route Frequency Provider Last Rate Last Admin   0.9 %  sodium chloride infusion  500 mL Intravenous Once Sharyn Creamer, MD        Allergies as of 04/22/2022 - Review Complete 04/22/2022  Allergen Reaction Noted   Cyclobenzaprine hcl Other (See Comments) 08/16/2007   Chlorzoxazone Rash and Swelling 02/13/2015   Hydrocodone-acetaminophen Rash 09/18/2013   Penicillins Swelling and Rash 08/16/2007   Tramadol hcl Itching and Rash 08/16/2007    Family History  Problem Relation Age of Onset   Stroke Mother        M, F, 1/2 sister, sister    Thyroid disease Mother    Stroke Sister    Dementia Sister    Breast cancer Other        1/2 sister   Diabetes Other        aunt   Coronary artery disease Other        GF late in life   Lung cancer Other        cousin   Colon cancer Neg Hx    Colon polyps Neg Hx    Crohn's disease Neg Hx    Esophageal cancer Neg Hx    Rectal cancer Neg Hx    Stomach cancer Neg Hx    Ulcerative colitis  Neg Hx     Social History   Socioeconomic History   Marital status: Married    Spouse name: Not on file   Number of children: 2   Years of education: Not on file   Highest education level: Not on file  Occupational History   Occupation:  Retired 2001, Public relations account executive, disable  Tobacco Use   Smoking status: Never    Passive exposure: Never   Smokeless tobacco: Never  Vaping Use   Vaping Use: Never used  Substance and Sexual Activity   Alcohol use: Yes    Comment: socially    Drug use: No   Sexual activity: Not on file  Other Topics Concern   Not on file  Social History Narrative   Retired    3 g-kids   Keeps a g-child on-off         Social Determinants of Radio broadcast assistant Strain: Low Risk  (06/09/2021)   Overall Financial Resource Strain (CARDIA)    Difficulty of Paying Living Expenses: Not hard at all  Food Insecurity: No Food Insecurity (06/09/2021)   Hunger Vital Sign    Worried About Running Out of Food in the Last Year: Never true    [place] in the Last Year: Never true  Transportation Needs: No Transportation Needs (06/09/2021)   PRAPARE - Hydrologist (Medical): No    Lack of Transportation (Non-Medical): No  Physical Activity: Inactive (06/09/2021)   Exercise Vital Sign    Days of Exercise per Week: 0 days    Minutes of Exercise per Session: 0 min  Stress: No Stress Concern Present (06/09/2021)   [place]    Feeling of Stress : Not at all  Social Connections: [place] (06/09/2021)   Social Connection and Isolation Panel [NHANES]    Frequency of Communication with Friends and Family: More than three times a week    Frequency of Social Gatherings with Friends and Family: More than three times a week    Attends Religious Services: 1 to 4 times per year    Active Member of [employer] or Organizations: Yes    Attends Archivist  Meetings: 1 to 4 times per year    Marital Status: Married  Human resources officer Violence: Not At Risk (06/09/2021)   Humiliation, Afraid, Rape, and Kick questionnaire    Fear of Current or Ex-Partner: No    Emotionally Abused: No    Physically Abused: No    Sexually Abused: No    Physical Exam: Vital signs in last 24 hours: BP 136/81   Pulse 65   Temp (!) 96 F (35.6 C)   Ht '5\' 7"'$  (1.702 m)   Wt 201 lb (91.2 kg)   LMP  (LMP Unknown)   SpO2 99%   BMI 31.48 kg/m  GEN: NAD EYE: Sclerae anicteric ENT: MMM CV: Non-tachycardic Pulm: No increased work of breathing GI: Soft, NT/ND NEURO:  Alert & Oriented   Christia Reading, MD [facility]  04/22/2022 9:18 AM

## 2022-04-22 NOTE — Progress Notes (Signed)
To pacu, VSS. Report to Rn.tb 

## 2022-04-22 NOTE — Progress Notes (Signed)
Called to room to assist during endoscopic procedure.  Patient ID and intended procedure confirmed with present staff. Received instructions for my participation in the procedure from the performing physician.  

## 2022-04-22 NOTE — Op Note (Signed)
[facility] Patient Name: Leah Cameron Procedure Date: 04/22/2022 9:28 AM MRN: [id number] Endoscopist: Georgian Co , , [id number] Age: 72 Referring MD:  Date of Birth: 09-29-1950 Gender: Female Account #: [id number] Procedure:                Colonoscopy Indications:              Screening for colorectal malignant neoplasm Medicines:                Monitored Anesthesia Care Procedure:                Pre-Anesthesia Assessment:                           - Prior to the procedure, a History and Physical                            was performed, and patient medications and                            allergies were reviewed. The patient's tolerance of                            previous anesthesia was also reviewed. The risks                            and benefits of the procedure and the sedation                            options and risks were discussed with the patient.                            All questions were answered, and informed consent                            was obtained. Prior Anticoagulants: The patient has                            taken no anticoagulant or antiplatelet agents. ASA                            Grade Assessment: II - A patient with mild systemic                            disease. After reviewing the risks and benefits,                            the patient was deemed in satisfactory condition to                            undergo the procedure.                           After obtaining informed consent, the colonoscope  was passed under direct vision. Throughout the                            procedure, the patient's blood pressure, pulse, and                            oxygen saturations were monitored continuously. The                            [facility] [id number] was introduced through                            the anus and advanced to the the terminal ileum.                            The  colonoscopy was performed without difficulty.                            The patient tolerated the procedure well. The                            quality of the bowel preparation was good. The                            terminal ileum, ileocecal valve, appendiceal                            orifice, and rectum were photographed. Scope In: 9:35:58 AM Scope Out: 9:59:47 AM Scope Withdrawal Time: 0 hours 18 minutes 26 seconds  Total Procedure Duration: 0 hours 23 minutes 49 seconds  Findings:                 The terminal ileum appeared normal.                           A 3 mm polyp was found in the ascending colon. The                            polyp was sessile. The polyp was removed with a                            cold snare. Resection and retrieval were complete.                           A 2 mm polyp was found in the transverse colon. The                            polyp was sessile. The polyp was removed with a                            cold biopsy forceps. Resection and retrieval were  complete.                           A 4 mm polyp was found in the descending colon. The                            polyp was sessile. The polyp was removed with a                            cold snare. Resection and retrieval were complete.                           Multiple diverticula were found in the sigmoid                            colon and descending colon.                           Non-bleeding internal hemorrhoids were found during                            retroflexion. Complications:            No immediate complications. Estimated Blood Loss:     Estimated blood loss was minimal. Impression:               - The examined portion of the ileum was normal.                           - One 3 mm polyp in the ascending colon, removed                            with a cold snare. Resected and retrieved.                           - One 2 mm polyp in the transverse  colon, removed                            with a cold biopsy forceps. Resected and retrieved.                           - One 4 mm polyp in the descending colon, removed                            with a cold snare. Resected and retrieved.                           - Diverticulosis in the sigmoid colon and in the                            descending colon.                           - Non-bleeding internal hemorrhoids. Recommendation:           -  Discharge patient to home (with escort).                           - Await pathology results.                           - The findings and recommendations were discussed                            with the patient. Dr Georgian Co "[place]" Lorenso Courier,  04/22/2022 10:08:15 AM

## 2022-04-22 NOTE — Patient Instructions (Signed)
YOU HAD AN ENDOSCOPIC PROCEDURE TODAY AT THE [facility]:   Refer to the procedure report that was given to you for any specific questions about what was found during the examination.  If the procedure report does not answer your questions, please call your gastroenterologist to clarify.  If you requested that your care partner not be given the details of your procedure findings, then the procedure report has been included in a sealed envelope for you to review at your convenience later.  YOU SHOULD EXPECT: Some feelings of bloating in the abdomen. Passage of more gas than usual.  Walking can help get rid of the air that was put into your GI tract during the procedure and reduce the bloating. If you had a lower endoscopy (such as a colonoscopy or flexible sigmoidoscopy) you may notice spotting of blood in your stool or on the toilet paper. If you underwent a bowel prep for your procedure, you may not have a normal bowel movement for a few days.  Please Note:  You might notice some irritation and congestion in your nose or some drainage.  This is from the oxygen used during your procedure.  There is no need for concern and it should clear up in a day or so.  SYMPTOMS TO REPORT IMMEDIATELY:  Following lower endoscopy (colonoscopy or flexible sigmoidoscopy):  Excessive amounts of blood in the stool  Significant tenderness or worsening of abdominal pains  Swelling of the abdomen that is new, acute  Fever of 100F or higher  For urgent or emergent issues, a gastroenterologist can be reached at any hour by calling [phone]. Do not use MyChart messaging for urgent concerns.    DIET:  We do recommend a small meal at first, but then you may proceed to your regular diet.  Drink plenty of fluids but you should avoid alcoholic beverages for 24 hours.  ACTIVITY:  You should plan to take it easy for the rest of today and you should NOT DRIVE or use heavy machinery until tomorrow (because of  the sedation medicines used during the test).    FOLLOW UP: Our staff will call the number listed on your records the next business day following your procedure.  We will call around 7:15- 8:00 am to check on you and address any questions or concerns that you may have regarding the information given to you following your procedure. If we do not reach you, we will leave a message.     If any biopsies were taken you will be contacted by phone or by letter within the next 1-3 weeks.  Please call us at [phone] if you have not heard about the biopsies in 3 weeks.    SIGNATURES/CONFIDENTIALITY: You and/or your care partner have signed paperwork which will be entered into your electronic medical record.  These signatures attest to the fact that that the information above on your After Visit Summary has been reviewed and is understood.  Full responsibility of the confidentiality of this discharge information lies with you and/or your care-partner.  

## 2022-04-23 ENCOUNTER — Telehealth: Payer: Self-pay

## 2022-04-23 ENCOUNTER — Encounter: Payer: Medicare PPO | Admitting: Internal Medicine

## 2022-04-23 NOTE — Telephone Encounter (Signed)
  Follow up Call-     04/22/2022    9:12 AM  Call back number  Post procedure Call Back phone  # [phone]  Permission to leave phone message Yes     Patient questions:  Do you have a fever, pain , or abdominal swelling? No. Pain Score  0 *  Have you tolerated food without any problems? Yes.    Have you been able to return to your normal activities? Yes.    Do you have any questions about your discharge instructions: Diet   No. Medications  No. Follow up visit  No.  Do you have questions or concerns about your Care? No.  Actions: * If pain score is 4 or above: No action needed, pain <4.

## 2022-04-29 ENCOUNTER — Encounter: Payer: Self-pay | Admitting: Internal Medicine

## 2022-05-17 ENCOUNTER — Other Ambulatory Visit: Payer: Self-pay

## 2022-05-17 DIAGNOSIS — H40023 Open angle with borderline findings, high risk, bilateral: Secondary | ICD-10-CM | POA: Diagnosis not present

## 2022-05-17 DIAGNOSIS — H43821 Vitreomacular adhesion, right eye: Secondary | ICD-10-CM | POA: Diagnosis not present

## 2022-05-17 DIAGNOSIS — H04123 Dry eye syndrome of bilateral lacrimal glands: Secondary | ICD-10-CM | POA: Diagnosis not present

## 2022-05-17 DIAGNOSIS — H2513 Age-related nuclear cataract, bilateral: Secondary | ICD-10-CM | POA: Diagnosis not present

## 2022-05-17 MED ORDER — ESCITALOPRAM OXALATE 10 MG PO TABS: 10.0000 mg | ORAL_TABLET | Freq: Every day | ORAL | 1 refills | Status: DC

## 2022-06-03 DIAGNOSIS — E041 Nontoxic single thyroid nodule: Secondary | ICD-10-CM | POA: Diagnosis not present

## 2022-06-03 DIAGNOSIS — Z124 Encounter for screening for malignant neoplasm of cervix: Secondary | ICD-10-CM | POA: Diagnosis not present

## 2022-06-03 DIAGNOSIS — Z01419 Encounter for gynecological examination (general) (routine) without abnormal findings: Secondary | ICD-10-CM | POA: Diagnosis not present

## 2022-06-03 DIAGNOSIS — Z6831 Body mass index (BMI) 31.0-31.9, adult: Secondary | ICD-10-CM | POA: Diagnosis not present

## 2022-06-03 DIAGNOSIS — D229 Melanocytic nevi, unspecified: Secondary | ICD-10-CM | POA: Diagnosis not present

## 2022-06-07 ENCOUNTER — Telehealth: Payer: Self-pay | Admitting: Internal Medicine

## 2022-06-07 ENCOUNTER — Ambulatory Visit (HOSPITAL_BASED_OUTPATIENT_CLINIC_OR_DEPARTMENT_OTHER)
Admission: RE | Admit: 2022-06-07 | Discharge: 2022-06-07 | Disposition: A | Discharge status: Home / Self Care | Payer: Medicare PPO | Source: Ambulatory Visit | Attending: Internal Medicine | Admitting: Internal Medicine

## 2022-06-07 DIAGNOSIS — E042 Nontoxic multinodular goiter: Secondary | ICD-10-CM

## 2022-06-07 NOTE — Telephone Encounter (Signed)
Patient advise and will contact [facility] to schedule appointment

## 2022-06-07 NOTE — Telephone Encounter (Signed)
Patient is calling saying that the nodule has enlarged.  Patient requested sooner appointment than her scheduled 02/18/2023 appointment.  Patient was scheduled for 06/18/2022, but she is requesting an appointment for this week.

## 2022-06-08 ENCOUNTER — Encounter: Payer: Self-pay | Admitting: Internal Medicine

## 2022-06-15 ENCOUNTER — Ambulatory Visit: Payer: Medicare PPO

## 2022-06-18 ENCOUNTER — Ambulatory Visit: Payer: Medicare PPO | Admitting: Internal Medicine

## 2022-06-30 ENCOUNTER — Other Ambulatory Visit: Payer: Self-pay | Admitting: Internal Medicine

## 2022-06-30 ENCOUNTER — Ambulatory Visit
Admission: RE | Admit: 2022-06-30 | Discharge: 2022-06-30 | Disposition: A | Discharge status: Home / Self Care | Payer: Medicare PPO | Source: Ambulatory Visit | Attending: Internal Medicine | Admitting: Internal Medicine

## 2022-06-30 ENCOUNTER — Telehealth: Payer: Self-pay | Admitting: Internal Medicine

## 2022-06-30 DIAGNOSIS — N6310 Unspecified lump in the right breast, unspecified quadrant: Secondary | ICD-10-CM

## 2022-06-30 DIAGNOSIS — N6313 Unspecified lump in the right breast, lower outer quadrant: Secondary | ICD-10-CM | POA: Diagnosis not present

## 2022-06-30 DIAGNOSIS — R928 Other abnormal and inconclusive findings on diagnostic imaging of breast: Secondary | ICD-10-CM | POA: Diagnosis not present

## 2022-06-30 DIAGNOSIS — N6311 Unspecified lump in the right breast, upper outer quadrant: Secondary | ICD-10-CM | POA: Diagnosis not present

## 2022-06-30 NOTE — Telephone Encounter (Signed)
Copied from [place] [id number]. Topic: Medicare AWV >> Jun 30, 2022 10:07 AM Devoria Glassing wrote: Reason for CRM: Called patient to schedule Medicare Annual Wellness Visit (AWV). Left message for patient to call back and schedule Medicare Annual Wellness Visit (AWV).   Last date of AWV: 07/07/2021   Please schedule an appointment at any time with Kirke Shaggy, LPN after [id number] on AWV schedule. .  If any questions, please contact me.  Thank you , Sherol Dade; [place] Direct Dial: [phone]

## 2022-06-30 NOTE — Telephone Encounter (Signed)
Copied from [place] [id number]. Topic: Medicare AWV >> Jun 30, 2022 11:00 AM Devoria Glassing wrote: Reason for CRM: Called patient to schedule Medicare Annual Wellness Visit (AWV). Left message for patient to call back and schedule Medicare Annual Wellness Visit (AWV).  Last date of AWV: 07/07/21  Please schedule an appointment at any time with Beatris Ship, CMA after 07/13/22 on AWV schedule  .  If any questions, please contact me.  Thank you ,  Sherol Dade; [place] Direct Dial: [phone]

## 2022-07-07 DIAGNOSIS — Z808 Family history of malignant neoplasm of other organs or systems: Secondary | ICD-10-CM | POA: Diagnosis not present

## 2022-07-07 DIAGNOSIS — Z86010 Personal history of colonic polyps: Secondary | ICD-10-CM | POA: Diagnosis not present

## 2022-07-07 DIAGNOSIS — Z800 Family history of malignant neoplasm of digestive organs: Secondary | ICD-10-CM | POA: Diagnosis not present

## 2022-07-07 DIAGNOSIS — Z803 Family history of malignant neoplasm of breast: Secondary | ICD-10-CM | POA: Diagnosis not present

## 2022-07-07 DIAGNOSIS — Z809 Family history of malignant neoplasm, unspecified: Secondary | ICD-10-CM | POA: Diagnosis not present

## 2022-07-07 DIAGNOSIS — Z853 Personal history of malignant neoplasm of breast: Secondary | ICD-10-CM | POA: Diagnosis not present

## 2022-07-20 ENCOUNTER — Telehealth: Payer: Self-pay | Admitting: Internal Medicine

## 2022-07-20 NOTE — Telephone Encounter (Signed)
Copied from CRM [id number]. Topic: Medicare AWV >> Jul 20, 2022  2:00 PM Payton Doughty wrote: Reason for CRM: Called patient to schedule Medicare Annual Wellness Visit (AWV). No voicemail available to leave a message.  Last date of AWV: 06/09/2021   Please schedule an appointment at any time with Kandis Cocking, LPN .  If any questions, please contact me.  Thank you ,  Verlee Rossetti; Care Guide Ambulatory Clinical Support [place] [facility] Direct Dial: [phone]

## 2022-07-29 ENCOUNTER — Telehealth: Payer: Self-pay | Admitting: Internal Medicine

## 2022-07-29 NOTE — Telephone Encounter (Signed)
Contacted Leah Cameron to schedule their annual wellness visit. Appointment made for 08/11/2022.  Leah Cameron; Care Guide Ambulatory Clinical Support [place] [facility] Direct Dial: [phone]

## 2022-08-04 ENCOUNTER — Other Ambulatory Visit: Payer: Self-pay | Admitting: Internal Medicine

## 2022-08-11 ENCOUNTER — Ambulatory Visit (INDEPENDENT_AMBULATORY_CARE_PROVIDER_SITE_OTHER): Payer: Medicare PPO | Admitting: *Deleted

## 2022-08-11 VITALS — Ht 67.0 in | Wt 200.0 lb

## 2022-08-11 DIAGNOSIS — Z0000 Encounter for general adult medical examination without abnormal findings: Secondary | ICD-10-CM | POA: Diagnosis not present

## 2022-08-11 NOTE — Progress Notes (Signed)
Subjective:  Pt completed ADLs, Fall risk, and SDOH during e-check in on 08/10/22.  Answers verified with pt.    Leah Cameron is a 72 y.o. female who presents for Medicare Annual (Subsequent) preventive examination.  I connected with  Leah Cameron on 08/11/22 by a audio enabled telemedicine application and verified that I am speaking with the correct person using two identifiers.  Patient Location: Home  Provider Location: Office/Clinic  I discussed the limitations of evaluation and management by telemedicine. The patient expressed understanding and agreed to proceed.   Review of Systems     Cardiac Risk Factors include: advanced age (>8men, >69 women);hypertension;obesity (BMI >30kg/m2)     Objective:    Today's Vitals   08/11/22 1423  Weight: 200 lb (90.7 kg)  Height: 5\' 7"  (1.702 m)   Body mass index is 31.32 kg/m.     08/11/2022    2:22 PM 06/09/2021    1:13 PM  Advanced Directives  Does Patient Have a Medical Advance Directive? No No  Would patient like information on creating a medical advance directive? No - Patient declined Yes (MAU/Ambulatory/Procedural Areas - Information given)    Current Medications (verified) Outpatient Encounter Medications as of 08/11/2022  Medication Sig   amLODipine (NORVASC) 5 MG tablet TAKE 1 TABLET EVERY DAY   Ascorbic Acid (VITAMIN C PO) Take 1 tablet by mouth daily.   augmented betamethasone dipropionate (DIPROLENE-AF) 0.05 % cream Apply topically 2 (two) times daily. (Patient not taking: Reported on 03/22/2022)   azelastine (ASTELIN) 0.1 % nasal spray Place 2 sprays into both nostrils 2 (two) times daily. (Patient taking differently: Place 2 sprays into both nostrils as needed.)   benazepril (LOTENSIN) 40 MG tablet Take 1 tablet (40 mg total) by mouth daily.   Calcium Carbonate-Vitamin D 600-400 MG-UNIT chew tablet Chew 1 tablet by mouth 2 (two) times daily.    Cyanocobalamin (VITAMIN B 12 PO) Take by  mouth. (Patient not taking: Reported on 03/22/2022)   escitalopram (LEXAPRO) 10 MG tablet Take 1 tablet (10 mg total) by mouth daily.   fluticasone (FLONASE) 50 MCG/ACT nasal spray Place 2 sprays into both nostrils daily. (Patient taking differently: Place 2 sprays into both nostrils as needed.)   Multiple Vitamin (MULTIVITAMIN WITH MINERALS) TABS Take 1 tablet by mouth daily. (Patient not taking: Reported on 04/22/2022)   spironolactone (ALDACTONE) 25 MG tablet TAKE 1 TABLET EVERY DAY   zolpidem (AMBIEN) 10 MG tablet TAKE 1 TABLET AT BEDTIME AS NEEDED FOR SLEEP   No facility-administered encounter medications on file as of 08/11/2022.    Allergies (verified) Cyclobenzaprine hcl, Chlorzoxazone, Hydrocodone-acetaminophen, Penicillins, and Tramadol hcl   History: Past Medical History:  Diagnosis Date   Allergic rhinitis    Allergy Penicillin   Arthritis    foot hands,legs   Back pain, chronic    Cancer (HCC)    breast,left   Fibromyalgia    sees Dr.Davenshwar   GERD (gastroesophageal reflux disease)    HPV (human papilloma virus) infection    Hypertension    Menopause    on lexapro was started by gyn   Osteopenia    RLS (restless legs syndrome)    Scoliosis    Stricture esophagus    last EGD and dilatation 3/09   Syncope 07/2009   admitted thought to be due to over treatment of hypertension   Thyromegaly    Vertigo    went to ED on 10/30   Past Surgical History:  Procedure Laterality  Date   BACK SURGERY  09/05/2013   cage and rods L4 L5 (@ [facility])   BIOPSY THYROID  01/2009   negative    BREAST BIOPSY Left 09/06/2017   BREAST EXCISIONAL BIOPSY Left 2000   BREAST LUMPECTOMY  2000   BUNIONECTOMY Bilateral    COLONOSCOPY     KNEE ARTHROSCOPY  2003   right x1   KNEE SURGERY Left 1999/2002   left  x2   SHOULDER SURGERY  2004   SPINE SURGERY  08/2013   TONSILLECTOMY     TRIGGER FINGER RELEASE  08/2014   R THUMB   TUBAL LIGATION  06/1979   Family History  Problem  Relation Age of Onset   Stroke Mother        M, F, 1/2 sister, sister    Thyroid disease Mother    Arthritis Mother    Hypertension Mother    Hypertension Father    Stroke Father    Stroke Sister    Dementia Sister    Early death Brother    Early death Brother    Breast cancer Other        1/2 sister   Diabetes Other        aunt   Coronary artery disease Other        GF late in life   Lung cancer Other        cousin   Heart disease Paternal Grandfather    Early death Brother    Hypertension Sister    Miscarriages / [place] Daughter    Colon cancer Neg Hx    Colon polyps Neg Hx    Crohn's disease Neg Hx    Esophageal cancer Neg Hx    Rectal cancer Neg Hx    Stomach cancer Neg Hx    Ulcerative colitis Neg Hx    Social History   Socioeconomic History   Marital status: Married    Spouse name: Not on file   Number of children: 2   Years of education: Not on file   Highest education level: Not on file  Occupational History   Occupation:  Retired 2001, Psychologist, forensic, disable    Tobacco Use   Smoking status: Never    Passive exposure: Never   Smokeless tobacco: Never  Vaping Use   Vaping Use: Never used  Substance and Sexual Activity   Alcohol use: Yes    Comment: socially    Drug use: No   Sexual activity: Not on file  Other Topics Concern   Not on file  Social History Narrative   Retired    3 g-kids   Keeps a g-child on-off         Social Determinants of Corporate investment banker Strain: Low Risk  (08/10/2022)   Overall Financial Resource Strain (CARDIA)    Difficulty of Paying Living Expenses: Not hard at all  Food Insecurity: No Food Insecurity (08/10/2022)   Hunger Vital Sign    Worried About Running Out of Food in the Last Year: Never true    Ran Out of Food in the Last Year: Never true  Transportation Needs: No Transportation Needs (08/10/2022)   PRAPARE - Administrator, Civil Service (Medical): No    Lack of Transportation  (Non-Medical): No  Physical Activity: Insufficiently Active (08/10/2022)   Exercise Vital Sign    Days of Exercise per Week: 1 day    Minutes of Exercise per Session: 60 min  Stress: Stress  Concern Present (08/10/2022)   [employer] of Occupational Health - Occupational Stress Questionnaire    Feeling of Stress : To some extent  Social Connections: Unknown (08/10/2022)   Social Connection and Isolation Panel [NHANES]    Frequency of Communication with Friends and Family: More than three times a week    Frequency of Social Gatherings with Friends and Family: Twice a week    Attends Religious Services: Not on Marketing executive or Organizations: Yes    Attends Banker Meetings: 1 to 4 times per year    Marital Status: Married    Tobacco Counseling Counseling given: Not Answered   Clinical Intake:  Pre-visit preparation completed: Yes  Pain : No/denies pain  BMI - recorded: 31.32 Nutritional Status: BMI > 30  Obese Nutritional Risks: None Diabetes: No  How often do you need to have someone help you when you read instructions, pamphlets, or other written materials from your doctor or pharmacy?: 1 - Never   Activities of Daily Living    08/10/2022    8:29 PM  In your present state of health, do you have any difficulty performing the following activities:  Hearing? 0  Vision? 0  Difficulty concentrating or making decisions? 0  Walking or climbing stairs? 0  Dressing or bathing? 0  Doing errands, shopping? 0  Preparing Food and eating ? N  Using the Toilet? N  In the past six months, have you accidently leaked urine? Y  Do you have problems with loss of bowel control? N  Managing your Medications? N  Managing your Finances? N  Housekeeping or managing your Housekeeping? N    Patient Care Team: Wanda Plump, MD as PCP - General Arita Miss, MD as Attending Physician (Nephrology) Tawni Pummel, [place] as Physician Assistant  (Rheumatology) Reather Littler, MD as Consulting Physician (Endocrinology) Osborn Coho, MD as Consulting Physician (Obstetrics and Gynecology)  Indicate any recent Medical Services you may have received from other than [facility] providers in the past year (date may be approximate).     Assessment:   This is a routine wellness examination for Leah Cameron.  Hearing/Vision screen No results found.  Dietary issues and exercise activities discussed: Current Exercise Habits: Home exercise routine, Type of exercise: walking;Other - see comments (stationary bike), Time (Minutes): 60, Frequency (Times/Week): 1, Weekly Exercise (Minutes/Week): 60, Intensity: Mild, Exercise limited by: None identified   Goals Addressed   None    Depression Screen    08/11/2022    2:24 PM 03/23/2022    2:05 PM 11/20/2021    2:27 PM 06/09/2021    1:12 PM 04/08/2021   10:13 AM 09/30/2020    1:04 PM 08/15/2020   11:03 AM  PHQ 2/9 Scores  PHQ - 2 Score 1 0 0 0 0 0 0    Fall Risk    08/10/2022    8:29 PM 03/23/2022    2:05 PM 11/20/2021    2:27 PM 06/09/2021    1:14 PM 04/08/2021   10:13 AM  Fall Risk   Falls in the past year? 0 0 0 0 0  Number falls in past yr: 0 0 0 0 0  Injury with Fall? 0 0 0 0 0  Risk for fall due to : No Fall Risks  No Fall Risks Impaired vision   Follow up Falls evaluation completed Falls evaluation completed Falls evaluation completed Falls prevention discussed Falls evaluation completed    FALL RISK PREVENTION PERTAINING  TO THE HOME:  Any stairs in or around the home? Yes  If so, are there any without handrails? No  Home free of loose throw rugs in walkways, pet beds, electrical cords, etc? Yes  Adequate lighting in your home to reduce risk of falls? Yes   ASSISTIVE DEVICES UTILIZED TO PREVENT FALLS:  Life alert? No  Use of a cane, walker or w/c? No  Grab bars in the bathroom? No  Shower chair or bench in shower?  Built in bench Elevated toilet seat or a handicapped toilet? Yes    TIMED UP AND GO:  Was the test performed?  No, audio visit .    Cognitive Function:        08/11/2022    2:28 PM 06/09/2021    1:16 PM  6CIT Screen  What Year? 0 points 0 points  What month? 0 points 0 points  What time? 0 points 0 points  Count back from 20 0 points 0 points  Months in reverse 0 points 2 points  Repeat phrase 2 points 0 points  Total Score 2 points 2 points    Immunizations Immunization History  Administered Date(s) Administered   Influenza Split 01/18/2011, 04/20/2012, 02/04/2021   Influenza Whole 01/01/2009, 03/20/2010   Influenza, High Dose Seasonal PF 04/09/2018   Influenza,inj,Quad PF,6+ Mos 01/03/2013, 02/28/2014, 01/22/2015   Influenza-Unspecified 01/07/2016, 01/17/2017, 12/26/2018, 12/25/2019, 02/03/2022   PFIZER(Purple Top)SARS-COV-2 Vaccination 05/01/2019, 05/23/2019, 01/09/2020, 11/02/2020   Pfizer Covid-19 Vaccine Bivalent Booster 85yrs & up 03/17/2021, 02/16/2022   Pneumococcal Conjugate-13 02/25/2017   Pneumococcal Polysaccharide-23 06/13/2018   Respiratory Syncytial Virus Vaccine,Recomb Aduvanted(Arexvy) 03/27/2022   Td 10/30/2007, 11/16/2017   Zoster Recombinat (Shingrix) 12/05/2020, 02/04/2021   Zoster, Live 08/08/2013    TDAP status: Up to date  Flu Vaccine status: Up to date  Pneumococcal vaccine status: Up to date  Covid-19 vaccine status: Information provided on how to obtain vaccines.   Qualifies for Shingles Vaccine? Yes   Zostavax completed Yes   Shingrix Completed?: Yes  Screening Tests Health Maintenance  Topic Date Due   Medicare Annual Wellness (AWV)  06/09/2022   COVID-19 Vaccine (7 - 2023-24 season) 12/08/2022 (Originally 04/13/2022)   INFLUENZA VACCINE  11/11/2022   MAMMOGRAM  12/17/2022   COLONOSCOPY (Pts 45-30yrs Insurance coverage will need to be confirmed)  04/23/2027   DTaP/Tdap/Td (3 - Tdap) 11/17/2027   Pneumonia Vaccine 18+ Years old  Completed   DEXA SCAN  Completed   Hepatitis C Screening   Completed   Zoster Vaccines- Shingrix  Completed   HPV VACCINES  Aged Out    Health Maintenance  Health Maintenance Due  Topic Date Due   Medicare Annual Wellness (AWV)  06/09/2022    Colorectal cancer screening: Type of screening: Colonoscopy. Completed 04/22/22. Repeat every 5 years  Mammogram status: Completed 12/16/21. Repeat every year  Bone Density status: Completed 08/29/19. Results reflect: Bone density results: OSTEOPENIA. Repeat every 2 years.  Lung Cancer Screening: (Low Dose CT Chest recommended if Age 42-80 years, 30 pack-year currently smoking OR have quit w/in 15years.) does not qualify.   Additional Screening:  Hepatitis C Screening: does qualify; Completed 11/27/14  Vision Screening: Recommended annual ophthalmology exams for early detection of glaucoma and other disorders of the eye. Is the patient up to date with their annual eye exam?  Yes  Who is the provider or what is the name of the office in which the patient attends annual eye exams? [facility]. If pt is not established with  a provider, would they like to be referred to a provider to establish care? No .   Dental Screening: Recommended annual dental exams for proper oral hygiene  Community Resource Referral / Chronic Care Management: CRR required this visit?  No   CCM required this visit?  No      Plan:     I have personally reviewed and noted the following in the patient's chart:   Medical and social history Use of alcohol, tobacco or illicit drugs  Current medications and supplements including opioid prescriptions. Patient is not currently taking opioid prescriptions. Functional ability and status Nutritional status Physical activity Advanced directives List of other physicians Hospitalizations, surgeries, and ER visits in previous 12 months Vitals Screenings to include cognitive, depression, and falls Referrals and appointments  In addition, I have reviewed and discussed with  patient certain preventive protocols, quality metrics, and best practice recommendations. A written personalized care plan for preventive services as well as general preventive health recommendations were provided to patient.   Due to this being a telephonic visit, the after visit summary with patients personalized plan was offered to patient via mail or my-chart. Patient would like to access on my-chart.  Donne Anon, [place]   08/11/2022   Nurse Notes: None

## 2022-08-11 NOTE — Patient Instructions (Signed)
[name] , Thank you for taking time to come for your Medicare Wellness Visit. I appreciate your ongoing commitment to your health goals. Please review the following plan we discussed and let me know if I can assist you in the future.   These are the goals we discussed:  Goals      Patient Stated     Staying mentally healthy         This is a list of the screening recommended for you and due dates:  Health Maintenance  Topic Date Due   COVID-19 Vaccine (7 - 2023-24 season) 12/08/2022*   Flu Shot  11/11/2022   Mammogram  12/17/2022   Medicare Annual Wellness Visit  08/11/2023   Colon Cancer Screening  04/23/2027   DTaP/Tdap/Td vaccine (3 - Tdap) 11/17/2027   Pneumonia Vaccine  Completed   DEXA scan (bone density measurement)  Completed   Hepatitis C Screening: USPSTF Recommendation to screen - Ages 76-79 yo.  Completed   Zoster (Shingles) Vaccine  Completed   HPV Vaccine  Aged Out  *Topic was postponed. The date shown is not the original due date.     Next appointment: Follow up in one year for your annual wellness visit.   Preventive Care 18 Years and Older, Female Preventive care refers to lifestyle choices and visits with your health care provider that can promote health and wellness. What does preventive care include? A yearly physical exam. This is also called an annual well check. Dental exams once or twice a year. Routine eye exams. Ask your health care provider how often you should have your eyes checked. Personal lifestyle choices, including: Daily care of your teeth and gums. Regular physical activity. Eating a healthy diet. Avoiding tobacco and drug use. Limiting alcohol use. Practicing safe sex. Taking low-dose aspirin every day. Taking vitamin and mineral supplements as recommended by your health care provider. What happens during an annual well check? The services and screenings done by your health care provider during your annual well check will  depend on your age, overall health, lifestyle risk factors, and family history of disease. Counseling  Your health care provider may ask you questions about your: Alcohol use. Tobacco use. Drug use. Emotional well-being. Home and relationship well-being. Sexual activity. Eating habits. History of falls. Memory and ability to understand (cognition). Work and work Astronomer. Reproductive health. Screening  You may have the following tests or measurements: Height, weight, and BMI. Blood pressure. Lipid and cholesterol levels. These may be checked every 5 years, or more frequently if you are over 23 years old. Skin check. Lung cancer screening. You may have this screening every year starting at age 75 if you have a 30-pack-year history of smoking and currently smoke or have quit within the past 15 years. Fecal occult blood test (FOBT) of the stool. You may have this test every year starting at age 43. Flexible sigmoidoscopy or colonoscopy. You may have a sigmoidoscopy every 5 years or a colonoscopy every 10 years starting at age 43. Hepatitis C blood test. Hepatitis B blood test. Sexually transmitted disease (STD) testing. Diabetes screening. This is done by checking your blood sugar (glucose) after you have not eaten for a while (fasting). You may have this done every 1-3 years. Bone density scan. This is done to screen for osteoporosis. You may have this done starting at age 68. Mammogram. This may be done every 1-2 years. Talk to your health care provider about how often you should have regular  mammograms. Talk with your health care provider about your test results, treatment options, and if necessary, the need for more tests. Vaccines  Your health care provider may recommend certain vaccines, such as: Influenza vaccine. This is recommended every year. Tetanus, diphtheria, and acellular pertussis (Tdap, Td) vaccine. You may need a Td booster every 10 years. Zoster vaccine. You may  need this after age 9. Pneumococcal 13-valent conjugate (PCV13) vaccine. One dose is recommended after age 63. Pneumococcal polysaccharide (PPSV23) vaccine. One dose is recommended after age 17. Talk to your health care provider about which screenings and vaccines you need and how often you need them. This information is not intended to replace advice given to you by your health care provider. Make sure you discuss any questions you have with your health care provider. Document Released: 04/25/2015 Document Revised: 12/17/2015 Document Reviewed: 01/28/2015 Elsevier Interactive Patient Education  2017 [place] Prevention in the Home Falls can cause injuries. They can happen to people of all ages. There are many things you can do to make your home safe and to help prevent falls. What can I do on the outside of my home? Regularly fix the edges of walkways and driveways and fix any cracks. Remove anything that might make you trip as you walk through a door, such as a raised step or threshold. Trim any bushes or trees on the path to your home. Use bright outdoor lighting. Clear any walking paths of anything that might make someone trip, such as rocks or tools. Regularly check to see if handrails are loose or broken. Make sure that both sides of any steps have handrails. Any raised decks and porches should have guardrails on the edges. Have any leaves, snow, or ice cleared regularly. Use sand or salt on walking paths during winter. Clean up any spills in your garage right away. This includes oil or grease spills. What can I do in the bathroom? Use night lights. Install grab bars by the toilet and in the tub and shower. Do not use towel bars as grab bars. Use non-skid mats or decals in the tub or shower. If you need to sit down in the shower, use a plastic, non-slip stool. Keep the floor dry. Clean up any water that spills on the floor as soon as it happens. Remove soap buildup in  the tub or shower regularly. Attach bath mats securely with double-sided non-slip rug tape. Do not have throw rugs and other things on the floor that can make you trip. What can I do in the bedroom? Use night lights. Make sure that you have a light by your bed that is easy to reach. Do not use any sheets or blankets that are too big for your bed. They should not hang down onto the floor. Have a firm chair that has side arms. You can use this for support while you get dressed. Do not have throw rugs and other things on the floor that can make you trip. What can I do in the kitchen? Clean up any spills right away. Avoid walking on wet floors. Keep items that you use a lot in easy-to-reach places. If you need to reach something above you, use a strong step stool that has a grab bar. Keep electrical cords out of the way. Do not use floor polish or wax that makes floors slippery. If you must use wax, use non-skid floor wax. Do not have throw rugs and other things on the floor that can  make you trip. What can I do with my stairs? Do not leave any items on the stairs. Make sure that there are handrails on both sides of the stairs and use them. Fix handrails that are broken or loose. Make sure that handrails are as long as the stairways. Check any carpeting to make sure that it is firmly attached to the stairs. Fix any carpet that is loose or worn. Avoid having throw rugs at the top or bottom of the stairs. If you do have throw rugs, attach them to the floor with carpet tape. Make sure that you have a light switch at the top of the stairs and the bottom of the stairs. If you do not have them, ask someone to add them for you. What else can I do to help prevent falls? Wear shoes that: Do not have high heels. Have rubber bottoms. Are comfortable and fit you well. Are closed at the toe. Do not wear sandals. If you use a stepladder: Make sure that it is fully opened. Do not climb a closed  stepladder. Make sure that both sides of the stepladder are locked into place. Ask someone to hold it for you, if possible. Clearly mark and make sure that you can see: Any grab bars or handrails. First and last steps. Where the edge of each step is. Use tools that help you move around (mobility aids) if they are needed. These include: Canes. Walkers. Scooters. Crutches. Turn on the lights when you go into a dark area. Replace any light bulbs as soon as they burn out. Set up your furniture so you have a clear path. Avoid moving your furniture around. If any of your floors are uneven, fix them. If there are any pets around you, be aware of where they are. Review your medicines with your doctor. Some medicines can make you feel dizzy. This can increase your chance of falling. Ask your doctor what other things that you can do to help prevent falls. This information is not intended to replace advice given to you by your health care provider. Make sure you discuss any questions you have with your health care provider. Document Released: 01/23/2009 Document Revised: 09/04/2015 Document Reviewed: 05/03/2014 Elsevier Interactive Patient Education  2017 [employer].

## 2022-08-16 DIAGNOSIS — E042 Nontoxic multinodular goiter: Secondary | ICD-10-CM | POA: Diagnosis not present

## 2022-08-16 DIAGNOSIS — E041 Nontoxic single thyroid nodule: Secondary | ICD-10-CM | POA: Diagnosis not present

## 2022-08-16 DIAGNOSIS — H6123 Impacted cerumen, bilateral: Secondary | ICD-10-CM | POA: Diagnosis not present

## 2022-08-17 ENCOUNTER — Telehealth: Payer: Self-pay | Admitting: Internal Medicine

## 2022-08-18 NOTE — Telephone Encounter (Signed)
PDMP okay, Rx sent 

## 2022-08-18 NOTE — Telephone Encounter (Signed)
Requesting: Ambien 10mg  Contract: 04/08/21 UDS: Ambien only Last Visit: 03/23/22 Next Visit: 09/22/22 Last Refill: 01/25/22 #90 and 0RF  Please Advise

## 2022-08-19 ENCOUNTER — Other Ambulatory Visit: Payer: Self-pay | Admitting: Otolaryngology

## 2022-08-19 DIAGNOSIS — E042 Nontoxic multinodular goiter: Secondary | ICD-10-CM

## 2022-09-15 ENCOUNTER — Encounter (INDEPENDENT_AMBULATORY_CARE_PROVIDER_SITE_OTHER): Payer: Medicare PPO | Admitting: Family Medicine

## 2022-09-15 DIAGNOSIS — Z0289 Encounter for other administrative examinations: Secondary | ICD-10-CM

## 2022-09-22 ENCOUNTER — Ambulatory Visit: Payer: Medicare PPO | Admitting: Internal Medicine

## 2022-09-27 ENCOUNTER — Ambulatory Visit: Payer: Medicare PPO | Admitting: Internal Medicine

## 2022-09-27 ENCOUNTER — Encounter: Payer: Self-pay | Admitting: Internal Medicine

## 2022-09-27 VITALS — BP 132/82 | HR 61 | Temp 98.1°F | Resp 16 | Ht 67.0 in | Wt 199.4 lb

## 2022-09-27 DIAGNOSIS — I10 Essential (primary) hypertension: Secondary | ICD-10-CM

## 2022-09-27 DIAGNOSIS — E049 Nontoxic goiter, unspecified: Secondary | ICD-10-CM | POA: Diagnosis not present

## 2022-09-27 DIAGNOSIS — R739 Hyperglycemia, unspecified: Secondary | ICD-10-CM

## 2022-09-27 LAB — BASIC METABOLIC PANEL
BUN: 16 mg/dL (ref 6–23)
CO2: 27 mEq/L (ref 19–32)
Calcium: 9.5 mg/dL (ref 8.4–10.5)
Chloride: 104 mEq/L (ref 96–112)
Creatinine, Ser: 1 mg/dL (ref 0.40–1.20)
GFR: 56.86 mL/min — ABNORMAL LOW (ref >60.00)
Glucose, Bld: 96 mg/dL (ref 70–99)
Potassium: 4.4 mEq/L (ref 3.5–5.1)
Sodium: 139 mEq/L (ref 135–145)

## 2022-09-27 LAB — HEMOGLOBIN A1C: Hgb A1c MFr Bld: 6.3 % (ref 4.6–6.5)

## 2022-09-27 NOTE — Patient Instructions (Signed)
Check the  blood pressure regularly BP GOAL is between 110/65 and  135/85. If it is consistently higher or lower, let me know     GO TO THE LAB : Get the blood work     GO TO THE FRONT DESK, PLEASE SCHEDULE YOUR APPOINTMENTS Come back for a physical exam by December 2024

## 2022-09-27 NOTE — Progress Notes (Unsigned)
Subjective:    Patient ID: Leah Cameron, female    DOB: 1950-10-07, 72 y.o.   MRN: [id number]  DOS:  09/27/2022 Type of visit - description: f/u  Here for follow-up Today we talk about her chronic medical issues. In general feels well. Saw ENT, note reviewed.  Review of Systems See above   Past Medical History:  Diagnosis Date   Allergic rhinitis    Allergy Penicillin   Arthritis    foot hands,legs   Back pain, chronic    Cancer (HCC)    breast,left   Fibromyalgia    sees Dr.Davenshwar   GERD (gastroesophageal reflux disease)    HPV (human papilloma virus) infection    Hypertension    Menopause    on lexapro was started by gyn   Osteopenia    RLS (restless legs syndrome)    Scoliosis    Stricture esophagus    last EGD and dilatation 3/09   Syncope 07/2009   admitted thought to be due to over treatment of hypertension   Thyromegaly    Vertigo    went to ED on 10/30    Past Surgical History:  Procedure Laterality Date   BACK SURGERY  09/05/2013   cage and rods L4 L5 (@ [facility])   BIOPSY THYROID  01/2009   negative    BREAST BIOPSY Left 09/06/2017   BREAST EXCISIONAL BIOPSY Left 2000   BREAST LUMPECTOMY  2000   BUNIONECTOMY Bilateral    COLONOSCOPY     KNEE ARTHROSCOPY  2003   right x1   KNEE SURGERY Left 1999/2002   left  x2   SHOULDER SURGERY  2004   SPINE SURGERY  08/2013   TONSILLECTOMY     TRIGGER FINGER RELEASE  08/2014   R THUMB   TUBAL LIGATION  06/1979    Current Outpatient Medications  Medication Instructions   amLODipine (NORVASC) 5 mg, Oral, Daily   Ascorbic Acid (VITAMIN C PO) 1 tablet, Daily   augmented betamethasone dipropionate (DIPROLENE-AF) 0.05 % cream Topical, 2 times daily   azelastine (ASTELIN) 0.1 % nasal spray 2 sprays, Each Nare, 2 times daily   benazepril (LOTENSIN) 40 mg, Oral, Daily   Calcium Carbonate-Vitamin D 600-400 MG-UNIT chew tablet 1 tablet, 2 times daily   escitalopram (LEXAPRO) 10 mg, Oral, Daily    fluticasone (FLONASE) 50 MCG/ACT nasal spray 2 sprays, Each Nare, Daily   Multiple Vitamin (MULTIVITAMIN WITH MINERALS) TABS 1 tablet, Oral, Daily   spironolactone (ALDACTONE) 25 mg, Oral, Daily   zolpidem (AMBIEN) 10 mg, Oral, At bedtime PRN, for sleep       Objective:   Physical Exam BP 132/82   Pulse 61   Temp 98.1 F (36.7 C) (Oral)   Resp 16   Ht 5\' 7"  (1.702 m)   Wt 199 lb 6 oz (90.4 kg)   LMP  (LMP Unknown)   SpO2 98%   BMI 31.23 kg/m  General:   Well developed, NAD, BMI noted. HEENT:  Normocephalic . Face symmetric, atraumatic Lungs:  CTA B Normal respiratory effort, no intercostal retractions, no accessory muscle use. Heart: RRR,  no murmur.  Lower extremities: no pretibial edema bilaterally  Skin: Not pale. Not jaundice Neurologic:  alert & oriented X3.  Speech normal, gait appropriate for age and unassisted Psych--  Cognition and judgment appear intact.  Cooperative with normal attention span and concentration.  Behavior appropriate. No anxious or depressed appearing.      Assessment  Assessment Prediabetes HTN --hypokalemia, ?primary hyperaldosteronism Anxiety, insomnia --- start to rx ambien here ~ 04-2015 (previously by rheumatology) Multinodular goiter, Dr. Lucianne Muss, seen [phone]  ---01-2009 thyroid Bx (-), BX again 11-2014.  [place] 08/2019: Unchanged  GI: --GERD  --esophageal stricture, dilatation  2009 MSK: --fibromyalgia >>> stop seen Dr Victory Dakin by the end of 2016 b/c she felt  better  --Chronic back pain -- resolved after 2015 back surgery --RLS -- Osteopenia: DEXA  11/28/13 Tscore -2.7; DEXA 09/09/16 T score -2.4; Dexa 2021 Syncope 2011 (? due to over rx of HTN) Self dc ASA 2016 d/t easy bruising   PLAN: Prediabetes: Check A1c. HTN: On amlodipine, Lotensin, Aldactone (for hypokalemia).  Ambulatory BPs are very good in the 110s.  Check a BMP. Obesity: Has an appointment to see the wellness clinic soon.  Is trying to exercise more. Goiter:gyn  referred to  ENT 08/16/2022,  they briefly talk about surgery but at the end decided to redo ultrasound next year.  Plans to see Endo. Insomnia: Trying to decrease Ambien 10 mg to half tablet at night. Vaccine advice: Consider COVID booster, flu shot this fall. RTC 6 months CPX

## 2022-09-28 DIAGNOSIS — E049 Nontoxic goiter, unspecified: Secondary | ICD-10-CM | POA: Insufficient documentation

## 2022-09-28 NOTE — Assessment & Plan Note (Signed)
Prediabetes: Check A1c. HTN: On amlodipine, Lotensin, Aldactone (for hypokalemia).  Ambulatory BPs are very good in the 110s.  Check a BMP. Obesity: Has an appointment to see the wellness clinic soon.  Is trying to exercise more. Goiter:gyn referred to  ENT 08/16/2022,  they briefly talk about surgery but at the end decided to redo ultrasound next year.  Plans to see Endo. Insomnia: Trying to decrease Ambien 10 mg to half tablet at night. Vaccine advice: Consider COVID booster, flu shot this fall. RTC 6 months CPX

## 2022-10-21 ENCOUNTER — Ambulatory Visit (INDEPENDENT_AMBULATORY_CARE_PROVIDER_SITE_OTHER): Payer: Medicare PPO | Admitting: Family Medicine

## 2022-11-01 ENCOUNTER — Other Ambulatory Visit: Payer: Self-pay | Admitting: Internal Medicine

## 2022-11-01 ENCOUNTER — Other Ambulatory Visit: Payer: Self-pay | Admitting: Family

## 2022-11-02 DIAGNOSIS — W57XXXA Bitten or stung by nonvenomous insect and other nonvenomous arthropods, initial encounter: Secondary | ICD-10-CM | POA: Diagnosis not present

## 2022-11-02 DIAGNOSIS — L918 Other hypertrophic disorders of the skin: Secondary | ICD-10-CM | POA: Diagnosis not present

## 2022-11-02 DIAGNOSIS — S90569A Insect bite (nonvenomous), unspecified ankle, initial encounter: Secondary | ICD-10-CM | POA: Diagnosis not present

## 2022-11-02 DIAGNOSIS — L821 Other seborrheic keratosis: Secondary | ICD-10-CM | POA: Diagnosis not present

## 2022-11-02 DIAGNOSIS — D229 Melanocytic nevi, unspecified: Secondary | ICD-10-CM | POA: Diagnosis not present

## 2022-11-03 ENCOUNTER — Ambulatory Visit (INDEPENDENT_AMBULATORY_CARE_PROVIDER_SITE_OTHER): Payer: Medicare PPO | Admitting: Family Medicine

## 2022-11-09 ENCOUNTER — Ambulatory Visit (INDEPENDENT_AMBULATORY_CARE_PROVIDER_SITE_OTHER): Payer: Medicare PPO | Admitting: Family Medicine

## 2022-11-09 ENCOUNTER — Encounter (INDEPENDENT_AMBULATORY_CARE_PROVIDER_SITE_OTHER): Payer: Self-pay | Admitting: Family Medicine

## 2022-11-09 VITALS — BP 118/73 | HR 69 | Temp 98.3°F | Ht 67.0 in | Wt 201.0 lb

## 2022-11-09 DIAGNOSIS — R739 Hyperglycemia, unspecified: Secondary | ICD-10-CM

## 2022-11-09 DIAGNOSIS — I10 Essential (primary) hypertension: Secondary | ICD-10-CM

## 2022-11-09 DIAGNOSIS — E041 Nontoxic single thyroid nodule: Secondary | ICD-10-CM | POA: Diagnosis not present

## 2022-11-09 DIAGNOSIS — Z6831 Body mass index (BMI) 31.0-31.9, adult: Secondary | ICD-10-CM | POA: Diagnosis not present

## 2022-11-09 DIAGNOSIS — R5383 Other fatigue: Secondary | ICD-10-CM

## 2022-11-09 DIAGNOSIS — R0602 Shortness of breath: Secondary | ICD-10-CM

## 2022-11-09 DIAGNOSIS — F32A Depression, unspecified: Secondary | ICD-10-CM

## 2022-11-09 DIAGNOSIS — Z1331 Encounter for screening for depression: Secondary | ICD-10-CM

## 2022-11-09 DIAGNOSIS — E669 Obesity, unspecified: Secondary | ICD-10-CM

## 2022-11-09 DIAGNOSIS — E668 Other obesity: Secondary | ICD-10-CM

## 2022-11-09 DIAGNOSIS — E559 Vitamin D deficiency, unspecified: Secondary | ICD-10-CM | POA: Diagnosis not present

## 2022-11-09 NOTE — Progress Notes (Signed)
Chief Complaint:   OBESITY Leah Cameron (MR# [id number]) is a 72 y.o. female who presents for evaluation and treatment of obesity and related comorbidities. Current BMI is Body mass index is 31.48 kg/m. Leah Cameron has been struggling with her weight for many years and has been unsuccessful in either losing weight, maintaining weight loss, or reaching her healthy weight goal.  Leah Cameron is currently in the action stage of change and ready to dedicate time achieving and maintaining a healthier weight. Leah Cameron is interested in becoming our patient and working on intensive lifestyle modifications including (but not limited to) diet and exercise for weight loss.  Patient was referred by Dr. Su Hilt. Lactose Intolerant- avoids most dairy. Significant  family history of high blood pressure and stroke. Living at home with husband Gaspar Garbe and patient is retired.  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  Family is supportive of her and eats meals with her and will be changing how they eat regardless of weight.  Desired weight is 165lb.  Last time she was that weight was about 15 years ago and she isn't aware what happened after that. Felt she started gaining weight with medicine change (hydrochlorothiazide to spironolactone).  She previously did Weight Watchers previously and lost weight but when she tried again 2 years ago she just couldn't really get the scale to change.  Realizes some of her bigger issues are snacks in the evening.  Eats out 3-4 time a week at [employer], [employer] and [employer].  She does skip lunch due to time.   Food Recall: Wakes up at 11a/12p.  She mentions that her first meal is often between 1-2pm.  Coffee in the am x 2.  3-4 hours later she will have lunch, sometimes at home like oatmeal (1/2  cup with blueberries or banana) or sausage (1), egg (1), cheese, 2 tbsp of grits.  She may eat keto bread if she does eat bread.  Feels satisfied from  either option.  Next time she eats will be 4/4:30 in the afternoon- half baked chicken, collard greens, potato salad from [employer] [facility] or pan broiled flouder with baked potato and slaw.  Snacks at night would be fruit (watermelon 2 cups, cantaloupe 2 cups or 1 apple) or pack of peanut butter crackers (open face x6).  Starletta's habits were reviewed today and are as follows: Her family eats meals together, she thinks her family will eat healthier with her, her desired weight loss is 36 lbs, she started gaining weight with medication change, her heaviest weight ever was 201 pounds, she has significant food cravings issues, she snacks frequently in the evenings, she wakes up frequently in the middle of the night to eat, she skips meals frequently, she is frequently drinking liquids with calories, she frequently makes poor food choices, and she struggles with emotional eating.  Depression Screen Leah Cameron's Food and Mood (modified PHQ-9) score was 9.  Subjective:   1. Other fatigue [name] admits to daytime somnolence and admits to waking up still tired. Patient has a history of symptoms of daytime fatigue. Leah Cameron generally gets 6 hours of sleep per night, and states that she has generally restful sleep. Snoring is present. Apneic episodes are not present. Epworth Sleepiness Score is 4. EKG-normal sinus rhythm at 76 bpm.  2. SOBOE (shortness of breath on exertion) Leah Cameron notes increasing shortness of breath with exercising and seems to be worsening over time with weight gain. She notes getting out of  breath sooner with activity than she used to. This has not gotten worse recently. Leah Cameron denies shortness of breath at rest or orthopnea.  3. Essential hypertension Patient was diagnosed 30 years ago. She is on benazepril, spironolactone, and amlodipine. She was previously on hydrochlorothiazide but potassium is consistently low. Her blood pressure is within normal limits today, and blood pressure is  normally well controlled.   4. Hyperglycemia, unspecified Patient's last A1c in June was 6.3. She has been pre-diabetic for >14 years.   5. Thyroid nodule Patient's last LFTs were within normal limits. She sees Dr. Lonzo Cloud at [facility].   6. Vitamin D deficiency Patient's diagnosis is likely given obesity. She notes fatigue.   Assessment/Plan:   1. Other fatigue Leah Cameron does feel that her weight is causing her energy to be lower than it should be. Fatigue may be related to obesity, depression or many other causes. Labs will be ordered, and in the meanwhile, Leah Cameron will focus on self care including making healthy food choices, increasing physical activity and focusing on stress reduction.  - EKG 12-Lead - Lipid Panel With LDL/HDL Ratio  2. SOBOE (shortness of breath on exertion) Leah Cameron does feel that she gets out of breath more easily that she used to when she exercises. Leah Cameron's shortness of breath appears to be obesity related and exercise induced. She has agreed to work on weight loss and gradually increase exercise to treat her exercise induced shortness of breath. Will continue to monitor closely.  - CBC with Differential/Platelet  3. Essential hypertension We will follow-up on patient's blood pressure at her next appointment.   4. Hyperglycemia, unspecified We will check labs today, and we will follow-up at patient's next appointment.   - Insulin, random - Comprehensive metabolic panel  5. Thyroid nodule We will check labs today, and we will follow-up at patient's next appointment.   - T3 - T4, free - TSH  6. Vitamin D deficiency We will check labs today, and we will follow-up at patient's next appointment.   - VITAMIN D 25 Hydroxy (Vit-D Deficiency, Fractures)  7. Depression screening Leah Cameron had a positive depression screening. Depression is commonly associated with obesity and often results in emotional eating behaviors. We will monitor this  closely and work on CBT to help improve the non-hunger eating patterns. Referral to Psychology may be required if no improvement is seen as she continues in our clinic.  8. Class 1 obesity with serious comorbidity and body mass index (BMI) of 31.0 to 31.9 in adult, unspecified obesity type Mixtli is currently in the action stage of change and her goal is to continue with weight loss efforts. I recommend Laurencia begin the structured treatment plan as follows:  She has agreed to the Category 2 Plan.  Exercise goals: All adults should avoid inactivity. Some physical activity is better than none, and adults who participate in any amount of physical activity gain some health benefits.   Behavioral modification strategies: increasing lean protein intake, meal planning and cooking strategies, keeping healthy foods in the home, and planning for success.  She was informed of the importance of frequent follow-up visits to maximize her success with intensive lifestyle modifications for her multiple health conditions. She was informed we would discuss her lab results at her next visit unless there is a critical issue that needs to be addressed sooner. Araiya agreed to keep her next visit at the agreed upon time to discuss these results.  Objective:   Blood pressure 118/73, pulse 69,  temperature 98.3 F (36.8 C), height 5\' 7"  (1.702 m), weight 201 lb (91.2 kg), SpO2 99%. Body mass index is 31.48 kg/m.  EKG: Normal sinus rhythm, rate 76 BPM.  Indirect Calorimeter completed today shows a VO2 of 205 and a REE of 1411.  Her calculated basal metabolic rate is 1324 thus her basal metabolic rate is worse than expected.  General: Cooperative, alert, well developed, in no acute distress. HEENT: Conjunctivae and lids unremarkable. Cardiovascular: Regular rhythm.  Lungs: Normal work of breathing. Neurologic: No focal deficits.   Lab Results  Component Value Date   CREATININE 1.00 11/09/2022   BUN 15  11/09/2022   NA 140 11/09/2022   K 4.6 11/09/2022   CL 101 11/09/2022   CO2 22 11/09/2022   Lab Results  Component Value Date   ALT 21 11/09/2022   AST 20 11/09/2022   ALKPHOS 111 11/09/2022   BILITOT 0.2 11/09/2022   Lab Results  Component Value Date   HGBA1C 6.3 09/27/2022   HGBA1C 6.4 03/23/2022   HGBA1C 6.3 12/30/2021   HGBA1C 6.1 07/03/2021   HGBA1C 6.0 01/14/2021   Lab Results  Component Value Date   INSULIN 11.7 11/09/2022   Lab Results  Component Value Date   TSH 1.440 11/09/2022   Lab Results  Component Value Date   CHOL 160 11/09/2022   HDL 49 11/09/2022   LDLCALC 92 11/09/2022   TRIG 103 11/09/2022   CHOLHDL 4 04/01/2020   Lab Results  Component Value Date   WBC 5.9 11/09/2022   HGB 12.0 11/09/2022   HCT 39.8 11/09/2022   MCV 85 11/09/2022   PLT 141 (L) 11/09/2022   Lab Results  Component Value Date   IRON 76 08/15/2015   FERRITIN 35.2 08/15/2015   Attestation Statements:   Reviewed by clinician on day of visit: allergies, medications, problem list, medical history, surgical history, family history, social history, and previous encounter notes.  Time spent on visit including pre-visit chart review and post-visit charting and care was 50 minutes.   I, Burt Knack, am acting as transcriptionist for Reuben Likes, MD.  This is the patient's first visit at [facility]. The patient's NEW PATIENT PACKET was reviewed at length. Included in the packet: current and past health history, medications, allergies, ROS, gynecologic history (women only), surgical history, family history, social history, weight history, weight loss surgery history (for those that have had weight loss surgery), nutritional evaluation, mood and food questionnaire, PHQ9, Epworth questionnaire, sleep habits questionnaire, patient life and health improvement goals questionnaire. These will all be scanned into the patient's chart under media.   During the visit, I  independently reviewed the patient's EKG, bioimpedance scale results, and indirect calorimeter results. I used this information to tailor a meal plan for the patient that will help her to lose weight and will improve her obesity-related conditions going forward. I performed a medically necessary appropriate examination and/or evaluation. I discussed the assessment and treatment plan with the patient. The patient was provided an opportunity to ask questions and all were answered. The patient agreed with the plan and demonstrated an understanding of the instructions. Labs were ordered at this visit and will be reviewed at the next visit unless more critical results need to be addressed immediately. Clinical information was updated and documented in the EMR.    I have reviewed the above documentation for accuracy and completeness, and I agree with the above. - Reuben Likes, MD

## 2022-11-16 ENCOUNTER — Other Ambulatory Visit: Payer: Self-pay | Admitting: Internal Medicine

## 2022-11-17 ENCOUNTER — Other Ambulatory Visit: Payer: Self-pay | Admitting: *Deleted

## 2022-11-17 MED ORDER — AMLODIPINE BESYLATE 5 MG PO TABS: 5.0000 mg | ORAL_TABLET | Freq: Every day | ORAL | 1 refills | Status: DC

## 2022-11-17 NOTE — Telephone Encounter (Signed)
PDMP okay, Rx sent 

## 2022-11-17 NOTE — Telephone Encounter (Signed)
Requesting: ambien Contract:10/05/22 UDS:n/a Last Visit:09/27/22 Next Visit:04/20/23 Last Refill:08/18/22  Please Advise

## 2022-11-24 ENCOUNTER — Ambulatory Visit (INDEPENDENT_AMBULATORY_CARE_PROVIDER_SITE_OTHER): Payer: Medicare PPO | Admitting: Family Medicine

## 2022-11-24 ENCOUNTER — Encounter (INDEPENDENT_AMBULATORY_CARE_PROVIDER_SITE_OTHER): Payer: Self-pay | Admitting: Family Medicine

## 2022-11-24 VITALS — BP 127/77 | HR 68 | Temp 98.2°F | Ht 67.0 in | Wt 199.0 lb

## 2022-11-24 DIAGNOSIS — F3289 Other specified depressive episodes: Secondary | ICD-10-CM | POA: Diagnosis not present

## 2022-11-24 DIAGNOSIS — R7303 Prediabetes: Secondary | ICD-10-CM | POA: Diagnosis not present

## 2022-11-24 DIAGNOSIS — E669 Obesity, unspecified: Secondary | ICD-10-CM | POA: Diagnosis not present

## 2022-11-24 DIAGNOSIS — E559 Vitamin D deficiency, unspecified: Secondary | ICD-10-CM

## 2022-11-24 DIAGNOSIS — I10 Essential (primary) hypertension: Secondary | ICD-10-CM | POA: Diagnosis not present

## 2022-11-24 DIAGNOSIS — Z6831 Body mass index (BMI) 31.0-31.9, adult: Secondary | ICD-10-CM | POA: Diagnosis not present

## 2022-11-24 MED ORDER — BUPROPION HCL ER (XL) 150 MG PO TB24: 150.0000 mg | ORAL_TABLET | Freq: Every day | ORAL | 0 refills | Status: DC

## 2022-11-24 NOTE — Progress Notes (Unsigned)
Chief Complaint:   OBESITY Rand is here to discuss her progress with her obesity treatment plan along with follow-up of her obesity related diagnoses. Kairy is on the Category 2 Plan and states she is following her eating plan approximately 85% of the time. Greenlee states she is doing 0 minutes 0 times per week.  Today's visit was #: 2 Starting weight: 201 lbs Starting date: 11/09/2022 Today's weight: 199 lbs Today's date: 11/24/2022 Total lbs lost to date: 2 Total lbs lost since last in-office visit: 2  Interim History: Patient presents for first follow up.  She had some family visiting from [place] over the last few weeks.  She recognizes her family desired many food options that were not on plan.  She still has a significant drive to snack.  She isn't eating until later because she wakes up later and goes to bed later.  Sometimes she is eating all the food on plan and still desires snacks and other times she uses the rest of the food on the plan to satisfy her desire to snack in the night. She recognizes she is a stress eater and she is experiencing quite a bit of stress currently.    Subjective:   1. Essential hypertension Patient's blood pressure is well-controlled today.  She denies chest pain, chest pressure, or headache.  She is on amlodipine, benazepril, and Aldactone.  2. Vitamin D deficiency Patient is not on vitamin D prescription, and her vitamin D level was of 42.1.  She denies nausea, vomiting, or muscle weakness but notes fatigue.  She is on OTC calcium plus D combination.  3. Prediabetes Patient's recent A1c was 6.3 and insulin 11.7.  She was diagnosed from at least 14 years ago.  She is not on medications.  4. Other depression with emotional eating Patient is on Lexapro daily.  She notes significant increase in emotional eating recently.  Her blood pressure is well-controlled.  Assessment/Plan:   1. Essential hypertension Patient will continue her current  medications with no change in doses; blood pressure at goal.  2. Vitamin D deficiency Patient is to increase OTC to twice daily.  3. Prediabetes Patient will continue her meal plan.  Pathophysiology of insulin resistance, prediabetes, and diabetes mellitus were discussed with the patient today.  4. Other depression with emotional eating Patient agreed to start Wellbutrin XL 150 mg once daily with no refills.  - buPROPion (WELLBUTRIN XL) 150 MG 24 hr tablet; Take 1 tablet (150 mg total) by mouth daily.  Dispense: 30 tablet; Refill: 0  5. BMI 31.0-31.9,adult  6. Obesity with starting BMI of 31.5 Kiki is currently in the action stage of change. As such, her goal is to continue with weight loss efforts. She has agreed to the Category 2 Plan.   Exercise goals: All adults should avoid inactivity. Some physical activity is better than none, and adults who participate in any amount of physical activity gain some health benefits.  Behavioral modification strategies: increasing lean protein intake, meal planning and cooking strategies, keeping healthy foods in the home, and planning for success.  Diavion has agreed to follow-up with our clinic in 2 to 3 weeks. She was informed of the importance of frequent follow-up visits to maximize her success with intensive lifestyle modifications for her multiple health conditions.   Objective:   Blood pressure 127/77, pulse 68, temperature 98.2 F (36.8 C), height 5\' 7"  (1.702 m), weight 199 lb (90.3 kg), SpO2 99%. Body mass index is 31.17  kg/m.  General: Cooperative, alert, well developed, in no acute distress. HEENT: Conjunctivae and lids unremarkable. Cardiovascular: Regular rhythm.  Lungs: Normal work of breathing. Neurologic: No focal deficits.   Lab Results  Component Value Date   CREATININE 1.00 11/09/2022   BUN 15 11/09/2022   NA 140 11/09/2022   K 4.6 11/09/2022   CL 101 11/09/2022   CO2 22 11/09/2022   Lab Results  Component  Value Date   ALT 21 11/09/2022   AST 20 11/09/2022   ALKPHOS 111 11/09/2022   BILITOT 0.2 11/09/2022   Lab Results  Component Value Date   HGBA1C 6.3 09/27/2022   HGBA1C 6.4 03/23/2022   HGBA1C 6.3 12/30/2021   HGBA1C 6.1 07/03/2021   HGBA1C 6.0 01/14/2021   Lab Results  Component Value Date   INSULIN 11.7 11/09/2022   Lab Results  Component Value Date   TSH 1.440 11/09/2022   Lab Results  Component Value Date   CHOL 160 11/09/2022   HDL 49 11/09/2022   LDLCALC 92 11/09/2022   TRIG 103 11/09/2022   CHOLHDL 4 04/01/2020   Lab Results  Component Value Date   VD25OH 42.1 11/09/2022   Lab Results  Component Value Date   WBC 5.9 11/09/2022   HGB 12.0 11/09/2022   HCT 39.8 11/09/2022   MCV 85 11/09/2022   PLT 141 (L) 11/09/2022   Lab Results  Component Value Date   IRON 76 08/15/2015   FERRITIN 35.2 08/15/2015   Attestation Statements:   Reviewed by clinician on day of visit: allergies, medications, problem list, medical history, surgical history, family history, social history, and previous encounter notes.  Time spent on visit including pre-visit chart review and post-visit care and charting was 45 minutes.   I, Burt Knack, am acting as transcriptionist for Reuben Likes, MD.  I have reviewed the above documentation for accuracy and completeness, and I agree with the above. - Reuben Likes, MD

## 2022-12-08 ENCOUNTER — Encounter (INDEPENDENT_AMBULATORY_CARE_PROVIDER_SITE_OTHER): Payer: Self-pay | Admitting: Family Medicine

## 2022-12-08 ENCOUNTER — Ambulatory Visit (INDEPENDENT_AMBULATORY_CARE_PROVIDER_SITE_OTHER): Payer: Medicare PPO | Admitting: Family Medicine

## 2022-12-08 VITALS — BP 130/78 | HR 75 | Temp 98.1°F | Ht 67.0 in | Wt 200.0 lb

## 2022-12-08 DIAGNOSIS — F3289 Other specified depressive episodes: Secondary | ICD-10-CM

## 2022-12-08 DIAGNOSIS — I10 Essential (primary) hypertension: Secondary | ICD-10-CM | POA: Diagnosis not present

## 2022-12-08 DIAGNOSIS — Z6831 Body mass index (BMI) 31.0-31.9, adult: Secondary | ICD-10-CM | POA: Diagnosis not present

## 2022-12-08 DIAGNOSIS — R7303 Prediabetes: Secondary | ICD-10-CM | POA: Diagnosis not present

## 2022-12-08 DIAGNOSIS — F32A Depression, unspecified: Secondary | ICD-10-CM | POA: Insufficient documentation

## 2022-12-08 DIAGNOSIS — E669 Obesity, unspecified: Secondary | ICD-10-CM | POA: Diagnosis not present

## 2022-12-08 MED ORDER — BUPROPION HCL ER (SR) 200 MG PO TB12
200.0000 mg | ORAL_TABLET | Freq: Every morning | ORAL | 0 refills | Status: AC
Start: 2022-12-08 — End: ?

## 2022-12-08 NOTE — Progress Notes (Signed)
.smr  Office: [phone]  /  Fax: [phone]  WEIGHT SUMMARY AND BIOMETRICS  Anthropometric Measurements Height: 5\' 7"  (1.702 m) Weight: 200 lb (90.7 kg) BMI (Calculated): 31.32 Weight at Last Visit: 199 lb Weight Lost Since Last Visit: 0 Weight Gained Since Last Visit: 1 lb Starting Weight: 201 lb Total Weight Loss (lbs): 1 lb (0.454 kg)   Body Composition  Body Fat %: 40.8 % Fat Mass (lbs): 81.6 lbs Muscle Mass (lbs): 112.6 lbs Total Body Water (lbs): 71.2 lbs Visceral Fat Rating : 12   Other Clinical Data Fasting: No Labs: No Today's Visit #: 3 Starting Date: 11/09/22    Chief Complaint: OBESITY  History of Present Illness   The patient, with a history of obesity, prediabetes, and hypertension, presents for a follow-up visit. Over the past two weeks, she reports a weight gain of one pound despite adherence to a category two dietary plan approximately 95% of the time. The patient has been engaging in regular exercise, walking for 30 minutes three times a week.  Two weeks ago, she was started on Wellbutrin to assist with emotional eating behaviors related to depression. The patient reports no adverse effects from the medication, such as jitteriness or worsening insomnia. However, she also reports no noticeable changes or improvements since starting the medication.  She expresses frustration with the slow progress of weight loss, despite adherence to the dietary plan and regular exercise. The patient has been consuming Factor meals, specifically the high-protein options, for convenience. However, she expresses concern about the higher sodium content in these meals due to her hypertension.  She also reports a history of insomnia, which has not worsened since starting Wellbutrin. The patient has not noticed any significant changes in physical appearance or measurements, despite the dietary and lifestyle changes. She expresses a desire for more rapid weight loss and is  considering increasing the frequency of exercise to more than three times a week.          PHYSICAL EXAM:  Blood pressure 130/78, pulse 75, temperature 98.1 F (36.7 C), height 5\' 7"  (1.702 m), weight 200 lb (90.7 kg), SpO2 97%. Body mass index is 31.32 kg/m.  DIAGNOSTIC DATA REVIEWED:  BMET    Component Value Date/Time   NA 140 11/09/2022 1034   K 4.6 11/09/2022 1034   CL 101 11/09/2022 1034   CO2 22 11/09/2022 1034   GLUCOSE 86 11/09/2022 1034   GLUCOSE 96 09/27/2022 1040   GLUCOSE 110 (H) 02/01/2006 1216   BUN 15 11/09/2022 1034   CREATININE 1.00 11/09/2022 1034   CALCIUM 10.2 11/09/2022 1034   GFRNONAA 68 (L) 11/18/2012 1745   GFRAA 78 (L) 11/18/2012 1745   Lab Results  Component Value Date   HGBA1C 6.3 09/27/2022   HGBA1C  04/17/2008    5.8 (NOTE)   The ADA recommends the following therapeutic goal for glycemic   control related to Hgb A1C measurement:   Goal of Therapy:   < 7.0% Hgb A1C   Reference: American Diabetes Association: Clinical Practice   Recommendations 2008, Diabetes Care,  2008, 31:(Suppl 1).   Lab Results  Component Value Date   INSULIN 11.7 11/09/2022   Lab Results  Component Value Date   TSH 1.440 11/09/2022   CBC    Component Value Date/Time   WBC 5.9 11/09/2022 1034   WBC 6.2 03/23/2022 1437   RBC 4.66 11/09/2022 1034   RBC 4.53 03/23/2022 1437   HGB 12.0 11/09/2022 1034   HCT 39.8 11/09/2022  1034   PLT 141 (L) 11/09/2022 1034   MCV 85 11/09/2022 1034   MCH 25.8 (L) 11/09/2022 1034   MCH 26.7 11/18/2012 1745   MCHC 30.2 (L) 11/09/2022 1034   MCHC 31.7 03/23/2022 1437   RDW 13.7 11/09/2022 1034   Iron Studies    Component Value Date/Time   IRON 76 08/15/2015 1112   FERRITIN 35.2 08/15/2015 1112   Lipid Panel     Component Value Date/Time   CHOL 160 11/09/2022 1034   TRIG 103 11/09/2022 1034   HDL 49 11/09/2022 1034   CHOLHDL 4 04/01/2020 1546   VLDL 35.0 04/01/2020 1546   LDLCALC 92 11/09/2022 1034   Hepatic  Function Panel     Component Value Date/Time   PROT 7.3 11/09/2022 1034   ALBUMIN 4.8 11/09/2022 1034   AST 20 11/09/2022 1034   ALT 21 11/09/2022 1034   ALKPHOS 111 11/09/2022 1034   BILITOT 0.2 11/09/2022 1034   BILIDIR 0.0 01/01/2009 1021      Component Value Date/Time   TSH 1.440 11/09/2022 1034   Nutritional Lab Results  Component Value Date   VD25OH 42.1 11/09/2022     Assessment and Plan    Obesity Frustration with slow weight loss despite adherence to diet plan and exercise. Discussed the body's resistance to weight loss and the importance of maintaining a consistent calorie intake and adequate protein. Noted a decrease in fat mass despite slight weight gain. -Continue current diet and exercise regimen. -Consider using meal planning programs or apps for tracking. -Continue monitoring body composition changes, not just weight.  Prediabetes Elevated insulin and A1c levels likely contributing to difficulty with weight loss. -Consider interventions to manage insulin resistance and prediabetes.  Depression with emotional eating behaviors Currently on Bupropion 150mg  with no noticeable side effects or benefits. Discussed the potential benefits of Bupropion in reducing cravings and emotional eating. -Increase Bupropion to 200mg  daily. -Continue monitoring for side effects and effectiveness.  Hypertension Concern about sodium intake due to hypertension. Discussed the impact of weight loss on blood pressure and the importance of overall diet quality. -Continue monitoring sodium intake, primarily from processed foods. -Continue current hypertension management plan.  Follow-up in 2 weeks to assess progress and adjust treatment plan as necessary.         I have personally spent 42 minutes total time today in preparation, patient care, and documentation for this visit, including the following: review of clinical lab tests; review of medical tests/procedures/services.     She was informed of the importance of frequent follow up visits to maximize her success with intensive lifestyle modifications for her multiple health conditions.    Quillian Quince, MD

## 2022-12-20 ENCOUNTER — Ambulatory Visit
Admission: RE | Admit: 2022-12-20 | Discharge: 2022-12-20 | Disposition: A | Discharge status: Home / Self Care | Payer: Medicare PPO | Source: Ambulatory Visit | Attending: Internal Medicine | Admitting: Internal Medicine

## 2022-12-20 DIAGNOSIS — N6310 Unspecified lump in the right breast, unspecified quadrant: Secondary | ICD-10-CM

## 2022-12-20 DIAGNOSIS — N6315 Unspecified lump in the right breast, overlapping quadrants: Secondary | ICD-10-CM | POA: Diagnosis not present

## 2022-12-20 DIAGNOSIS — N6311 Unspecified lump in the right breast, upper outer quadrant: Secondary | ICD-10-CM | POA: Diagnosis not present

## 2022-12-28 ENCOUNTER — Encounter: Payer: Self-pay | Admitting: Internal Medicine

## 2023-01-07 ENCOUNTER — Other Ambulatory Visit (INDEPENDENT_AMBULATORY_CARE_PROVIDER_SITE_OTHER): Payer: Self-pay | Admitting: Family Medicine

## 2023-01-07 DIAGNOSIS — F3289 Other specified depressive episodes: Secondary | ICD-10-CM

## 2023-01-08 ENCOUNTER — Other Ambulatory Visit (INDEPENDENT_AMBULATORY_CARE_PROVIDER_SITE_OTHER): Payer: Self-pay | Admitting: Family Medicine

## 2023-01-08 DIAGNOSIS — F3289 Other specified depressive episodes: Secondary | ICD-10-CM

## 2023-01-11 ENCOUNTER — Encounter (INDEPENDENT_AMBULATORY_CARE_PROVIDER_SITE_OTHER): Payer: Self-pay | Admitting: Family Medicine

## 2023-01-11 ENCOUNTER — Ambulatory Visit (INDEPENDENT_AMBULATORY_CARE_PROVIDER_SITE_OTHER): Payer: Medicare PPO | Admitting: Family Medicine

## 2023-01-11 VITALS — BP 131/83 | HR 65 | Temp 98.1°F | Ht 67.0 in | Wt 197.0 lb

## 2023-01-11 DIAGNOSIS — Z6830 Body mass index (BMI) 30.0-30.9, adult: Secondary | ICD-10-CM

## 2023-01-11 DIAGNOSIS — Z6831 Body mass index (BMI) 31.0-31.9, adult: Secondary | ICD-10-CM

## 2023-01-11 DIAGNOSIS — F3289 Other specified depressive episodes: Secondary | ICD-10-CM

## 2023-01-11 DIAGNOSIS — E669 Obesity, unspecified: Secondary | ICD-10-CM

## 2023-01-11 DIAGNOSIS — R7303 Prediabetes: Secondary | ICD-10-CM | POA: Diagnosis not present

## 2023-01-11 DIAGNOSIS — F5089 Other specified eating disorder: Secondary | ICD-10-CM | POA: Diagnosis not present

## 2023-01-11 MED ORDER — METFORMIN HCL 500 MG PO TABS
500.0000 mg | ORAL_TABLET | Freq: Every day | ORAL | 0 refills | Status: DC
Start: 2023-01-11 — End: 2023-01-25

## 2023-01-11 MED ORDER — BUPROPION HCL ER (SR) 200 MG PO TB12
200.0000 mg | ORAL_TABLET | Freq: Every morning | ORAL | 0 refills | Status: DC
Start: 2023-01-11 — End: 2023-01-25

## 2023-01-11 NOTE — Progress Notes (Signed)
Chief Complaint:   OBESITY [name] is here to discuss her progress with her obesity treatment plan along with follow-up of her obesity related diagnoses. Leah Cameron is on the Category 2 Plan and states she is following her eating plan approximately (unknown)% of the time. Leah Cameron states she is biking for 20 minutes 3 times per week.  Today's visit was #: 4 Starting weight: 201 lbs Starting date: 11/09/2022 Today's weight: 197 lbs Today's date: 01/11/2023 Total lbs lost to date: 4 Total lbs lost since last in-office visit: 3  Interim History: Patient continues to do well with her weight loss.  She has had a lot of challenges and she is frustrated at how slow this has been.  Subjective:   1. Prediabetes Patient notes increase in polyphagia, and she is open to looking at medication options to help with diabetes melitis prevention and with weight loss.  2. Emotional Eating Behavior Patient is working on decreasing emotional eating behavior.  No side effects were noted with Wellbutrin.  Assessment/Plan:   1. Prediabetes Patient agreed to start metformin 500 mg every morning with food, with no refills.  She will continue with her diet and exercise.  - metFORMIN (GLUCOPHAGE) 500 MG tablet; Take 1 tablet (500 mg total) by mouth daily with breakfast.  Dispense: 30 tablet; Refill: 0  2. Emotional Eating Behavior Patient will continue Wellbutrin SR 200 mg every morning, and we will refill for 1 month.  - buPROPion (WELLBUTRIN SR) 200 MG 12 hr tablet; Take 1 tablet (200 mg total) by mouth every morning.  Dispense: 30 tablet; Refill: 0  3. BMI 30.0-30.9,adult  4. Obesity, Beginning BMI of 31.5 Leah Cameron is currently in the action stage of change. As such, her goal is to continue with weight loss efforts. She has agreed to the Category 2 Plan.   Exercise goals: As is.   Behavioral modification strategies: increasing lean protein intake and meal planning and cooking strategies.  Leah Cameron  has agreed to follow-up with our clinic in 4 weeks. She was informed of the importance of frequent follow-up visits to maximize her success with intensive lifestyle modifications for her multiple health conditions.   Objective:   Blood pressure 131/83, pulse 65, temperature 98.1 F (36.7 C), height 5\' 7"  (1.702 m), weight 197 lb (89.4 kg), SpO2 (!) 79%. Body mass index is 30.85 kg/m.  Lab Results  Component Value Date   CREATININE 1.00 11/09/2022   BUN 15 11/09/2022   NA 140 11/09/2022   K 4.6 11/09/2022   CL 101 11/09/2022   CO2 22 11/09/2022   Lab Results  Component Value Date   ALT 21 11/09/2022   AST 20 11/09/2022   ALKPHOS 111 11/09/2022   BILITOT 0.2 11/09/2022   Lab Results  Component Value Date   HGBA1C 6.3 09/27/2022   HGBA1C 6.4 03/23/2022   HGBA1C 6.3 12/30/2021   HGBA1C 6.1 07/03/2021   HGBA1C 6.0 01/14/2021   Lab Results  Component Value Date   INSULIN 11.7 11/09/2022   Lab Results  Component Value Date   TSH 1.440 11/09/2022   Lab Results  Component Value Date   CHOL 160 11/09/2022   HDL 49 11/09/2022   LDLCALC 92 11/09/2022   TRIG 103 11/09/2022   CHOLHDL 4 04/01/2020   Lab Results  Component Value Date   VD25OH 42.1 11/09/2022   Lab Results  Component Value Date   WBC 5.9 11/09/2022   HGB 12.0 11/09/2022   HCT 39.8 11/09/2022  MCV 85 11/09/2022   PLT 141 (L) 11/09/2022   Lab Results  Component Value Date   IRON 76 08/15/2015   FERRITIN 35.2 08/15/2015   Attestation Statements:   Reviewed by clinician on day of visit: allergies, medications, problem list, medical history, surgical history, family history, social history, and previous encounter notes.   I, Burt Knack, am acting as transcriptionist for Quillian Quince, MD.  I have reviewed the above documentation for accuracy and completeness, and I agree with the above. -  Quillian Quince, MD

## 2023-01-25 ENCOUNTER — Encounter (INDEPENDENT_AMBULATORY_CARE_PROVIDER_SITE_OTHER): Payer: Self-pay | Admitting: Family Medicine

## 2023-01-25 ENCOUNTER — Ambulatory Visit (INDEPENDENT_AMBULATORY_CARE_PROVIDER_SITE_OTHER): Payer: Medicare PPO | Admitting: Family Medicine

## 2023-01-25 VITALS — BP 136/84 | HR 76 | Temp 98.4°F | Ht 67.0 in | Wt 194.0 lb

## 2023-01-25 DIAGNOSIS — F3289 Other specified depressive episodes: Secondary | ICD-10-CM

## 2023-01-25 DIAGNOSIS — Z6831 Body mass index (BMI) 31.0-31.9, adult: Secondary | ICD-10-CM

## 2023-01-25 DIAGNOSIS — Z6830 Body mass index (BMI) 30.0-30.9, adult: Secondary | ICD-10-CM

## 2023-01-25 DIAGNOSIS — F5089 Other specified eating disorder: Secondary | ICD-10-CM

## 2023-01-25 DIAGNOSIS — E669 Obesity, unspecified: Secondary | ICD-10-CM | POA: Diagnosis not present

## 2023-01-25 DIAGNOSIS — R7303 Prediabetes: Secondary | ICD-10-CM | POA: Diagnosis not present

## 2023-01-25 MED ORDER — BUPROPION HCL ER (SR) 200 MG PO TB12
200.0000 mg | ORAL_TABLET | Freq: Every morning | ORAL | 0 refills | Status: DC
Start: 2023-01-25 — End: 2023-02-16

## 2023-01-25 MED ORDER — METFORMIN HCL 500 MG PO TABS
500.0000 mg | ORAL_TABLET | Freq: Every day | ORAL | 0 refills | Status: DC
Start: 2023-01-25 — End: 2023-02-16

## 2023-01-25 NOTE — Progress Notes (Signed)
Chief Complaint:   OBESITY Leah Cameron is here to discuss her progress with her obesity treatment plan along with follow-up of her obesity related diagnoses. Leah Cameron is on the Category 2 Plan and states she is following her eating plan approximately 85% of the time. Leah Cameron states she is walking 3 times per week.    Today's visit was #: 5 Starting weight: 201 lbs Starting date: 11/09/2022 Today's weight: 194 lbs Today's date: 01/25/2023 Total lbs lost to date: 7 Total lbs lost since last in-office visit: 3  Interim History: Patient continues to do well with her weight loss.  She struggles with meal planning at dinner and she is open to looking at other dental options.  Subjective:   1. Prediabetes Patient continues to do well with her diet and weight loss to help prevent diabetes mellitus.  No side effects were mentioned on metformin. No signs of hypoglycemia.  2. Emotional Eating Behavior Patient is stable on her medications, and she is doing well with decreasing emotional eating behavior.  No side effects were mentioned on her wellbutrin.  Assessment/Plan:   1. Prediabetes Patient will continue metformin every morning, and we will refill for 1 month.  - metFORMIN (GLUCOPHAGE) 500 MG tablet; Take 1 tablet (500 mg total) by mouth daily with breakfast.  Dispense: 30 tablet; Refill: 0  2. Emotional Eating Behavior Patient will continue Wellbutrin SR every morning, and we will refill for 1 month.  - buPROPion (WELLBUTRIN SR) 200 MG 12 hr tablet; Take 1 tablet (200 mg total) by mouth every morning.  Dispense: 30 tablet; Refill: 0  3. BMI 30.0-30.9,adult  4. Obesity, Beginning BMI of 31.5 Leah Cameron is currently in the action stage of change. As such, her goal is to continue with weight loss efforts. She has agreed to the Category 2 Plan and keeping a food journal and adhering to recommended goals of 400-550 calories and 35+ grams of protein at supper daily.   Easy dinner slow  cooker meals were discussed and handout was given.  Exercise goals: As is.   Behavioral modification strategies: increasing lean protein intake and travel eating strategies.  Leah Cameron has agreed to follow-up with our clinic in 4 weeks. She was informed of the importance of frequent follow-up visits to maximize her success with intensive lifestyle modifications for her multiple health conditions.   Objective:   Blood pressure 136/84, pulse 76, temperature 98.4 F (36.9 C), height 5\' 7"  (1.702 m), weight 194 lb (88 kg), SpO2 97%. Body mass index is 30.38 kg/m.  Lab Results  Component Value Date   CREATININE 1.00 11/09/2022   BUN 15 11/09/2022   NA 140 11/09/2022   K 4.6 11/09/2022   CL 101 11/09/2022   CO2 22 11/09/2022   Lab Results  Component Value Date   ALT 21 11/09/2022   AST 20 11/09/2022   ALKPHOS 111 11/09/2022   BILITOT 0.2 11/09/2022   Lab Results  Component Value Date   HGBA1C 6.3 09/27/2022   HGBA1C 6.4 03/23/2022   HGBA1C 6.3 12/30/2021   HGBA1C 6.1 07/03/2021   HGBA1C 6.0 01/14/2021   Lab Results  Component Value Date   INSULIN 11.7 11/09/2022   Lab Results  Component Value Date   TSH 1.440 11/09/2022   Lab Results  Component Value Date   CHOL 160 11/09/2022   HDL 49 11/09/2022   LDLCALC 92 11/09/2022   TRIG 103 11/09/2022   CHOLHDL 4 04/01/2020   Lab Results  Component Value Date  VD25OH 42.1 11/09/2022   Lab Results  Component Value Date   WBC 5.9 11/09/2022   HGB 12.0 11/09/2022   HCT 39.8 11/09/2022   MCV 85 11/09/2022   PLT 141 (L) 11/09/2022   Lab Results  Component Value Date   IRON 76 08/15/2015   FERRITIN 35.2 08/15/2015   Attestation Statements:   Reviewed by clinician on day of visit: allergies, medications, problem list, medical history, surgical history, family history, social history, and previous encounter notes.   I, Burt Knack, am acting as transcriptionist for Quillian Quince, MD.  I have reviewed the above  documentation for accuracy and completeness, and I agree with the above. -  Quillian Quince, MD

## 2023-01-26 ENCOUNTER — Telehealth: Payer: Self-pay | Admitting: Family

## 2023-01-26 ENCOUNTER — Other Ambulatory Visit: Payer: Self-pay | Admitting: Internal Medicine

## 2023-02-04 NOTE — Telephone Encounter (Signed)
Refills sent

## 2023-02-16 ENCOUNTER — Encounter (INDEPENDENT_AMBULATORY_CARE_PROVIDER_SITE_OTHER): Payer: Self-pay | Admitting: Family Medicine

## 2023-02-16 ENCOUNTER — Ambulatory Visit (INDEPENDENT_AMBULATORY_CARE_PROVIDER_SITE_OTHER): Payer: Medicare PPO | Admitting: Family Medicine

## 2023-02-16 VITALS — BP 128/75 | HR 79 | Temp 98.4°F | Ht 67.0 in | Wt 195.0 lb

## 2023-02-16 DIAGNOSIS — R7303 Prediabetes: Secondary | ICD-10-CM | POA: Diagnosis not present

## 2023-02-16 DIAGNOSIS — F3289 Other specified depressive episodes: Secondary | ICD-10-CM

## 2023-02-16 DIAGNOSIS — Z6830 Body mass index (BMI) 30.0-30.9, adult: Secondary | ICD-10-CM | POA: Diagnosis not present

## 2023-02-16 DIAGNOSIS — F5089 Other specified eating disorder: Secondary | ICD-10-CM

## 2023-02-16 DIAGNOSIS — E669 Obesity, unspecified: Secondary | ICD-10-CM | POA: Diagnosis not present

## 2023-02-16 DIAGNOSIS — Z6831 Body mass index (BMI) 31.0-31.9, adult: Secondary | ICD-10-CM

## 2023-02-16 MED ORDER — METFORMIN HCL 500 MG PO TABS: 500.0000 mg | ORAL_TABLET | Freq: Two times a day (BID) | ORAL | 0 refills | Status: DC

## 2023-02-16 MED ORDER — BUPROPION HCL ER (SR) 200 MG PO TB12
200.0000 mg | ORAL_TABLET | Freq: Every morning | ORAL | 0 refills | Status: DC
Start: 2023-02-16 — End: 2023-04-20

## 2023-02-16 NOTE — Progress Notes (Signed)
.smr  Office: [phone]  /  Fax: [phone]  WEIGHT SUMMARY AND BIOMETRICS  Anthropometric Measurements Height: 5\' 7"  (1.702 m) Weight: 195 lb (88.5 kg) BMI (Calculated): 30.53 Weight at Last Visit: 194 lb Weight Lost Since Last Visit: 0 Weight Gained Since Last Visit: 1 lb Starting Weight: 201 lb Total Weight Loss (lbs): 6 lb (2.722 kg)   Body Composition  Body Fat %: 40.2 % Fat Mass (lbs): 78.4 lbs Muscle Mass (lbs): 110.8 lbs Total Body Water (lbs): 68.6 lbs Visceral Fat Rating : 12   Other Clinical Data Fasting: No Labs: No Today's Visit #: 6 Starting Date: 11/09/22    Chief Complaint: OBESITY   History of Present Illness   The patient, with a history of prediabetes, depression, and obesity, presents with concerns about her weight management and emotional eating behaviors. She reports a weight gain of one pound over the past three weeks, despite adhering to a category two eating plan 95% of the time and engaging in regular exercise, walking for 30 minutes three times per week.  The patient is currently on metformin for prediabetes and Wellbutrin for emotional eating behaviors. She expresses dissatisfaction with the slow pace of her weight loss and expresses interest in exploring other medication options to expedite the process.  The patient also reports an increase in late-night hunger since returning from a recent trip, which she managed mindfully in terms of diet and exercise. She expresses frustration with the perceived ineffectiveness of her current medications, particularly in controlling her emotional eating behaviors.  The patient has a history of successful weight loss through [employer], reaching a weight of 157 pounds. Her current goal is to reach a weight of 160 pounds. She expresses a desire to exceed her recommended weight range due to her height and a desire to avoid cardiovascular problems, as she has a family history of massive strokes.  The  patient is open to increasing her metformin dosage and is considering other medications, such as Rybelsus or Wegovy, despite potential insurance coverage issues. She is also considering increasing her protein intake and strengthening activities to boost her metabolism and facilitate weight loss.  The patient is currently managing her diet mostly in her head, drawing on her past experience with Weight [employer]. She expresses a willingness to start journaling her food intake to better track her progress.          PHYSICAL EXAM:  Blood pressure 128/75, pulse 79, temperature 98.4 F (36.9 C), height 5\' 7"  (1.702 m), weight 195 lb (88.5 kg), SpO2 99%. Body mass index is 30.54 kg/m.  DIAGNOSTIC DATA REVIEWED:  BMET    Component Value Date/Time   NA 140 11/09/2022 1034   K 4.6 11/09/2022 1034   CL 101 11/09/2022 1034   CO2 22 11/09/2022 1034   GLUCOSE 86 11/09/2022 1034   GLUCOSE 96 09/27/2022 1040   GLUCOSE 110 (H) 02/01/2006 1216   BUN 15 11/09/2022 1034   CREATININE 1.00 11/09/2022 1034   CALCIUM 10.2 11/09/2022 1034   GFRNONAA 68 (L) 11/18/2012 1745   GFRAA 78 (L) 11/18/2012 1745   Lab Results  Component Value Date   HGBA1C 6.3 09/27/2022   HGBA1C  04/17/2008    5.8 (NOTE)   The ADA recommends the following therapeutic goal for glycemic   control related to Hgb A1C measurement:   Goal of Therapy:   < 7.0% Hgb A1C   Reference: American Diabetes Association: Clinical Practice   Recommendations 2008, Diabetes Care,  2008, 31:(Suppl  1).   Lab Results  Component Value Date   INSULIN 11.7 11/09/2022   Lab Results  Component Value Date   TSH 1.440 11/09/2022   CBC    Component Value Date/Time   WBC 5.9 11/09/2022 1034   WBC 6.2 03/23/2022 1437   RBC 4.66 11/09/2022 1034   RBC 4.53 03/23/2022 1437   HGB 12.0 11/09/2022 1034   HCT 39.8 11/09/2022 1034   PLT 141 (L) 11/09/2022 1034   MCV 85 11/09/2022 1034   MCH 25.8 (L) 11/09/2022 1034   MCH 26.7 11/18/2012 1745    MCHC 30.2 (L) 11/09/2022 1034   MCHC 31.7 03/23/2022 1437   RDW 13.7 11/09/2022 1034   Iron Studies    Component Value Date/Time   IRON 76 08/15/2015 1112   FERRITIN 35.2 08/15/2015 1112   Lipid Panel     Component Value Date/Time   CHOL 160 11/09/2022 1034   TRIG 103 11/09/2022 1034   HDL 49 11/09/2022 1034   CHOLHDL 4 04/01/2020 1546   VLDL 35.0 04/01/2020 1546   LDLCALC 92 11/09/2022 1034   Hepatic Function Panel     Component Value Date/Time   PROT 7.3 11/09/2022 1034   ALBUMIN 4.8 11/09/2022 1034   AST 20 11/09/2022 1034   ALT 21 11/09/2022 1034   ALKPHOS 111 11/09/2022 1034   BILITOT 0.2 11/09/2022 1034   BILIDIR 0.0 01/01/2009 1021      Component Value Date/Time   TSH 1.440 11/09/2022 1034   Nutritional Lab Results  Component Value Date   VD25OH 42.1 11/09/2022     Assessment and Plan    Prediabetes Stable on Metformin 500mg . Patient is adhering to category two eating plan and walking for exercise three times per week. -Increase Metformin to 500mg  twice daily with breakfast and lunch. -Check response at next visit.    Emotional Eating Behavior Stable on Wellbutrin 200mg . Patient reports increased hunger and late-night eating since returning from travel. -Continue Wellbutrin 200mg . -Consider increasing dose at next visit if symptoms persist.  Obesity Patient gained one pound in the last three weeks, likely due to fluid shift. Patient is mindful of portion control and protein intake but is interested in accelerating weight loss. -Encourage patient to continue current diet and exercise regimen. -Consider adding strengthening exercises to routine. -Encourage patient to journal food intake for better tracking. 1100-1200 calories with 85 or more grams of protein -Set short-term weight loss goals. -Next appointment in one month.        She was informed of the importance of frequent follow up visits to maximize her success with intensive lifestyle  modifications for her multiple health conditions.    Quillian Quince, MD

## 2023-02-18 ENCOUNTER — Ambulatory Visit: Payer: Medicare PPO | Admitting: Internal Medicine

## 2023-02-18 ENCOUNTER — Encounter: Payer: Self-pay | Admitting: Internal Medicine

## 2023-02-18 VITALS — BP 122/80 | HR 75 | Ht 67.0 in | Wt 195.0 lb

## 2023-02-18 DIAGNOSIS — E042 Nontoxic multinodular goiter: Secondary | ICD-10-CM

## 2023-02-18 NOTE — Patient Instructions (Signed)
  Marliss Coots, M.D. Medical School: [employer] [facility] [employer] [facility] [phone]) Residency: [employer] [place] [phone]) Fellowship: [employer] [place] (Vascular and Interventional Radiology)  Clinical and Procedural Expertise:  TIPS and Portal Hypertension Thyroid Nodule Radiofrequency Ablation Cholangioscopy and Complex Biliary Interventions Peripheral Artery Disease Interventions Interventional Oncology Prostate Artery Embolization Uterine Artery Embolization Chronic Venous Occlusion Reconstruction Deep Vein Thrombosis and Pulmonary Embolism Pelvic Venous Disease Vertebral Cement Augmentation Intravascular Ultrasound Contrast Enhanced Ultrasound Non-Invasive Cardiovascular Imaging

## 2023-02-18 NOTE — Progress Notes (Unsigned)
Name: Leah Cameron  MRN/ DOB: [id number], 1950-05-10    Age/ Sex: 72 y.o., female    PCP: Leah Plump, MD   Reason for Endocrinology Evaluation: MNG     Date of Initial Endocrinology Evaluation: 08/07/2021    HPI: [name] is a 72 y.o. female with a past medical history of HTN, multinodular goiter, and fibromyalgia. The patient presented for initial endocrinology clinic visit on 08/07/2021  for consultative assistance with her MNG.   Patient has been diagnosed with multinodular goiter since 2010.  She is status post FNA of the left thyroid nodule in October 2010 with benign cytology, at the time the nodule was 2.2 cm.  This nodule has had another benign FNA in 2016 with nodule size 2.0 cm at the time.    Thyroid ultrasound dated April 08, 2021 , the left nodule has enlarged to 5.9 cm, which was biopsied 09/03/2021 with benign cytology  No prior exposure to XRT   Mother with thyroid disease   SUBJECTIVE:    Today (02/18/23):  [name] is here for a follow up on MNG.    Since her last visit here she was evaluated by [facility] 08/2022 She did notice a decrease in the size of the neck Denies dysphagia , denies pain or hoar sess  Denies palpitations  Has occasional tremors  Denies loose stools/diarrhea but has recent constipation      HISTORY:  Past Medical History:  Past Medical History:  Diagnosis Date   Allergic rhinitis    Allergy Penicillin   Arthritis    foot hands,legs   Back pain    Back pain, chronic    Cancer (HCC)    breast,left   Fibromyalgia    sees Dr.Davenshwar   GERD (gastroesophageal reflux disease)    HPV (human papilloma virus) infection    Hyperglycemia    Hypertension    Insomnia    Joint pain    Lactose intolerance    Menopause    on lexapro was started by gyn   Osteopenia    Prediabetes    RLS (restless legs syndrome)    Scoliosis    Stricture esophagus    last EGD and dilatation 3/09    Syncope 07/2009   admitted thought to be due to over treatment of hypertension   Thyromegaly    Vertigo    went to ED on 10/30   Past Surgical History:  Past Surgical History:  Procedure Laterality Date   BACK SURGERY  09/05/2013   cage and rods L4 L5 (@ [facility])   BIOPSY THYROID  01/2009   negative    BREAST BIOPSY Left 09/06/2017   BREAST EXCISIONAL BIOPSY Left 2000   BUNIONECTOMY Bilateral    COLONOSCOPY     KNEE ARTHROSCOPY  2003   right x1   KNEE SURGERY Left 1999/2002   left  x2   SHOULDER SURGERY  2004   SPINE SURGERY  08/2013   TONSILLECTOMY     TRIGGER FINGER RELEASE  08/2014   R THUMB   TUBAL LIGATION  06/1979    Social History:  reports that she has never smoked. She has never been exposed to tobacco smoke. She has never used smokeless tobacco. She reports current alcohol use. She reports that she does not use drugs. Family History: family history includes Anxiety disorder in her mother; Arthritis in her mother; Breast cancer in an other family member; Coronary artery disease in an other family  member; Dementia in her sister; Depression in her mother; Diabetes in an other family member; Early death in her brother, brother, and brother; Heart disease in her paternal grandfather; Hypertension in her father, mother, and sister; Lung cancer in an other family member; Miscarriages / [place] in her daughter; Stroke in her father, mother, and sister; Thyroid disease in her mother.   HOME MEDICATIONS: Allergies as of 02/18/2023       Reactions   Cyclobenzaprine Hcl Other (See Comments)   Face swelling   Chlorzoxazone Rash, Swelling   Hydrocodone-acetaminophen Rash   Penicillins Swelling, Rash   Tolerated cefazolin   Tramadol Hcl Itching, Rash        Medication List        Accurate as of February 18, 2023 11:21 AM. If you have any questions, ask your nurse or doctor.          amLODipine 5 MG tablet Commonly known as: NORVASC Take 1 tablet (5 mg total) by  mouth daily.   augmented betamethasone dipropionate 0.05 % cream Commonly known as: DIPROLENE-AF Apply topically 2 (two) times daily.   azelastine 0.1 % nasal spray Commonly known as: ASTELIN Place 2 sprays into both nostrils 2 (two) times daily. What changed:  when to take this reasons to take this   benazepril 40 MG tablet Commonly known as: LOTENSIN Take 1 tablet (40 mg total) by mouth daily.   buPROPion 200 MG 12 hr tablet Commonly known as: Wellbutrin SR Take 1 tablet (200 mg total) by mouth every morning.   Calcium Carbonate-Vitamin D 600-400 MG-UNIT chew tablet Chew 1 tablet by mouth 2 (two) times daily.   escitalopram 10 MG tablet Commonly known as: LEXAPRO Take 1 tablet (10 mg total) by mouth daily.   fluticasone 50 MCG/ACT nasal spray Commonly known as: FLONASE Place 2 sprays into both nostrils daily. What changed:  when to take this reasons to take this   metFORMIN 500 MG tablet Commonly known as: GLUCOPHAGE Take 1 tablet (500 mg total) by mouth 2 (two) times daily with a meal.   multivitamin with minerals Tabs tablet Take 1 tablet by mouth daily.   spironolactone 25 MG tablet Commonly known as: ALDACTONE Take 1 tablet (25 mg total) by mouth daily.   VITAMIN C PO Take 1 tablet by mouth daily.   zolpidem 10 MG tablet Commonly known as: AMBIEN TAKE 1 TABLET AT BEDTIME AS NEEDED FOR SLEEP          REVIEW OF SYSTEMS: A comprehensive ROS was conducted with the patient and is negative except as per HPI     OBJECTIVE:  VS: BP 122/80 (BP Location: Left Arm, Patient Position: Sitting, Cuff Size: Large)   Pulse 75   Ht 5\' 7"  (1.702 m)   Wt 195 lb (88.5 kg)   LMP  (LMP Unknown)   SpO2 94%   BMI 30.54 kg/m    Wt Readings from Last 3 Encounters:  02/18/23 195 lb (88.5 kg)  02/16/23 195 lb (88.5 kg)  01/25/23 194 lb (88 kg)     EXAM: General: Pt appears well and is in NAD  Neck: General: Supple without adenopathy. Thyroid: Left thyroid  nodule appreciated.   Lungs: Clear with good BS bilat with no rales, rhonchi, or wheezes  Heart: Auscultation: RRR.  Abdomen: Normoactive bowel sounds, soft, nontender, without masses or organomegaly palpable  Extremities:  BL LE: No pretibial edema normal ROM and strength.  Mental Status: Judgment, insight: Intact Mood and affect: No  depression, anxiety, or agitation     DATA REVIEWED:   Latest Reference Range & Units 11/09/22 10:34  TSH 0.450 - 4.500 uIU/mL 1.440    Thyroid Ultrasound 06/07/2022   Nodule 1: 2.3 x 1.3 x 1.2 cm TI-RADS 3 nodule in the right mid thyroid lobe is unchanged since 04/08/2021. It meets criteria for imaging follow-up.   _________________________________________________________   Nodule 4: 7.2 x 3.7 x 4.6 cm solid isoechoic left mid thyroid nodule is not significantly changed in size allowing for differences in measurement technique. Please correlate with prior FNA results from 09/03/2021. This nodule is difficult to measure due to its ill-defined margins and differences in size between examinations likely due to technique rather than true growth.   _________________________________________________________   Nodule 5: 2.0 x 2.2 x 2.0 cm isthmus nodule is not significantly changed in size since 11/30/2017 which indicates a benign etiology.   _________________________________________________________   Nodule 6: This nodule was remeasured similar to prior examination from 02/23/2022 and does not demonstrate significant interval change in size. It measures 1.7 x 1.4 by 1.8 cm on the current examination. It is difficult to measure given its ill-defined margins and most likely represents a pseudo nodule.   _________________________________________________________   Nodule 7: Ill-defined hypoechoic region in the isthmus measuring 1.9 x 1.1 x 1.4 cm is not significantly changed since prior examination. This is also favored to be a pseudo nodule given  lack of defined margins.   _________________________________________________________   Nodule 8: 1.6 x 1.4 x 1.4 cm solid isoechoic (TI-RADS 3) isthmus nodule does not demonstrate threshold growth since 04/08/2021. It does meet criteria for imaging follow-up given that it measures greater than 1.5 cm on today's exam.   _________________________________________________________   Nodules 2 and 3 do not meet criteria for FNA or imaging follow-up.   IMPRESSION: 1. Previously biopsied left thyroid nodule does not demonstrate significant changed in size since prior examination. Please correlate with prior FNA results from 09/03/2021. 2. Nodules 1 and 8, both of which are TI-RADS 3, meet criteria for imaging follow-up. They are not significantly changed in size since 04/08/2021. Annual ultrasound surveillance is recommended until 5 years of stability is documented. 3. The overall size of the left thyroid lobe is mildly increased measuring 8.4 x 4.9 x 3.5 cm on today's examination compared to 7.0 x 4.3 x 3.7 cm on prior examination from 02/2022.    FNA 09/03/2021  Clinical History: Nodule 4: 5.9 x 3.1 x 4.0 cm nodule occupying the majority of the left thyroid lobe does not appear significantly changes since prior examination. Prior FNA of this nodule was performed in 2010 and 08/01/2014. Please correlate with prior results. Specimen Submitted:  A. THYROID, LT LOBE NODULE #4, FINE NEEDLE ASPIRATION:   FINAL MICROSCOPIC DIAGNOSIS: - Consistent with benign follicular nodule (Bethesda category II)      ASSESSMENT/PLAN/RECOMMENDATIONS:   Multinodular Goiter:  -Patient with no local neck symptoms -She is clinically euthyroid -She has a large 5.9 cm left thyroid nodule, this nodule was biopsied in 2010 with benign cytology at 2.2 cm in diameter at the time.  This was again biopsied with benign cytology at 2.0 cm in diameter in 2016.  This has increased now to 5.9 cm which was  biopsied 08/2021 with benign cytology  - We have opted to continue to monitor but has low threshold for surgical intervention should she started to have local symptoms - TFT's are normal  -We also discussed radiofrequency ablation as an alternative if  needed    Follow-up in 1 yr    I spent 32 minutes preparing to see the patient by review of recent labs, imaging and procedures, obtaining and reviewing separately obtained history, communicating with the patient/family or caregiver, ordering medications, tests or procedures, and documenting clinical information in the EHR including the differential Dx, treatment, and any further evaluation and other management    Signed electronically by: Lyndle Herrlich, MD  [facility] Endocrinology  [facility] [address] [place] [place], [place] [address] Phone: [phone] FAX: [phone]   CC: Leah Plump, MD [address] [place] [address] Phone: [phone] Fax: [phone]   Return to Endocrinology clinic as below: Future Appointments  Date Time Provider Department Center  03/23/2023  2:00 PM Wilder Glade, MD [facility] None  04/20/2023 10:00 AM Leah Plump, MD [facility]  04/21/2023  2:00 PM Wilder Glade, MD [facility] None

## 2023-02-25 ENCOUNTER — Ambulatory Visit (HOSPITAL_BASED_OUTPATIENT_CLINIC_OR_DEPARTMENT_OTHER): Admission: RE | Admit: 2023-02-25 | Payer: Medicare PPO | Source: Ambulatory Visit

## 2023-03-14 ENCOUNTER — Other Ambulatory Visit (INDEPENDENT_AMBULATORY_CARE_PROVIDER_SITE_OTHER): Payer: Self-pay | Admitting: Family Medicine

## 2023-03-14 DIAGNOSIS — R7303 Prediabetes: Secondary | ICD-10-CM

## 2023-03-15 ENCOUNTER — Other Ambulatory Visit (INDEPENDENT_AMBULATORY_CARE_PROVIDER_SITE_OTHER): Payer: Self-pay | Admitting: Family Medicine

## 2023-03-15 DIAGNOSIS — R7303 Prediabetes: Secondary | ICD-10-CM

## 2023-03-15 NOTE — Telephone Encounter (Signed)
New message    1. Which medications need to be refilled? (please list name of each medication and dose if known) metFORMIN (GLUCOPHAGE) 500 MG tablet   2. Which pharmacy/location (including street and city if local pharmacy) is medication to be sent to?[id number] - [place], [place] - [address]    3. Do they need a 30 day or 90 day supply?  30 day supply

## 2023-03-17 MED ORDER — METFORMIN HCL 500 MG PO TABS: 500.0000 mg | ORAL_TABLET | Freq: Two times a day (BID) | ORAL | 0 refills | Status: DC

## 2023-03-17 NOTE — Telephone Encounter (Signed)
Patient will run out before her next appointment.     LAST APPOINTMENT DATE: 02/16/2023 NEXT APPOINTMENT DATE: 03/23/2023   [facility] - [place], [place] - [address] [place] [address] Phone: [phone] Fax: [phone]  [id number] - [place],  - [address] AT [facility] [address] [place] [address] Phone: [phone] Fax: [phone]  Patient is requesting a refill of the following medications: Requested Prescriptions   Pending Prescriptions Disp Refills   metFORMIN (GLUCOPHAGE) 500 MG tablet 60 tablet 0    Sig: Take 1 tablet (500 mg total) by mouth 2 (two) times daily with a meal.    Date last filled: 02/16/2023 Previously prescribed by [facility]  Lab Results  Component Value Date   HGBA1C 6.3 09/27/2022   HGBA1C 6.4 03/23/2022   HGBA1C 6.3 12/30/2021   Lab Results  Component Value Date   LDLCALC 92 11/09/2022   CREATININE 1.00 11/09/2022   Lab Results  Component Value Date   VD25OH 42.1 11/09/2022    BP Readings from Last 3 Encounters:  02/18/23 122/80  02/16/23 128/75  01/25/23 136/84

## 2023-03-23 ENCOUNTER — Ambulatory Visit (INDEPENDENT_AMBULATORY_CARE_PROVIDER_SITE_OTHER): Payer: Medicare PPO | Admitting: Family Medicine

## 2023-04-20 ENCOUNTER — Ambulatory Visit: Payer: Medicare PPO | Admitting: Internal Medicine

## 2023-04-20 ENCOUNTER — Encounter: Payer: Self-pay | Admitting: Internal Medicine

## 2023-04-20 VITALS — BP 126/80 | HR 74 | Temp 98.2°F | Resp 16 | Ht 67.0 in | Wt 195.2 lb

## 2023-04-20 DIAGNOSIS — I10 Essential (primary) hypertension: Secondary | ICD-10-CM

## 2023-04-20 DIAGNOSIS — Z0000 Encounter for general adult medical examination without abnormal findings: Secondary | ICD-10-CM

## 2023-04-20 DIAGNOSIS — M8580 Other specified disorders of bone density and structure, unspecified site: Secondary | ICD-10-CM | POA: Diagnosis not present

## 2023-04-20 DIAGNOSIS — Z0001 Encounter for general adult medical examination with abnormal findings: Secondary | ICD-10-CM

## 2023-04-20 DIAGNOSIS — R739 Hyperglycemia, unspecified: Secondary | ICD-10-CM

## 2023-04-20 LAB — BASIC METABOLIC PANEL
BUN: 19 mg/dL (ref 6–23)
CO2: 29 meq/L (ref 19–32)
Calcium: 9.8 mg/dL (ref 8.4–10.5)
Chloride: 103 meq/L (ref 96–112)
Creatinine, Ser: 0.98 mg/dL (ref 0.40–1.20)
GFR: 58.02 mL/min — ABNORMAL LOW (ref >60.00)
Glucose, Bld: 103 mg/dL — ABNORMAL HIGH (ref 70–99)
Potassium: 4.3 meq/L (ref 3.5–5.1)
Sodium: 139 meq/L (ref 135–145)

## 2023-04-20 LAB — HEMOGLOBIN A1C: Hgb A1c MFr Bld: 6.5 % (ref 4.6–6.5)

## 2023-04-20 NOTE — Progress Notes (Signed)
 Subjective:    Patient ID: Leah Cameron, female    DOB: 20-Mar-1951, 73 y.o.   MRN: [id number]  DOS:  04/20/2023 Type of visit - description: cpx  Here for CPX. Feeling well. No major concerns.  Wt Readings from Last 3 Encounters:  04/20/23 195 lb 4 oz (88.6 kg)  02/18/23 195 lb (88.5 kg)  02/16/23 195 lb (88.5 kg)    Review of Systems  A 14 point review of systems is negative    Past Medical History:  Diagnosis Date   Allergic rhinitis    Allergy Penicillin   Arthritis    foot hands,legs   Back pain    Back pain, chronic    Cancer (HCC)    breast,left   Fibromyalgia    sees Dr.Davenshwar   GERD (gastroesophageal reflux disease)    HPV (human papilloma virus) infection    Hyperglycemia    Hypertension    Insomnia    Joint pain    Lactose intolerance    Menopause    on lexapro  was started by gyn   Osteopenia    Prediabetes    RLS (restless legs syndrome)    Scoliosis    Stricture esophagus    last EGD and dilatation 3/09   Syncope 07/2009   admitted thought to be due to over treatment of hypertension   Thyromegaly    Vertigo    went to ED on 10/30    Past Surgical History:  Procedure Laterality Date   BACK SURGERY  09/05/2013   cage and rods L4 L5 (@ [facility])   BIOPSY THYROID   01/2009   negative    BREAST BIOPSY Left 09/06/2017   BREAST EXCISIONAL BIOPSY Left 2000   BUNIONECTOMY Bilateral    COLONOSCOPY     KNEE ARTHROSCOPY  2003   right x1   KNEE SURGERY Left 1999/2002   left  x2   SHOULDER SURGERY  2004   SPINE SURGERY  08/2013   TONSILLECTOMY     TRIGGER FINGER RELEASE  08/2014   R THUMB   TUBAL LIGATION  06/1979   Social History   Socioeconomic History   Marital status: Married    Spouse name: Not on file   Number of children: 2   Years of education: Not on file   Highest education level: Master's degree (e.g., MA, MS, MEng, MEd, MSW, MBA)  Occupational History   Occupation:  Retired 2001, Psychologist, Forensic, disable     Tobacco Use   Smoking status: Never    Passive exposure: Never   Smokeless tobacco: Never  Vaping Use   Vaping status: Never Used  Substance and Sexual Activity   Alcohol use: Yes    Comment: socially    Drug use: No   Sexual activity: Not on file  Other Topics Concern   Not on file  Social History Narrative   Retired    3 g-kids   Keeps a g-child on-off         Social Drivers of Corporate Investment Banker Strain: Low Risk  (09/26/2022)   Overall Financial Resource Strain (CARDIA)    Difficulty of Paying Living Expenses: Not hard at all  Food Insecurity: No Food Insecurity (09/26/2022)   Hunger Vital Sign    Worried About Running Out of Food in the Last Year: Never true    Ran Out of Food in the Last Year: Never true  Transportation Needs: No Transportation Needs (09/26/2022)   PRAPARE - Transportation  Lack of Transportation (Medical): No    Lack of Transportation (Non-Medical): No  Physical Activity: Insufficiently Active (09/26/2022)   Exercise Vital Sign    Days of Exercise per Week: 1 day    Minutes of Exercise per Session: 50 min  Stress: No Stress Concern Present (09/26/2022)   [employer] of Occupational Health - Occupational Stress Questionnaire    Feeling of Stress : Not at all  Recent Concern: Stress - Stress Concern Present (08/10/2022)   [employer] of Occupational Health - Occupational Stress Questionnaire    Feeling of Stress : To some extent  Social Connections: Socially Integrated (09/26/2022)   Social Connection and Isolation Panel [NHANES]    Frequency of Communication with Friends and Family: More than three times a week    Frequency of Social Gatherings with Friends and Family: Once a week    Attends Religious Services: More than 4 times per year    Active Member of [employer] or Organizations: Yes    Attends Engineer, Structural: More than 4 times per year    Marital Status: Married  Catering Manager Violence: Not At Risk  (08/11/2022)   Humiliation, Afraid, Rape, and Kick questionnaire    Fear of Current or Ex-Partner: No    Emotionally Abused: No    Physically Abused: No    Sexually Abused: No    Current Outpatient Medications  Medication Instructions   amLODipine  (NORVASC ) 5 mg, Oral, Daily   azelastine  (ASTELIN ) 0.1 % nasal spray 2 sprays, Each Nare, 2 times daily   benazepril  (LOTENSIN ) 40 mg, Oral, Daily   Calcium Carbonate-Vitamin D  600-400 MG-UNIT chew tablet 1 tablet, 2 times daily   escitalopram  (LEXAPRO ) 10 mg, Oral, Daily   fluticasone  (FLONASE ) 50 MCG/ACT nasal spray 2 sprays, Each Nare, Daily   Multiple Vitamin (MULTIVITAMIN WITH MINERALS) TABS 1 tablet, Daily   spironolactone  (ALDACTONE ) 25 mg, Oral, Daily   zolpidem  (AMBIEN ) 10 mg, Oral, At bedtime PRN, for sleep       Objective:   Physical Exam BP 126/80   Pulse 74   Temp 98.2 F (36.8 C) (Oral)   Resp 16   Ht 5' 7 (1.702 m)   Wt 195 lb 4 oz (88.6 kg)   LMP  (LMP Unknown)   SpO2 97%   BMI 30.58 kg/m  General: Well developed, NAD, BMI noted Neck: Nontender thyromegaly, more noticeable on the left.   HEENT:  Normocephalic . Face symmetric, atraumatic Lungs:  CTA B Normal respiratory effort, no intercostal retractions, no accessory muscle use. Heart: RRR,  no murmur.  Abdomen:  Not distended, soft, non-tender. No rebound or rigidity.   Lower extremities: no pretibial edema bilaterally  Skin: Exposed areas without rash. Not pale. Not jaundice Neurologic:  alert & oriented X3.  Speech normal, gait appropriate for age and unassisted Strength symmetric and appropriate for age.  Psych: Cognition and judgment appear intact.  Cooperative with normal attention span and concentration.  Behavior appropriate. No anxious or depressed appearing.     Assessment    Problem list Prediabetes HTN --hypokalemia, ?primary hyperaldosteronism Anxiety, insomnia --- start to rx ambien  here ~ 04-2015 (previously by  rheumatology) Multinodular goiter, per ENDO  ---01-2009 thyroid  Bx (-), BX again 11-2014.   GI: --GERD  --esophageal stricture, dilatation  2009 [facility]: --fibromyalgia >>> stop seen Dr Margarita by the end of 2016 b/c she felt  better  --Chronic back pain -- resolved after 2015 back surgery --RLS -- Osteopenia:   11/28/13 Tscore -2.7;  09/09/16 T score -2.4; Dexa ([facility])  09/04/2019: T-score -2.4 Syncope 2011 (? due to over rx of HTN) Self dc ASA 2016 d/t easy bruising   PLAN: Here for CPX -Td 2019 - pnm 13: 2018; pnm 23: 06/2018 - s/p Zostavax and Shingrix; s/p RSV - Had a flu shot and covid vax 12/2022 -- Female care: Sees GYN regularly, [facility] 12-2022 ([facility]) -- CCS: Colonoscopy:  01/10/2002 and 01-2012, neg, cscope 04/2022, next per GI -- Labs : BMP A1c - Patient education: Diet and exercise. -POA information provided Hyperglycemia: Check A1c HTN: Continue amlodipine , Lotensin , Aldactone .  Check BMP.  Reports ambulatory BPs are in the 120s. Anxiety insomnia: On Lexapro , Ambien , controlled. BMI 30: Was seen at [facility] last [facility].  Plans to go back at some point but right now she is busy, has a sister in hospice. Osteopenia: Last T-score March 26, 2022: T-score -1.4.  Risk of major fracture 4.3%, risk of hip fracture 0.6%.  Of note is, this was not done at the same place [facility]), recommend calcium and vitamin D  RTC 6 months

## 2023-04-20 NOTE — Patient Instructions (Signed)
  Check the  blood pressure regularly Blood pressure goal:  between 110/65 and  135/85. If it is consistently higher or lower, let me know     GO TO THE LAB : Get the blood work     Next visit with me in 6 months Please schedule it at the front desk        "Health Care Power of attorney" ,  "Living will" (Advance care planning documents)  If you already have a living will or healthcare power of attorney, is recommended you bring the copy to be scanned in your chart.   The document will be available to all the doctors you see in the system.  Advance care planning is a process that supports adults in  understanding and sharing their preferences regarding future medical care.  The patient's preferences are recorded in documents called Advance Directives and the can be modified at any time while the patient is in full mental capacity.   If you don't have one, please consider create one.      More information at: [email or web address]

## 2023-04-20 NOTE — Assessment & Plan Note (Signed)
 Problem list Prediabetes HTN --hypokalemia, ?primary hyperaldosteronism Anxiety, insomnia --- start to rx ambien  here ~ 04-2015 (previously by rheumatology) Multinodular goiter, per ENDO  ---01-2009 thyroid  Bx (-), BX again 11-2014.   GI: --GERD  --esophageal stricture, dilatation  2009 [facility]: --fibromyalgia >>> stop seen Dr Margarita by the end of 2016 b/c she felt  better  --Chronic back pain -- resolved after 2015 back surgery --RLS -- Osteopenia:   11/28/13 Tscore -2.7;   09/09/16 T score -2.4; Dexa ([facility])  09/04/2019: T-score -2.4 Syncope 2011 (? due to over rx of HTN) Self dc ASA 2016 d/t easy bruising   PLAN: Here for CPX -Td 2019 - pnm 13: 2018; pnm 23: 06/2018 - s/p Zostavax and Shingrix; s/p RSV - Had a flu shot and covid vax 12/2022 -- Female care: Sees GYN regularly, [facility] 12-2022 ([facility]) -- CCS: Colonoscopy:  01/10/2002 and 01-2012, neg, cscope 04/2022, next per GI -- Labs : BMP A1c - Patient education: Diet and exercise. -POA information provided

## 2023-04-20 NOTE — Assessment & Plan Note (Signed)
  Here for CPX  Hyperglycemia: Check A1c HTN: Continue amlodipine , Lotensin , Aldactone .  Check BMP.  Reports ambulatory BPs are in the 120s. Anxiety insomnia: On Lexapro , Ambien , controlled. BMI 30: Was seen at the wellness center last year.  Plans to go back at some point but right now she is busy, has a sister in hospice. Osteopenia: Last T-score March 26, 2022: T-score -1.4.  Risk of major fracture 4.3%, risk of hip fracture 0.6%.  Of note is, this was not done at the same place [facility]), recommend calcium and vitamin D  RTC 6 months

## 2023-04-21 ENCOUNTER — Ambulatory Visit (INDEPENDENT_AMBULATORY_CARE_PROVIDER_SITE_OTHER): Payer: Medicare PPO | Admitting: Family Medicine

## 2023-04-27 ENCOUNTER — Telehealth: Payer: Self-pay | Admitting: Internal Medicine

## 2023-04-27 NOTE — Telephone Encounter (Signed)
 PDMP reviewed, got a 90-day supply January 27, 2023, okay to refill.

## 2023-04-27 NOTE — Telephone Encounter (Signed)
 Requesting: Ambien  10mg   Contract: 10/05/22 UDS: Ambien  only  Last Visit: 04/20/23 Next Visit: 10/19/23 Last Refill: 11/17/22 #90 and 1RF   Too early?   Please Advise

## 2023-05-23 ENCOUNTER — Telehealth: Payer: Medicare PPO | Admitting: Physician Assistant

## 2023-05-23 DIAGNOSIS — J019 Acute sinusitis, unspecified: Secondary | ICD-10-CM

## 2023-05-23 MED ORDER — DOXYCYCLINE HYCLATE 100 MG PO TABS: 100.0000 mg | ORAL_TABLET | Freq: Two times a day (BID) | ORAL | 0 refills | Status: AC

## 2023-05-23 NOTE — Progress Notes (Signed)

## 2023-05-24 ENCOUNTER — Encounter (INDEPENDENT_AMBULATORY_CARE_PROVIDER_SITE_OTHER): Payer: Self-pay

## 2023-05-24 ENCOUNTER — Ambulatory Visit (INDEPENDENT_AMBULATORY_CARE_PROVIDER_SITE_OTHER): Payer: Medicare PPO | Admitting: Family Medicine

## 2023-06-16 ENCOUNTER — Encounter (INDEPENDENT_AMBULATORY_CARE_PROVIDER_SITE_OTHER): Payer: Self-pay

## 2023-06-20 ENCOUNTER — Ambulatory Visit (HOSPITAL_BASED_OUTPATIENT_CLINIC_OR_DEPARTMENT_OTHER)
Admission: RE | Admit: 2023-06-20 | Discharge: 2023-06-20 | Disposition: A | Discharge status: Home / Self Care | Payer: Medicare PPO | Source: Ambulatory Visit | Attending: Internal Medicine | Admitting: Internal Medicine

## 2023-06-20 DIAGNOSIS — E042 Nontoxic multinodular goiter: Secondary | ICD-10-CM | POA: Diagnosis not present

## 2023-06-23 ENCOUNTER — Telehealth: Payer: Self-pay

## 2023-06-23 NOTE — Telephone Encounter (Signed)
 Left message with [facility] about report not being available.

## 2023-06-23 NOTE — Telephone Encounter (Signed)
 Patient calling for [place] results

## 2023-06-27 ENCOUNTER — Encounter: Payer: Self-pay | Admitting: Internal Medicine

## 2023-06-28 NOTE — Telephone Encounter (Signed)
 [place] routed as requested.

## 2023-08-04 DIAGNOSIS — J690 Pneumonitis due to inhalation of food and vomit: Secondary | ICD-10-CM | POA: Diagnosis not present

## 2023-08-05 ENCOUNTER — Ambulatory Visit: Admitting: Internal Medicine

## 2023-08-05 ENCOUNTER — Encounter: Payer: Self-pay | Admitting: Internal Medicine

## 2023-08-05 ENCOUNTER — Ambulatory Visit (HOSPITAL_BASED_OUTPATIENT_CLINIC_OR_DEPARTMENT_OTHER)
Admission: RE | Admit: 2023-08-05 | Discharge: 2023-08-05 | Disposition: A | Discharge status: Home / Self Care | Source: Ambulatory Visit | Attending: Family | Admitting: Family

## 2023-08-05 ENCOUNTER — Ambulatory Visit (INDEPENDENT_AMBULATORY_CARE_PROVIDER_SITE_OTHER): Admitting: Family

## 2023-08-05 VITALS — BP 142/88 | HR 91 | Ht 67.0 in | Wt 204.2 lb

## 2023-08-05 DIAGNOSIS — R051 Acute cough: Secondary | ICD-10-CM | POA: Diagnosis not present

## 2023-08-05 DIAGNOSIS — R059 Cough, unspecified: Secondary | ICD-10-CM | POA: Diagnosis not present

## 2023-08-05 NOTE — Telephone Encounter (Signed)
 Called and spoke w/ Pt- I apogolized for the confusion during today's visit. Informed her of chest x-ray results, she informed me that Laura's CMA has made her aware. She appreciated and thanked me for my call but states she is still unsure if she will be seen again.

## 2023-08-05 NOTE — Progress Notes (Signed)
 Leah Cameron is a 73 y.o. female with the following history as recorded in [employer]:  Patient Active Problem List   Diagnosis Date Noted   Prediabetes 12/08/2022   Depression 12/08/2022   BMI 31.0-31.9,adult 12/08/2022   Class 1 obesity with serious comorbidity and body mass index (BMI) of 31.0 to 31.9 in adult 12/08/2022   Goiter 09/28/2022   Insomnia 01/17/2019   Osteopenia 12/22/2016   PCP NOTES >>>>>>>>>>>>>>>>>>>>>>>>>>>>>>>>>>>> 03/24/2015   Acquired spondylolisthesis 08/14/2013   Annual physical exam 10/26/2011   Palpitations 08/12/2010   MENOPAUSAL SYNDROME 01/01/2009   Anxiety state 01/01/2009   Multinodular goiter 12/21/2006   RESTLESS LEG SYNDROME 12/21/2006   Essential hypertension--hypokalemia, question of primary hyperaldosteronism 12/21/2006   Allergic rhinitis 12/21/2006   STRICTURE, ESOPHAGEAL 12/21/2006   GERD 12/21/2006   Backache 12/21/2006   Fibromyalgia 12/21/2006    Current Outpatient Medications  Medication Sig Dispense Refill   amLODipine  (NORVASC ) 5 MG tablet Take 1 tablet (5 mg total) by mouth daily. 90 tablet 1   azelastine  (ASTELIN ) 0.1 % nasal spray Place 2 sprays into both nostrils 2 (two) times daily. (Patient taking differently: Place 2 sprays into both nostrils as needed.) 30 mL 6   benazepril  (LOTENSIN ) 40 MG tablet Take 1 tablet (40 mg total) by mouth daily. 90 tablet 1   Calcium Carbonate-Vitamin D  600-400 MG-UNIT chew tablet Chew 1 tablet by mouth 2 (two) times daily.      escitalopram  (LEXAPRO ) 10 MG tablet Take 1 tablet (10 mg total) by mouth daily. 90 tablet 2   fluticasone  (FLONASE ) 50 MCG/ACT nasal spray Place 2 sprays into both nostrils daily. (Patient taking differently: Place 2 sprays into both nostrils as needed.) 16 g 6   Multiple Vitamin (MULTIVITAMIN WITH MINERALS) TABS Take 1 tablet by mouth daily.     spironolactone  (ALDACTONE ) 25 MG tablet Take 1 tablet (25 mg total) by mouth daily. 90 tablet 1   zolpidem  (AMBIEN )  10 MG tablet TAKE 1 TABLET AT BEDTIME AS NEEDED FOR SLEEP 90 tablet 1   No current facility-administered medications for this visit.    Allergies: Cyclobenzaprine hcl, Chlorzoxazone, Hydrocodone-acetaminophen, Penicillins, and Tramadol hcl  Past Medical History:  Diagnosis Date   Allergic rhinitis    Allergy Penicillin   Arthritis    foot hands,legs   Back pain    Back pain, chronic    Cancer (HCC)    breast,left   Fibromyalgia    sees Dr.Davenshwar   GERD (gastroesophageal reflux disease)    HPV (human papilloma virus) infection    Hyperglycemia    Hypertension    Insomnia    Joint pain    Lactose intolerance    Menopause    on lexapro  was started by gyn   Osteopenia    Prediabetes    RLS (restless legs syndrome)    Scoliosis    Stricture esophagus    last EGD and dilatation 3/09   Syncope 07/2009   admitted thought to be due to over treatment of hypertension   Thyromegaly    Vertigo    went to ED on 10/30    Past Surgical History:  Procedure Laterality Date   BACK SURGERY  09/05/2013   cage and rods L4 L5 (@ [facility])   BIOPSY THYROID   01/2009   negative    BREAST BIOPSY Left 09/06/2017   BREAST EXCISIONAL BIOPSY Left 2000   BUNIONECTOMY Bilateral    COLONOSCOPY     KNEE ARTHROSCOPY  2003   right x1  KNEE SURGERY Left 1999/2002   left  x2   SHOULDER SURGERY  2004   SPINE SURGERY  08/2013   TONSILLECTOMY     TRIGGER FINGER RELEASE  08/2014   R THUMB   TUBAL LIGATION  06/1979    Family History  Problem Relation Age of Onset   Stroke Mother        M, F, 1/2 sister, sister    Thyroid  disease Mother    Arthritis Mother    Hypertension Mother    Depression Mother    Anxiety disorder Mother    Hypertension Father    Stroke Father    Stroke Sister    Dementia Sister    Hypertension Sister    Early death Brother    Early death Brother    Early death Brother    Heart disease Paternal Grandfather    Miscarriages / [place] Daughter    Breast cancer  Other        1/2 sister   Diabetes Other        aunt   Coronary artery disease Other        GF late in life   Lung cancer Other        cousin   Colon cancer Neg Hx    Colon polyps Neg Hx    Crohn's disease Neg Hx    Esophageal cancer Neg Hx    Rectal cancer Neg Hx    Stomach cancer Neg Hx    Ulcerative colitis Neg Hx     Social History   Tobacco Use   Smoking status: Never    Passive exposure: Never   Smokeless tobacco: Never  Substance Use Topics   Alcohol use: Yes    Comment: socially     Subjective:   Patient was on vacation in [place] this past week; she notes that she was eating out a few days ago and felt like food "went down the wrong pipe"- she actually went to [facility] for evaluation and was told that she may need to get CT to get thorough evaluation; she was prescribed Doxycyline, Metronidazole, Prednisone ; she notes she did start the prednisone ; concerned about scratchy sensation in her throat; no shortness of breath but has continue to cough; has been able to eat/ drink with no difficulty;   Objective:  Vitals:   08/05/23 1157  BP: (!) 142/88  Pulse: 91  SpO2: 98%  Weight: 204 lb 3.2 oz (92.6 kg)  Height: 5\' 7"  (1.702 m)    General: Well developed, well nourished, in no acute distress  Skin : Warm and dry.  Head: Normocephalic and atraumatic  Eyes: Sclera and conjunctiva clear; pupils round and reactive to light; extraocular movements intact  Ears: External normal; canals clear; tympanic membranes normal  Oropharynx: Pink, supple. No suspicious lesions  Neck: Supple without thyromegaly, adenopathy  Lungs: Respirations unlabored; clear to auscultation bilaterally without wheeze, rales, rhonchi  CVS exam: normal rate and regular rhythm.  Neurologic: Alert and oriented; speech intact; face symmetrical; moves all extremities well; CNII-XII intact without focal deficit   Assessment:  1. Acute cough     Plan:  Subjective concern for aspiration pneumonia;  physical exam is reassuring; will update STAT CXR; she is already on prednisone  and have encouraged her to take Doxycycline  as prescribed; follow up to be determined based on CXR; ER precautions discussed with patient;   No follow-ups on file.  Orders Placed This Encounter  Procedures   DG Chest 2 View  Standing Status:   Future    Number of Occurrences:   1    Expiration Date:   08/04/2024    Reason for Exam (SYMPTOM  OR DIAGNOSIS REQUIRED):   cough/ ? aspiration pneumonia    Preferred imaging location?:   [facility]    Requested Prescriptions    No prescriptions requested or ordered in this encounter

## 2023-08-08 ENCOUNTER — Emergency Department (HOSPITAL_BASED_OUTPATIENT_CLINIC_OR_DEPARTMENT_OTHER)

## 2023-08-08 ENCOUNTER — Encounter (HOSPITAL_BASED_OUTPATIENT_CLINIC_OR_DEPARTMENT_OTHER): Payer: Self-pay | Admitting: Emergency Medicine

## 2023-08-08 ENCOUNTER — Telehealth: Payer: Self-pay

## 2023-08-08 ENCOUNTER — Other Ambulatory Visit: Payer: Self-pay

## 2023-08-08 ENCOUNTER — Other Ambulatory Visit: Payer: Self-pay | Admitting: Internal Medicine

## 2023-08-08 ENCOUNTER — Emergency Department (HOSPITAL_BASED_OUTPATIENT_CLINIC_OR_DEPARTMENT_OTHER)
Admission: EM | Admit: 2023-08-08 | Discharge: 2023-08-08 | Disposition: A | Discharge status: Home / Self Care | Attending: Emergency Medicine | Admitting: Emergency Medicine

## 2023-08-08 DIAGNOSIS — E1165 Type 2 diabetes mellitus with hyperglycemia: Secondary | ICD-10-CM | POA: Diagnosis not present

## 2023-08-08 DIAGNOSIS — R062 Wheezing: Secondary | ICD-10-CM | POA: Diagnosis not present

## 2023-08-08 DIAGNOSIS — R059 Cough, unspecified: Secondary | ICD-10-CM | POA: Diagnosis not present

## 2023-08-08 DIAGNOSIS — R0602 Shortness of breath: Secondary | ICD-10-CM | POA: Diagnosis not present

## 2023-08-08 DIAGNOSIS — J9811 Atelectasis: Secondary | ICD-10-CM | POA: Diagnosis not present

## 2023-08-08 DIAGNOSIS — R42 Dizziness and giddiness: Secondary | ICD-10-CM | POA: Diagnosis not present

## 2023-08-08 DIAGNOSIS — Z853 Personal history of malignant neoplasm of breast: Secondary | ICD-10-CM | POA: Diagnosis not present

## 2023-08-08 DIAGNOSIS — Z79899 Other long term (current) drug therapy: Secondary | ICD-10-CM | POA: Diagnosis not present

## 2023-08-08 DIAGNOSIS — E049 Nontoxic goiter, unspecified: Secondary | ICD-10-CM | POA: Diagnosis not present

## 2023-08-08 DIAGNOSIS — I10 Essential (primary) hypertension: Secondary | ICD-10-CM | POA: Diagnosis not present

## 2023-08-08 DIAGNOSIS — J40 Bronchitis, not specified as acute or chronic: Secondary | ICD-10-CM | POA: Diagnosis not present

## 2023-08-08 LAB — CBC WITH DIFFERENTIAL/PLATELET
Abs Immature Granulocytes: 0.03 10*3/uL (ref 0.00–0.07)
Basophils Absolute: 0 10*3/uL (ref 0.0–0.1)
Basophils Relative: 0 %
Eosinophils Absolute: 0 10*3/uL (ref 0.0–0.5)
Eosinophils Relative: 0 %
HCT: 39.8 % (ref 36.0–46.0)
Hemoglobin: 12.8 g/dL (ref 12.0–15.0)
Immature Granulocytes: 0 %
Lymphocytes Relative: 11 %
Lymphs Abs: 0.8 10*3/uL (ref 0.7–4.0)
MCH: 26.4 pg (ref 26.0–34.0)
MCHC: 32.2 g/dL (ref 30.0–36.0)
MCV: 82.1 fL (ref 80.0–100.0)
Monocytes Absolute: 0.1 10*3/uL (ref 0.1–1.0)
Monocytes Relative: 2 %
Neutro Abs: 6.8 10*3/uL (ref 1.7–7.7)
Neutrophils Relative %: 87 %
Platelets: 136 10*3/uL — ABNORMAL LOW (ref 150–400)
RBC: 4.85 MIL/uL (ref 3.87–5.11)
RDW: 14.6 % (ref 11.5–15.5)
WBC: 7.8 10*3/uL (ref 4.0–10.5)
nRBC: 0 % (ref 0.0–0.2)

## 2023-08-08 LAB — COMPREHENSIVE METABOLIC PANEL WITH GFR
ALT: 32 U/L (ref 0–44)
AST: 29 U/L (ref 15–41)
Albumin: 4.4 g/dL (ref 3.5–5.0)
Alkaline Phosphatase: 86 U/L (ref 38–126)
Anion gap: 14 (ref 5–15)
BUN: 18 mg/dL (ref 8–23)
CO2: 22 mmol/L (ref 22–32)
Calcium: 10.1 mg/dL (ref 8.9–10.3)
Chloride: 98 mmol/L (ref 98–111)
Creatinine, Ser: 1.07 mg/dL — ABNORMAL HIGH (ref 0.44–1.00)
GFR, Estimated: 55 mL/min — ABNORMAL LOW (ref >60)
Glucose, Bld: 235 mg/dL — ABNORMAL HIGH (ref 70–99)
Potassium: 4.5 mmol/L (ref 3.5–5.1)
Sodium: 135 mmol/L (ref 135–145)
Total Bilirubin: <0.2 mg/dL (ref 0.0–1.2)
Total Protein: 7.2 g/dL (ref 6.5–8.1)

## 2023-08-08 LAB — RESP PANEL BY RT-PCR (RSV, FLU A&B, COVID)  RVPGX2
Influenza A by PCR: NEGATIVE
Influenza B by PCR: NEGATIVE
Resp Syncytial Virus by PCR: NEGATIVE
SARS Coronavirus 2 by RT PCR: NEGATIVE

## 2023-08-08 MED ORDER — LACTATED RINGERS IV BOLUS
1000.0000 mL | Freq: Once | INTRAVENOUS | Status: AC
  Administered 2023-08-08: 1000 mL via INTRAVENOUS

## 2023-08-08 MED ORDER — IOHEXOL 350 MG/ML SOLN
75.0000 mL | Freq: Once | INTRAVENOUS | Status: AC | PRN
  Administered 2023-08-08: 75 mL via INTRAVENOUS

## 2023-08-08 MED ORDER — BENZONATATE 100 MG PO CAPS: 200.0000 mg | ORAL_CAPSULE | Freq: Three times a day (TID) | ORAL | 0 refills | Status: DC | PRN

## 2023-08-08 MED ORDER — ALBUTEROL SULFATE HFA 108 (90 BASE) MCG/ACT IN AERS: 1.0000 | INHALATION_SPRAY | Freq: Four times a day (QID) | RESPIRATORY_TRACT | 0 refills | Status: AC | PRN

## 2023-08-08 MED ORDER — IPRATROPIUM-ALBUTEROL 0.5-2.5 (3) MG/3ML IN SOLN
3.0000 mL | Freq: Once | RESPIRATORY_TRACT | Status: AC
  Administered 2023-08-08: 3 mL via RESPIRATORY_TRACT
  Filled 2023-08-08: qty 3

## 2023-08-08 NOTE — ED Provider Notes (Signed)
 [facility] Provider Note  CSN: [id number] Arrival date & time: 08/08/23 1503  Chief Complaint(s) Cough  HPI Leah Cameron is a 73 y.o. female history of hypertension, hyperlipidemia, diabetes presenting to the emergency department with dry cough.  Patient reports cough for around 1 week.  Reports mild shortness of breath.  No fevers or chills.  No chest pain or abdominal pain.  No back pain.  Reports it began after she was eating a piece of [place] that she felt "went down the wrong pipe" she developed coughing afterwards.  Went to urgent care, was prescribed prednisone  and doxycycline .  Had persistent symptoms that was her primary doctor on Friday, had a chest x-ray which was negative however symptoms have been persistent.  Denies any history of COPD or asthma.   Past Medical History Past Medical History:  Diagnosis Date   Allergic rhinitis    Allergy Penicillin   Arthritis    foot hands,legs   Back pain    Back pain, chronic    Cancer (HCC)    breast,left   Fibromyalgia    sees Dr.Davenshwar   GERD (gastroesophageal reflux disease)    HPV (human papilloma virus) infection    Hyperglycemia    Hypertension    Insomnia    Joint pain    Lactose intolerance    Menopause    on lexapro  was started by gyn   Osteopenia    Prediabetes    RLS (restless legs syndrome)    Scoliosis    Stricture esophagus    last EGD and dilatation 3/09   Syncope 07/2009   admitted thought to be due to over treatment of hypertension   Thyromegaly    Vertigo    went to ED on 10/30   Patient Active Problem List   Diagnosis Date Noted   Prediabetes 12/08/2022   Depression 12/08/2022   BMI 31.0-31.9,adult 12/08/2022   Class 1 obesity with serious comorbidity and body mass index (BMI) of 31.0 to 31.9 in adult 12/08/2022   Goiter 09/28/2022   Insomnia 01/17/2019   Osteopenia 12/22/2016   PCP NOTES >>>>>>>>>>>>>>>>>>>>>>>>>>>>>>>>>>>>  03/24/2015   Acquired spondylolisthesis 08/14/2013   Annual physical exam 10/26/2011   Palpitations 08/12/2010   MENOPAUSAL SYNDROME 01/01/2009   Anxiety state 01/01/2009   Multinodular goiter 12/21/2006   RESTLESS LEG SYNDROME 12/21/2006   Essential hypertension--hypokalemia, question of primary hyperaldosteronism 12/21/2006   Allergic rhinitis 12/21/2006   STRICTURE, ESOPHAGEAL 12/21/2006   GERD 12/21/2006   Backache 12/21/2006   Fibromyalgia 12/21/2006   Home Medication(s) Prior to Admission medications   Medication Sig Start Date End Date Taking? Authorizing Provider  albuterol (VENTOLIN HFA) 108 (90 Base) MCG/ACT inhaler Inhale 1-2 puffs into the lungs every 6 (six) hours as needed for wheezing or shortness of breath. 08/08/23  Yes Mordecai Applebaum, MD  benzonatate  (TESSALON ) 100 MG capsule Take 2 capsules (200 mg total) by mouth 3 (three) times daily as needed for cough. 08/08/23  Yes Mordecai Applebaum, MD  amLODipine  (NORVASC ) 5 MG tablet Take 1 tablet (5 mg total) by mouth daily. 04/27/23   Paz, Jose E, MD  azelastine  (ASTELIN ) 0.1 % nasal spray Place 2 sprays into both nostrils 2 (two) times daily. Patient taking differently: Place 2 sprays into both nostrils as needed. 09/10/20   Ezell Hollow, MD  benazepril  (LOTENSIN ) 40 MG tablet Take 1 tablet (40 mg total) by mouth daily. 01/26/23   Paz, Jose E, MD  Calcium  Carbonate-Vitamin D  600-400 MG-UNIT chew tablet Chew 1 tablet by mouth 2 (two) times daily.     [provider]  escitalopram  (LEXAPRO ) 10 MG tablet Take 1 tablet (10 mg total) by mouth daily. 11/01/22   Ezell Hollow, MD  fluticasone  (FLONASE ) 50 MCG/ACT nasal spray Place 2 sprays into both nostrils daily. Patient taking differently: Place 2 sprays into both nostrils as needed. 03/30/18   Raymona Caldwell, MD  Multiple Vitamin (MULTIVITAMIN WITH MINERALS) TABS Take 1 tablet by mouth daily.    [provider]  spironolactone  (ALDACTONE ) 25 MG tablet Take 1  tablet (25 mg total) by mouth daily. 02/04/23   Ezell Hollow, MD  zolpidem  (AMBIEN ) 10 MG tablet TAKE 1 TABLET AT BEDTIME AS NEEDED FOR SLEEP 04/27/23   Ezell Hollow, MD                                                                                                                                    Past Surgical History Past Surgical History:  Procedure Laterality Date   BACK SURGERY  09/05/2013   cage and rods L4 L5 (@ [facility])   BIOPSY THYROID   01/2009   negative    BREAST BIOPSY Left 09/06/2017   BREAST EXCISIONAL BIOPSY Left 2000   BUNIONECTOMY Bilateral    COLONOSCOPY     KNEE ARTHROSCOPY  2003   right x1   KNEE SURGERY Left 1999/2002   left  x2   SHOULDER SURGERY  2004   SPINE SURGERY  08/2013   TONSILLECTOMY     TRIGGER FINGER RELEASE  08/2014   R THUMB   TUBAL LIGATION  06/1979   Family History Family History  Problem Relation Age of Onset   Stroke Mother        M, F, 1/2 sister, sister    Thyroid  disease Mother    Arthritis Mother    Hypertension Mother    Depression Mother    Anxiety disorder Mother    Hypertension Father    Stroke Father    Stroke Sister    Dementia Sister    Hypertension Sister    Early death Brother    Early death Brother    Early death Brother    Heart disease Paternal Grandfather    Miscarriages / [place] Daughter    Breast cancer Other        1/2 sister   Diabetes Other        aunt   Coronary artery disease Other        GF late in life   Lung cancer Other        cousin   Colon cancer Neg Hx    Colon polyps Neg Hx    Crohn's disease Neg Hx    Esophageal cancer Neg Hx    Rectal cancer Neg Hx    Stomach cancer Neg Hx    Ulcerative colitis Neg Hx  Social History Social History   Tobacco Use   Smoking status: Never    Passive exposure: Never   Smokeless tobacco: Never  Vaping Use   Vaping status: Never Used  Substance Use Topics   Alcohol use: Yes    Comment: socially    Drug use: No   Allergies Cyclobenzaprine  hcl, Chlorzoxazone, Hydrocodone-acetaminophen, Penicillins, and Tramadol hcl  Review of Systems Review of Systems  All other systems reviewed and are negative.   Physical Exam Vital Signs  I have reviewed the triage vital signs BP (!) 151/82   Pulse 81   Temp 98.6 F (37 C)   Resp 16   LMP  (LMP Unknown)   SpO2 96%  Physical Exam Vitals and nursing note reviewed.  Constitutional:      General: She is not in acute distress.    Appearance: She is well-developed.  HENT:     Head: Normocephalic and atraumatic.     Mouth/Throat:     Mouth: Mucous membranes are moist.  Eyes:     Pupils: Pupils are equal, round, and reactive to light.  Cardiovascular:     Rate and Rhythm: Normal rate and regular rhythm.     Heart sounds: No murmur heard. Pulmonary:     Effort: Pulmonary effort is normal.     Comments: Frequent cough, coarse breath sounds throughout with some trace wheeze Abdominal:     General: Abdomen is flat.     Palpations: Abdomen is soft.     Tenderness: There is no abdominal tenderness.  Musculoskeletal:        General: No tenderness.     Right lower leg: No edema.     Left lower leg: No edema.  Skin:    General: Skin is warm and dry.  Neurological:     General: No focal deficit present.     Mental Status: She is alert. Mental status is at baseline.  Psychiatric:        Mood and Affect: Mood normal.        Behavior: Behavior normal.     ED Results and Treatments Labs (all labs ordered are listed, but only abnormal results are displayed) Labs Reviewed  COMPREHENSIVE METABOLIC PANEL WITH GFR - Abnormal; Notable for the following components:      Result Value   Glucose, Bld 235 (*)    Creatinine, Ser 1.07 (*)    GFR, Estimated 55 (*)    All other components within normal limits  CBC WITH DIFFERENTIAL/PLATELET - Abnormal; Notable for the following components:   Platelets 136 (*)    All other components within normal limits  RESP PANEL BY RT-PCR (RSV, FLU  A&B, COVID)  RVPGX2                                                                                                                          Radiology CT Angio Chest PE W and/or Wo Contrast Result Date: 08/08/2023 CLINICAL DATA:  Pulmonary  embolism suspected with high probability. Cough for 1 week. Day 5 of antibiotics and prednisone  treatment. Shortness of breath and wheezing. EXAM: CT ANGIOGRAPHY CHEST WITH CONTRAST TECHNIQUE: Multidetector CT imaging of the chest was performed using the standard protocol during bolus administration of intravenous contrast. Multiplanar CT image reconstructions and MIPs were obtained to evaluate the vascular anatomy. RADIATION DOSE REDUCTION: This exam was performed according to the departmental dose-optimization program which includes automated exposure control, adjustment of the mA and/or kV according to patient size and/or use of iterative reconstruction technique. CONTRAST:  75mL OMNIPAQUE IOHEXOL 350 MG/ML SOLN COMPARISON:  Chest radiograph 08/08/2023 FINDINGS: Cardiovascular: Technically adequate study with good opacification of the central and segmental pulmonary arteries and mild motion artifact. No focal filling defects are demonstrated. No evidence of significant pulmonary embolus. Normal heart size. No pericardial effusions. Normal caliber thoracic aorta with scattered calcification. No aortic dissection. Great vessel origins are patent. Mediastinum/Nodes: Esophagus is decompressed. Small esophageal hiatal hernia. No significant lymphadenopathy. 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 Left thyroid  lobe measures 4.7 cm in diameter. This is previously been evaluated at ultrasound on 06/20/2023. See prior report for follow-up recommendations. Lungs/Pleura: Motion artifact limits examination. Mild atelectasis in the lung bases. No focal consolidation. No pleural effusion or pneumothorax. Upper Abdomen: No acute abnormalities. Musculoskeletal:  Degenerative changes in the spine. No acute bony abnormalities. Review of the MIP images confirms the above findings. IMPRESSION: 1. No evidence of significant pulmonary embolus. 2. No evidence of active pulmonary disease. Mild atelectasis in the lung bases. 3. Large thyroid  goiter. This has been evaluated on previous imaging. (ref: J Am Coll Radiol. 2015 Feb;12(2): 143-50). Electronically Signed   By: Boyce Byes M.D.   On: 08/08/2023 17:39   DG Chest 2 View Result Date: 08/08/2023 CLINICAL DATA:  Cough for 1 week. Day 5 of treatment with prednisone  and antibiotics. Shortness of breath and wheezing. EXAM: CHEST - 2 VIEW COMPARISON:  08/05/2023 FINDINGS: Patient rotation limits examination. Heart size and pulmonary vascularity appear normal. Lungs are clear. No pleural effusion or pneumothorax. Thoracic scoliosis convex towards the right. IMPRESSION: No active cardiopulmonary disease. Electronically Signed   By: Boyce Byes M.D.   On: 08/08/2023 16:24    Pertinent labs & imaging results that were available during my care of the patient were reviewed by me and considered in my medical decision making (see MDM for details).  Medications Ordered in ED Medications  ipratropium-albuterol (DUONEB) 0.5-2.5 (3) MG/3ML nebulizer solution 3 mL (3 mLs Nebulization Given 08/08/23 1602)  lactated ringers bolus 1,000 mL (1,000 mLs Intravenous New Bag/Given 08/08/23 1701)  iohexol (OMNIPAQUE) 350 MG/ML injection 75 mL (75 mLs Intravenous Contrast Given 08/08/23 1701)                                                                                                                                     Procedures Procedures  (including critical care time)  Medical Decision Making / ED Course   MDM:  73 year old presenting to the emergency department with cough.  Patient overall well-appearing, physical examination with some diffuse coarse breath sounds with no focal finding.  Differential includes  bronchitis, pneumonia, aspiration.  Will obtain repeat chest x-ray.  If this is unrevealing given her persistent symptoms would consider CT chest.  Clinical Course as of 08/08/23 1754  Mon Aug 08, 2023  1752 X-ray clear.  CT scan obtained with no evidence of acute process.  Discussed findings with the patient.  She reports breathing treatment home so we will prescribe inhaler and cough medication.  Advise follow-up with PMD.  Suspect bronchitis.  Do not think patient would benefit from further antibiotic or steroids. Will discharge patient to home. All questions answered. Patient comfortable with plan of discharge. Return precautions discussed with patient and specified on the after visit summary.  [WS]    Clinical Course User Index [WS] Mordecai Applebaum, MD     Additional history obtained:  -External records from outside source obtained and reviewed including: Chart review including previous notes, labs, imaging, consultation notes including PMD note    Lab Tests: -I ordered, reviewed, and interpreted labs.   The pertinent results include:   Labs Reviewed  COMPREHENSIVE METABOLIC PANEL WITH GFR - Abnormal; Notable for the following components:      Result Value   Glucose, Bld 235 (*)    Creatinine, Ser 1.07 (*)    GFR, Estimated 55 (*)    All other components within normal limits  CBC WITH DIFFERENTIAL/PLATELET - Abnormal; Notable for the following components:   Platelets 136 (*)    All other components within normal limits  RESP PANEL BY RT-PCR (RSV, FLU A&B, COVID)  RVPGX2    Notable for mild AKI, thrombocytopenia, discussed with patient   EKG   EKG Interpretation Date/Time:  Monday August 08 2023 15:22:50 EDT Ventricular Rate:  85 PR Interval:  131 QRS Duration:  85 QT Interval:  342 QTC Calculation: 407 R Axis:   42  Text Interpretation: Sinus rhythm RAE, consider biatrial enlargement Borderline repolarization abnormality Compared to previous tracing Nonspecific ST  abnormality is unchanged Confirmed by Hiawatha Lout ([id number]) on 08/08/2023 3:41:33 PM         Imaging Studies ordered: I ordered imaging studies including CXR, CT On my interpretation imaging demonstrates no acute process I independently visualized and interpreted imaging. I agree with the radiologist interpretation   Medicines ordered and prescription drug management: Meds ordered this encounter  Medications   ipratropium-albuterol (DUONEB) 0.5-2.5 (3) MG/3ML nebulizer solution 3 mL   lactated ringers bolus 1,000 mL   iohexol (OMNIPAQUE) 350 MG/ML injection 75 mL   albuterol (VENTOLIN HFA) 108 (90 Base) MCG/ACT inhaler    Sig: Inhale 1-2 puffs into the lungs every 6 (six) hours as needed for wheezing or shortness of breath.    Dispense:  6.7 g    Refill:  0   benzonatate  (TESSALON ) 100 MG capsule    Sig: Take 2 capsules (200 mg total) by mouth 3 (three) times daily as needed for cough.    Dispense:  21 capsule    Refill:  0    -I have reviewed the patients home medicines and have made adjustments as needed   Social Determinants of Health:  Diagnosis or treatment significantly limited by social determinants of health: obesity   Reevaluation: After the interventions noted above, I reevaluated the patient and found that their symptoms have improved  Co morbidities that complicate the patient evaluation  Past Medical History:  Diagnosis Date   Allergic rhinitis    Allergy Penicillin   Arthritis    foot hands,legs   Back pain    Back pain, chronic    Cancer (HCC)    breast,left   Fibromyalgia    sees Dr.Davenshwar   GERD (gastroesophageal reflux disease)    HPV (human papilloma virus) infection    Hyperglycemia    Hypertension    Insomnia    Joint pain    Lactose intolerance    Menopause    on lexapro  was started by gyn   Osteopenia    Prediabetes    RLS (restless legs syndrome)    Scoliosis    Stricture esophagus    last EGD and dilatation 3/09    Syncope 07/2009   admitted thought to be due to over treatment of hypertension   Thyromegaly    Vertigo    went to ED on 10/30      Dispostion: Disposition decision including need for hospitalization was considered, and patient discharged from emergency department.    Final Clinical Impression(s) / ED Diagnoses Final diagnoses:  Bronchitis     This chart was dictated using voice recognition software.  Despite best efforts to proofread,  errors can occur which can change the documentation meaning.    Mordecai Applebaum, MD 08/08/23 1754

## 2023-08-08 NOTE — Telephone Encounter (Signed)
 Copied from CRM [id number]. Topic: Clinical - Medical Advice >> Aug 08, 2023 11:48 AM Alyse July wrote: Reason for CRM: Patient would like a call back from [facility] pertaining to the congestion she previously saw provider in the office for. [phone]

## 2023-08-08 NOTE — ED Triage Notes (Signed)
 Cough x 1 week , was out of town and was prescribed prednisone  and "some" antibiotic , on day 5 of the treatment. Shortness of breath and wheezing . Mid chest heaving from coughing she states.

## 2023-08-08 NOTE — Discharge Instructions (Addendum)
 We evaluated you for your cough.  Your chest x-ray and CT scan were clear.  We did not see any sign of a pneumonia or any other dangerous findings.  Your blood test showed slightly low platelets and slight dehydration.  We do not think this is related to your cough.  Follow-up with your primary doctor for further care.  If you have any worsening symptoms return to the emergency department.

## 2023-08-09 NOTE — Telephone Encounter (Signed)
 Spoke with pt, pt states she was seen at the ED and "she is fine" and then hung up the call.

## 2023-08-17 ENCOUNTER — Telehealth (INDEPENDENT_AMBULATORY_CARE_PROVIDER_SITE_OTHER): Admitting: Internal Medicine

## 2023-08-17 ENCOUNTER — Encounter: Payer: Self-pay | Admitting: Internal Medicine

## 2023-08-17 VITALS — Ht 67.0 in

## 2023-08-17 DIAGNOSIS — R051 Acute cough: Secondary | ICD-10-CM

## 2023-08-17 DIAGNOSIS — J9801 Acute bronchospasm: Secondary | ICD-10-CM | POA: Diagnosis not present

## 2023-08-17 DIAGNOSIS — D696 Thrombocytopenia, unspecified: Secondary | ICD-10-CM

## 2023-08-17 DIAGNOSIS — R7989 Other specified abnormal findings of blood chemistry: Secondary | ICD-10-CM | POA: Diagnosis not present

## 2023-08-17 MED ORDER — BUDESONIDE-FORMOTEROL FUMARATE 160-4.5 MCG/ACT IN AERO: 2.0000 | INHALATION_SPRAY | Freq: Two times a day (BID) | RESPIRATORY_TRACT | 3 refills | Status: DC

## 2023-08-17 NOTE — Progress Notes (Unsigned)
 Subjective:    Patient ID: Leah Cameron, female    DOB: [birth year], 73 y.o.   MRN: [id number]  DOS:  08/17/2023 Type of visit - description: Virtual Visit via Video Note  I connected with the above patient  by a video enabled telemedicine application and verified that I am speaking with the correct person using two identifiers.   Location of patient: home  Location of provider: office  Persons participating in the virtual visit: patient, provider   I discussed the limitations of evaluation and management by telemedicine and the availability of in person appointments. The patient expressed understanding and agreed to proceed.  Follow-up The patient was in [facility], she noted something went through "the wrong pipe" while eating, went to urgent care there, Rx doxycycline , metronidazole and was told she likely needs a CT chest. Was seen at this office,08/05/2023, aspiration pneumonia was in the DDx, guarded status chest x-ray: Negative. Subsequently went to the ER 08/08/2023 as she was mildly short of breath and she had persistent symptoms. Workup extensively reviewed. Was prescribed inhaler.  She calls today because she is still feeling quite weak. Her chest is still congested, had some wheezing, using the inhaler prescribed at the ER, wheezing slightly better. Still has some cough with yellowish phlegm with no blood. Sinuses still congested. Denies any fever or chills. Admits to mild shortness of breath. Denies heartburn.  No dysphagia or odynophagia. Like to discuss all the results from emergency room.     I discussed the assessment and treatment plan with the patient. The patient was provided an opportunity to ask questions and all were answered. The patient agreed with the plan and demonstrated an understanding of the instructions.   The patient was advised to call back or seek an in-person evaluation if the symptoms worsen or if the condition fails to improve as  anticipated.   NO NEED FOR TIME ID DOCUMENTATION SUPPORTS A LEVEL III OR IV OK TO USE TEMPLATE AS BELOW IF NEEDED   Today, I spent more than    min with the patient: >50% of the time counseling regards  Also: -reviewing the chart and labs ordered by other providers  -coordinating his care      Review of Systems See above   Past Medical History:  Diagnosis Date   Allergic rhinitis    Allergy Penicillin   Arthritis    foot hands,legs   Back pain    Back pain, chronic    Cancer (HCC)    breast,left   Fibromyalgia    sees Dr.Davenshwar   GERD (gastroesophageal reflux disease)    HPV (human papilloma virus) infection    Hyperglycemia    Hypertension    Insomnia    Joint pain    Lactose intolerance    Menopause    on lexapro  was started by gyn   Osteopenia    Prediabetes    RLS (restless legs syndrome)    Scoliosis    Stricture esophagus    last EGD and dilatation 3/09   Syncope 07/2009   admitted thought to be due to over treatment of hypertension   Thyromegaly    Vertigo    went to ED on 10/30    Past Surgical History:  Procedure Laterality Date   BACK SURGERY  09/05/2013   cage and rods L4 L5 (@ [facility])   BIOPSY THYROID   01/2009   negative    BREAST BIOPSY Left 09/06/2017   BREAST EXCISIONAL BIOPSY Left 2000  BUNIONECTOMY Bilateral    COLONOSCOPY     KNEE ARTHROSCOPY  2003   right x1   KNEE SURGERY Left 1999/2002   left  x2   SHOULDER SURGERY  2004   SPINE SURGERY  08/2013   TONSILLECTOMY     TRIGGER FINGER RELEASE  08/2014   R THUMB   TUBAL LIGATION  06/1979    Current Outpatient Medications  Medication Instructions   albuterol  (VENTOLIN  HFA) 108 (90 Base) MCG/ACT inhaler 1-2 puffs, Inhalation, Every 6 hours PRN   amLODipine  (NORVASC ) 5 mg, Oral, Daily   azelastine  (ASTELIN ) 0.1 % nasal spray 2 sprays, Each Nare, 2 times daily   benazepril  (LOTENSIN ) 40 mg, Oral, Daily   benzonatate  (TESSALON ) 200 mg, Oral, 3 times daily PRN   Calcium  Carbonate-Vitamin D  600-400 MG-UNIT chew tablet 1 tablet, 2 times daily   escitalopram  (LEXAPRO ) 10 mg, Oral, Daily   fluticasone  (FLONASE ) 50 MCG/ACT nasal spray 2 sprays, Each Nare, Daily   Multiple Vitamin (MULTIVITAMIN WITH MINERALS) TABS 1 tablet, Daily   spironolactone  (ALDACTONE ) 25 mg, Oral, Daily   zolpidem  (AMBIEN ) 10 mg, Oral, At bedtime PRN, for sleep       Objective:   Physical Exam Ht 5\' 7"  (1.702 m)   LMP  (LMP Unknown)   BMI 31.98 kg/m  This is a virtual video visit, alert oriented x 3, in no apparent distress, no cough or wheezing noted, speaking complete sentences, no vital signs available.    Assessment    Problem list Prediabetes HTN --hypokalemia, ?primary hyperaldosteronism Anxiety, insomnia --- start to rx ambien  here ~ 04-2015 (previously by rheumatology) Multinodular goiter, per ENDO  ---01-2009 thyroid  Bx (-), BX again 11-2014.   GI: --GERD  --esophageal stricture, dilatation  2009 [facility]: --fibromyalgia >>> stop seen Dr Ilean Mall by the end of 2016 b/c she felt  better  --Chronic back pain -- resolved after 2015 back surgery --RLS -- Osteopenia:   11/28/13 Tscore -2.7;   09/09/16 T score -2.4; Dexa ([facility])  09/04/2019: T-score -2.4 Syncope 2011 (? due to over rx of HTN) Self dc ASA 2016 d/t easy bruising   PLAN: Cough, bronchospasm Symptoms started acutely after something when "down the wrong pipe" while eating, she has cough, bronchospasm, some shortness of breath. Has been seen in the urgent care, this office in the ER. CT chest showed no PE or pneumonia. Bronchospasm slightly better, still coughing some, she reports feeling extremely fatigued. Suspect bronchospasm related to either small amount of aspiration without pneumonia Plan: Switch to Symbicort 2 puffs twice daily, for nasal congestion Astelin  and Flonase . Reassess next month. Other issues: DM: The patient noted her blood sugar to be elevated and is concerned about it.  She has very mild  diabetes, diet controlled, elevated CBG likely due to stress while in the ER.  Recheck A1c when she comes back Increased creatinine: Creatinine is was slightly elevated, she is concerned about it, will recheck when she comes back Thrombocytopenia: Mild, chronic, no other cells affected.  My plan so far has been monitoring, since she is concerned about it we will recheck when she comes back and consider hematology referral. RTC next month.  We agreed she will call the office and set up an appointment. 30 minutes

## 2023-08-18 NOTE — Assessment & Plan Note (Signed)
 Cough, bronchospasm Symptoms started acutely after something when "down the wrong pipe" while eating, she has cough, bronchospasm, some shortness of breath. Has been seen in the urgent care, this office and at the ER. CT chest showed no PE or pneumonia. Bronchospasm slightly better, still coughing some, she reports feeling extremely fatigued. Suspect bronchospasm related to a small amount of aspiration without pneumonia Plan: Switch to Symbicort 2 puffs twice daily, for nasal congestion Astelin  and Flonase . Reassess next month. Other issues: DM: The patient noted her blood sugar to be elevated and is concerned about it.  Had early DM, diet controlled, elevated CBG likely due to stress while in the ER.  Recheck A1c when she comes back Increased creatinine: Creatinine is was slightly elevated, she is concerned about it, will recheck when she comes back Thrombocytopenia: Mild, chronic, no other cells affected.  My plan so far has been monitoring, since she is concerned about it we will recheck when she comes back and consider hematology referral. RTC next month.  We agreed she will call the office and set up an appointment.

## 2023-08-19 ENCOUNTER — Telehealth: Payer: Self-pay

## 2023-08-19 ENCOUNTER — Other Ambulatory Visit (HOSPITAL_COMMUNITY): Payer: Self-pay

## 2023-08-19 ENCOUNTER — Encounter: Payer: Self-pay | Admitting: Internal Medicine

## 2023-08-19 MED ORDER — AIRSUPRA 90-80 MCG/ACT IN AERO: 2.0000 | INHALATION_SPRAY | Freq: Four times a day (QID) | RESPIRATORY_TRACT | 1 refills | Status: DC | PRN

## 2023-08-19 NOTE — Telephone Encounter (Signed)
 Pharmacy Patient Advocate Encounter   Received notification from Patient Pharmacy that prior authorization for Airsupra is required/requested.   Insurance verification completed.   The patient is insured through [employer] .   Per test claim: PA required; PA submitted to above mentioned insurance via CoverMyMeds Key/confirmation #/EOC [id number] Status is pending

## 2023-08-22 ENCOUNTER — Other Ambulatory Visit: Payer: Self-pay | Admitting: Internal Medicine

## 2023-08-23 ENCOUNTER — Ambulatory Visit

## 2023-08-23 NOTE — Telephone Encounter (Signed)
 Pharmacy Patient Advocate Encounter  Received notification from [employer] that Prior Authorization for Leah Cameron has been DENIED.  Full denial letter will be uploaded to the media tab. See denial reason below.   PA #/Case ID/Reference #: [id number]

## 2023-08-23 NOTE — Telephone Encounter (Signed)
 Would you call her for me? Thank you.

## 2023-08-23 NOTE — Telephone Encounter (Signed)
 Advise patient: Insurance has denied  Symbicort , and now has denied Airsupra. Recommend to take albuterol  as needed. If she is still coughing: Refer to pulmonary

## 2023-08-23 NOTE — Telephone Encounter (Signed)
 Spoke with pt, pt is aware and expressed understanding.

## 2023-09-01 DIAGNOSIS — N951 Menopausal and female climacteric states: Secondary | ICD-10-CM | POA: Diagnosis not present

## 2023-09-01 DIAGNOSIS — G4700 Insomnia, unspecified: Secondary | ICD-10-CM | POA: Diagnosis not present

## 2023-09-01 DIAGNOSIS — M797 Fibromyalgia: Secondary | ICD-10-CM | POA: Diagnosis not present

## 2023-09-01 DIAGNOSIS — J309 Allergic rhinitis, unspecified: Secondary | ICD-10-CM | POA: Diagnosis not present

## 2023-09-01 DIAGNOSIS — I10 Essential (primary) hypertension: Secondary | ICD-10-CM | POA: Diagnosis not present

## 2023-09-13 ENCOUNTER — Encounter (INDEPENDENT_AMBULATORY_CARE_PROVIDER_SITE_OTHER): Payer: Self-pay | Admitting: Physician Assistant

## 2023-09-13 ENCOUNTER — Ambulatory Visit (INDEPENDENT_AMBULATORY_CARE_PROVIDER_SITE_OTHER): Admitting: Physician Assistant

## 2023-09-13 VITALS — BP 137/83 | HR 92 | Temp 98.3°F | Ht 67.0 in | Wt 200.0 lb

## 2023-09-13 DIAGNOSIS — R5383 Other fatigue: Secondary | ICD-10-CM

## 2023-09-13 DIAGNOSIS — E559 Vitamin D deficiency, unspecified: Secondary | ICD-10-CM

## 2023-09-13 DIAGNOSIS — I10 Essential (primary) hypertension: Secondary | ICD-10-CM

## 2023-09-13 DIAGNOSIS — R7303 Prediabetes: Secondary | ICD-10-CM | POA: Diagnosis not present

## 2023-09-13 DIAGNOSIS — R0602 Shortness of breath: Secondary | ICD-10-CM

## 2023-09-13 DIAGNOSIS — Z6831 Body mass index (BMI) 31.0-31.9, adult: Secondary | ICD-10-CM

## 2023-09-13 DIAGNOSIS — E042 Nontoxic multinodular goiter: Secondary | ICD-10-CM | POA: Diagnosis not present

## 2023-09-13 DIAGNOSIS — F411 Generalized anxiety disorder: Secondary | ICD-10-CM

## 2023-09-13 DIAGNOSIS — E66811 Obesity, class 1: Secondary | ICD-10-CM | POA: Diagnosis not present

## 2023-09-13 NOTE — Progress Notes (Signed)
 SUBJECTIVE: Discussed the use of AI scribe software for clinical note transcription with the patient, who gave verbal consent to proceed.  Chief Complaint: Obesity  Interim History: She is here to re-establish with [facility]. Last in office visit with Dr. 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    Leah Cameron is here to discuss her progress with her obesity treatment plan. She is on the no specific plan during her absence, but has been more mindful of protein intake  and states she is not following her eating plan approximately 0 % of the time. She states she is not exercising 0 minutes 0 times per week.  Leah Cameron is a 73 year old female who presents to re-establish care for obesity treatment.  She is resuming her obesity treatment after pausing her healthy weight and wellness program, which she initially started in August 2024. She feels frustrated with the slow progress but has been trying to maintain some healthy eating habits, including increasing her protein intake through plant-based protein drinks.   Repeat IC today:  She underwent a repeat indirect calorimetry test earlier today, which showed a resting energy expenditure of 1670 calories, higher than the predicted 1496 calories and higher her previous measurement of 1411 calories in July 2024. She has not been following a structured nutrition plan recently but was previously on a category two plan. The Category 2 plan should provide adequate calories and protein and promote healthy weight loss at this time.   Her medical history includes prediabetes, hypertension, vitamin D  deficiency, emotional eating, fibromyalgia, anxiety, multinodular goiter, thrombocytopenia, restless leg syndrome, and gastroesophageal reflux disease with an esophageal stricture. She is currently taking Norvasc  (amlodipine ) and Lotensin  for blood pressure, calcium and vitamin D  supplements, Lexapro  for  anxiety, and Ambien  for sleep.  She has been under significant stress due to family responsibilities, including caring for her sister with Alzheimer's and supporting her great nephew with a terminal illness, impacting her ability to focus on her health and wellness goals. She wants to get back on track with her treatment plan.   In terms of physical activity, she had been walking for at least 30 minutes three times per week but has not been able to maintain this routine consistently. She reports a weight gain of about 7 pounds, which she notices in the fit of her clothes.  Her energy levels fluctuate depending on her activities, such as visiting and caring for her sister, which can be exhausting.  She is currently fasting for lab work to check her vitamin levels, kidney function, liver function, electrolytes, and thyroid  function.  Fasting labs obtained today.  The patient was informed we would discuss the lab results at the next visit unless there is a critical issue that needs to be addressed sooner. The patient agreed to keep the next visit at the agreed upon time to discuss these results.   OBJECTIVE: Visit Diagnoses: Problem List Items Addressed This Visit     Multinodular goiter   Relevant Orders   TSH   Essential hypertension--hypokalemia, question of primary hyperaldosteronism   Anxiety state   Prediabetes   Relevant Orders   CMP14+EGFR   Hemoglobin A1c   Insulin , random   Lipid Panel With LDL/HDL Ratio   BMI 31.0-31.9,adult   Class 1 obesity with serious comorbidity and body mass index (BMI) of 31.0 to 31.9 in adult   Other Visit Diagnoses  SOBOE (shortness of breath on exertion)    -  Primary     Vitamin D  deficiency       Relevant Orders   VITAMIN D  25 Hydroxy (Vit-D Deficiency, Fractures)     Other fatigue       Relevant Orders   Vitamin B12   CBC with Differential/Platelet     SOBOE Indirect calorimetry test earlier today, which showed a resting energy  expenditure of 1670 calories, higher than the predicted 1496 calories and her previous measurement of 1411 calories in July 2024. She has not been following a structured nutrition plan recently but was previously on a category two plan. The Cat. 2 plan should promote gradual weight loss given her current REE of 1670 calories.    Obesity Leah Cameron is resuming obesity treatment after a pause due to personal circumstances.  Her resting energy expenditure is 1670 calories, higher than predicted and previous measurements. She has gained approximately 7 pounds with decreased muscle mass and is frustrated with slow weight loss progress but has not been focused on her program due to caregiver role over the past 6 months. She was down a total of 6 lbs previously, but has experienced some weight regain over the past 7 months.  - Resume a category two nutrition plan (1300-1400 calories per day and 85+ grams of protein) - Supplement with protein shakes as needed - Encourage regular exercise to increase muscle mass - Recheck labs: kidney function, liver function, electrolytes, vitamin levels, complete blood count, thyroid  function, A1c, insulin  levels, and lipids   Prediabetes Last A1c was 6.5- In diabetic range . This is the highest recent recorded A1c.  Medication(s): None Polyphagia:Yes Lab Results  Component Value Date   HGBA1C 6.5 04/20/2023   HGBA1C 6.3 09/27/2022   HGBA1C 6.4 03/23/2022   HGBA1C 6.3 12/30/2021   HGBA1C 6.1 07/03/2021   Lab Results  Component Value Date   INSULIN  11.7 11/09/2022    Plan:  Continue working on nutrition plan to decrease simple carbohydrates, increase lean proteins and exercise to promote weight loss, improve glycemic control and prevent progression to Type 2 diabetes.  Recheck fasting labs and if A1c remains elevated, consider starting medication for management.   Hypertension She is on Norvasc  (amlodipine ) and Lotensin  for blood pressure  management. Some concerns for hyperaldosteronism, but last K normal range.  Renal function slightly impaired.  BP Readings from Last 3 Encounters:  09/13/23 137/83  08/08/23 (!) 140/69  08/05/23 (!) 142/88   Lab Results  Component Value Date   NA 135 08/08/2023   CL 98 08/08/2023   K 4.5 08/08/2023   CO2 22 08/08/2023   BUN 18 08/08/2023   CREATININE 1.07 (H) 08/08/2023   GFRNONAA 55 (L) 08/08/2023   CALCIUM 10.1 08/08/2023   ALBUMIN 4.4 08/08/2023   GLUCOSE 235 (H) 08/08/2023   Continue to work on nutrition plan to promote weight loss and improve BP control.  Continue usual antihypertensives- amlodipine  5 mg daily and Lotensin  40 mg daily.  Recheck labs today.   Anxiety She is on Lexapro  10 mg daily for many years for anxiety/mood management but patient is uncertain of its effectiveness. Potential medication changes may be discussed if cravings for unhealthy foods persist. - Discuss potential changes to anxiety medication if needed  Vitamin D  Deficiency She is taking calcium 600 mg and vitamin D  400 mg supplements. Vitamin levels will be rechecked to ensure adequacy. Last vitamin D  Lab Results  Component Value Date  VD25OH 42.1 11/09/2022   Low vitamin D  levels can be associated with adiposity and may result in leptin resistance and weight gain. Also associated with fatigue.  Currently on vitamin D  supplementation without any adverse effects such as nausea, vomiting or muscle weakness.  - Recheck vitamin D  levels  Thyroid  Nodules She has multinodular thyroid  nodules. Thyroid  function will be checked as part of the lab work. - Check thyroid  function tests She has follow up with Endocrinology in 11/25 with Dr. Rosalea Collin.    Follow-up She is eager to resume her weight management program and prefers not to delay follow-up appointments. - Schedule follow-up appointment in 2-3 weeks - Contact with lab results if any abnormalities are found Vitals Temp: 98.3 F (36.8  C) BP: 137/83 Pulse Rate: 92 SpO2: 97 %   Anthropometric Measurements Height: 5\' 7"  (1.702 m) Weight: 200 lb (90.7 kg) BMI (Calculated): 31.32 Weight at Last Visit: 195 lb (Last visit 02/16/2023) Weight Lost Since Last Visit: 0 Weight Gained Since Last Visit: 5 lb Starting Weight: 201 lb Total Weight Loss (lbs): 1 lb (0.454 kg)   Body Composition  Body Fat %: 42.7 % Fat Mass (lbs): 85.4 lbs Muscle Mass (lbs): 109 lbs Total Body Water (lbs): 71.2 lbs Visceral Fat Rating : 13   Other Clinical Data Fasting: Yes Labs: Yes Today's Visit #: 7 Starting Date: 11/09/22 Comments: Re-starting the program (IC DONE TODAY)     ASSESSMENT AND PLAN:  Diet: Alila is currently in the action stage of change. As such, her goal is to get back to weight loss efforts. She has agreed to Category 2 Plan.  Exercise: Tenea has been instructed to try a geriatric exercise plan and that some exercise is better than none for weight loss and overall health benefits.   Behavior Modification:  We discussed the following Behavioral Modification Strategies today: increasing lean protein intake, decreasing simple carbohydrates, increasing vegetables, increase H2O intake, increase high fiber foods, no skipping meals, meal planning and cooking strategies, avoiding temptations, and planning for success. We discussed various medication options to help Evon with her weight loss efforts and we both agreed to work on getting back on track with her nutrition plan and exercise to promote healthy weight loss.  Return in about 2 weeks (around 09/27/2023).Aaron Aas She was informed of the importance of frequent follow up visits to maximize her success with intensive lifestyle modifications for her multiple health conditions.  Attestation Statements:   Reviewed by clinician on day of visit: allergies, medications, problem list, medical history, surgical history, family history, social history, and previous encounter  notes.   Time spent on visit including pre-visit chart review and post-visit care and charting was 44 minutes.    Leah Simson, PA-C

## 2023-09-14 ENCOUNTER — Ambulatory Visit (INDEPENDENT_AMBULATORY_CARE_PROVIDER_SITE_OTHER): Payer: Self-pay | Admitting: Physician Assistant

## 2023-09-14 LAB — LIPID PANEL WITH LDL/HDL RATIO
Cholesterol, Total: 174 mg/dL (ref 100–199)
HDL: 45 mg/dL (ref >39)
LDL Chol Calc (NIH): 111 mg/dL — ABNORMAL HIGH (ref 0–99)
LDL/HDL Ratio: 2.5 ratio (ref 0.0–3.2)
Triglycerides: 95 mg/dL (ref 0–149)
VLDL Cholesterol Cal: 18 mg/dL (ref 5–40)

## 2023-09-14 LAB — CBC WITH DIFFERENTIAL/PLATELET
Basophils Absolute: 0.1 10*3/uL (ref 0.0–0.2)
Basos: 1 %
EOS (ABSOLUTE): 0.2 10*3/uL (ref 0.0–0.4)
Eos: 4 %
Hematocrit: 39.4 % (ref 34.0–46.6)
Hemoglobin: 12.3 g/dL (ref 11.1–15.9)
Immature Grans (Abs): 0 10*3/uL (ref 0.0–0.1)
Immature Granulocytes: 0 %
Lymphocytes Absolute: 1.7 10*3/uL (ref 0.7–3.1)
Lymphs: 33 %
MCH: 26.3 pg — ABNORMAL LOW (ref 26.6–33.0)
MCHC: 31.2 g/dL — ABNORMAL LOW (ref 31.5–35.7)
MCV: 84 fL (ref 79–97)
Monocytes Absolute: 0.4 10*3/uL (ref 0.1–0.9)
Monocytes: 8 %
Neutrophils Absolute: 2.7 10*3/uL (ref 1.4–7.0)
Neutrophils: 54 %
Platelets: 151 10*3/uL (ref 150–450)
RBC: 4.67 x10E6/uL (ref 3.77–5.28)
RDW: 13.7 % (ref 11.7–15.4)
WBC: 5.1 10*3/uL (ref 3.4–10.8)

## 2023-09-14 LAB — CMP14+EGFR
ALT: 19 IU/L (ref 0–32)
AST: 21 IU/L (ref 0–40)
Albumin: 4.4 g/dL (ref 3.8–4.8)
Alkaline Phosphatase: 82 IU/L (ref 44–121)
BUN/Creatinine Ratio: 19 (ref 12–28)
BUN: 19 mg/dL (ref 8–27)
Bilirubin Total: 0.4 mg/dL (ref 0.0–1.2)
CO2: 22 mmol/L (ref 20–29)
Calcium: 9.9 mg/dL (ref 8.7–10.3)
Chloride: 102 mmol/L (ref 96–106)
Creatinine, Ser: 1.02 mg/dL — ABNORMAL HIGH (ref 0.57–1.00)
Globulin, Total: 2.2 g/dL (ref 1.5–4.5)
Glucose: 98 mg/dL (ref 70–99)
Potassium: 4.5 mmol/L (ref 3.5–5.2)
Sodium: 139 mmol/L (ref 134–144)
Total Protein: 6.6 g/dL (ref 6.0–8.5)
eGFR: 58 mL/min/{1.73_m2} — ABNORMAL LOW (ref >59)

## 2023-09-14 LAB — INSULIN, RANDOM: INSULIN: 14.6 u[IU]/mL (ref 2.6–24.9)

## 2023-09-14 LAB — HEMOGLOBIN A1C
Est. average glucose Bld gHb Est-mCnc: 137 mg/dL
Hgb A1c MFr Bld: 6.4 % — ABNORMAL HIGH (ref 4.8–5.6)

## 2023-09-14 LAB — VITAMIN D 25 HYDROXY (VIT D DEFICIENCY, FRACTURES): Vit D, 25-Hydroxy: 43.4 ng/mL (ref 30.0–100.0)

## 2023-09-14 LAB — VITAMIN B12: Vitamin B-12: 561 pg/mL (ref 232–1245)

## 2023-09-14 LAB — TSH: TSH: 0.543 u[IU]/mL (ref 0.450–4.500)

## 2023-09-20 ENCOUNTER — Ambulatory Visit (INDEPENDENT_AMBULATORY_CARE_PROVIDER_SITE_OTHER): Admitting: Physician Assistant

## 2023-09-26 NOTE — Progress Notes (Signed)
 SUBJECTIVE: Discussed the use of AI scribe software for clinical note transcription with the patient, who gave verbal consent to proceed.  Chief Complaint: Obesity  Interim History: She is down 1 lb since her last visit. Down 2 lbs overall. Just restarting HWW program.   Muscle mass + 1.2 lbs Adipose mass -2.0 lbs  Jaline is here to discuss her progress with her obesity treatment plan. She is on the Category 2 Plan and states she is following her eating plan approximately 85 % of the time. She states she is exercising by riding a stationary bike a mile 4 times per week.  Luvina Poirier is a 73 year old female with hypertension and prediabetes who presents for follow-up of her treatment plan.  Her blood pressure was elevated today, initially measuring 164/88 and then 152/88 upon recheck. She took her medications, including Norvasc , Lotensin , and Aldactone , approximately two hours before the appointment. She reports having to deal with an insurance issue prior to coming to the clinic and was upset by this interaction.   She has prediabetes with an A1c of 6.4, previously 6.5 x 1. She is working on her diet and exercise, riding a stationary bike for a mile four times per week, and has lost one pound since her last visit. She follows a category 2 nutrition program approximately 85% of the time.  Her creatinine level was higher in April and her glomerular filtration rate is 58- slightly down from previous . She has seen a kidney specialist due to high potassium levels, which led to a change in medication to spironolactone . She has stopped taking Benadryl and Aleve due to concerns about kidney health. She uses Aleve occasionally for pain after physical activity but is considering alternatives like topical treatments.  Her LDL cholesterol is higher than her previous value, while her triglycerides are 95 and her HDL is 45. She is focusing on improving her diet to address these  levels.  She has a history of vitamin D  deficiency, but her current vitamin D  levels are close to the goal, and she is taking calcium with vitamin D . Her B12 levels are also good, and she is not currently taking any B complex vitamins.  She lives in [place] with her husband . Her family history includes both parents having suffered massive strokes, raising her concern about her own blood pressure management.  OBJECTIVE: Visit Diagnoses: Problem List Items Addressed This Visit     Essential hypertension--hypokalemia, question of primary hyperaldosteronism - Primary   Prediabetes   Relevant Medications   topiramate (TOPAMAX) 25 MG tablet   BMI 31.0-31.9,adult   Class 1 obesity with serious comorbidity and body mass index (BMI) of 31.0 to 31.9 in adult   Other Visit Diagnoses       Decreased renal function         Hyperlipidemia, unspecified hyperlipidemia type         Vitamin D  deficiency           Results LABS Creatinine: elevated (07/2023) GFR: 58 (07/2023) LDL: elevated Triglycerides: 95 HDL: 45 Vitamin D : 50-70 B12: 561 Hemoglobin: 12.3 Hematocrit: 39 WBC: normal A1c: 6.4 Insulin : 14 TSH: within normal range  Obesity treatment Following Category 2 plan 85% of the time and riding a stationary bike four times a week. She has lost one pound since the last visit. Muscle mass has increased by 1.2 pounds, and adipose tissue has decreased by 2 pounds. Visceral adipose rating is at goal now of 12.  Emphasis on high fiber foods and protein intake to aid in weight management. - Continue current exercise regimen and category 2 nutrition plan - Focus on high fiber foods and protein intake to aid in weight management.  Hypertension Blood pressure is elevated today, with a reading of 164/88 initially, then 152/88. She reports some dizziness earlier today which has resolved.  Current antihypertensive medications include amlodipine , benazepril , and spironolactone , taken two hours  prior to the appointment, possibly not fully effective at this time. Previous blood pressure readings were improved but not at target. Concerns about kidney function due to elevated creatinine and decreased GFR, potentially affected by blood pressure control. BP Readings from Last 3 Encounters:  09/27/23 (!) 152/88  09/13/23 137/83  08/08/23 (!) 140/69   Lab Results  Component Value Date   NA 139 09/13/2023   CL 102 09/13/2023   K 4.5 09/13/2023   CO2 22 09/13/2023   BUN 19 09/13/2023   CREATININE 1.02 (H) 09/13/2023   EGFR 58 (L) 09/13/2023   CALCIUM 9.9 09/13/2023   ALBUMIN 4.4 09/13/2023   GLUCOSE 98 09/13/2023    - Monitor blood pressure at home and maintain a log and bring to next visit. - Avoid nephrotoxic medications such as Aleve for renal function preservation. - Contact primary care physician if blood pressure consistently exceeds 140/85-90 consistently.   Mild decreased renal function  Elevated creatinine and decreased GFR (58, normal >59) indicate mild kidney function changes. Potassium issues managed with spironolactone . Avoidance of nephrotoxic medications is crucial to prevent further kidney damage. Monitoring is essential due to the risk of worsening kidney function if blood pressure is not well controlled. - Monitor kidney function regularly. Keep BP in goal range of < 130/80 - Avoid nephrotoxic medications such as Aleve. - Recheck kidney function in a couple of months.  Prediabetes with cravings A1c is 6.4, on the cusp of type 2 diabetes. Previous A1c was 6.5 x 1. Insulin  level is mildly elevated at 14. Emphasis on dietary changes to prevent progression to diabetes.  Patient denies history of glaucoma or kidney stones. She was informed of side effects and that topiramate is teratogenic. She is post menopausal.  Topiramate was discussed for appetite control and cravings, with potential benefits of improved insulin  response and A1c reduction. Risks include tingling  in extremities, altered taste for carbonated drinks, and potential fatigue. Benefits include potential weight loss and improved sleep. - Recheck A1c in a couple of months. - Focus on dietary changes to reduce simple carbohydrates and added sugars. - Start topiramate for appetite control and cravings, beginning with 25 mg at evening meal, increasing to 50 mg if tolerated. Meds ordered this encounter  Medications   topiramate (TOPAMAX) 25 MG tablet    Sig: Take 1 tablet (25 mg total) by mouth daily. Increase to 50 mg daily with evening meal if tolerating well after 1 week.    Dispense:  45 tablet    Refill:  0    Hyperlipidemia LDL cholesterol is elevated. Previous levels were within normal range. Triglycerides and HDL are within desired ranges. Dietary changes are recommended to improve LDL levels. Exercise and avoiding saturated fats are emphasized to improve lipid profile. Lab Results  Component Value Date   CHOL 174 09/13/2023   CHOL 160 11/09/2022   CHOL 159 12/30/2021   Lab Results  Component Value Date   HDL 45 09/13/2023   HDL 49 11/09/2022   HDL 44 12/30/2021   Lab Results  Component Value Date  LDLCALC 111 (H) 09/13/2023   LDLCALC 92 11/09/2022   LDLCALC 97 12/30/2021   Lab Results  Component Value Date   TRIG 95 09/13/2023   TRIG 103 11/09/2022   TRIG 99 12/30/2021   Lab Results  Component Value Date   CHOLHDL 4 04/01/2020   CHOLHDL 4 06/13/2018   CHOLHDL 4 11/16/2017   No results found for: LDLDIRECT Slight worsening of LDL cholesterol .  Continue to work on nutrition plan -decreasing simple carbohydrates, increasing lean proteins, decreasing saturated fats and cholesterol , avoiding trans fats and exercise as able to promote weight loss, improve lipids and decrease cardiovascular risks. - Focus on dietary changes to reduce saturated fats and increase exercise. - Monitor cholesterol levels as dietary changes are implemented.  Vitamin D   Deficiency Vitamin D  levels are close to goal (50-70). Currently taking calcium with vitamin D  supplementation. No additional supplementation is needed at this time. Last vitamin D  Lab Results  Component Value Date   VD25OH 43.4 09/13/2023    - Continue current calcium with vitamin D + Calcium supplementation.    Vitals Temp: 98.1 F (36.7 C) BP: (!) 152/88 Pulse Rate: 64 SpO2: 100 %   Anthropometric Measurements Height: 5' 7 (1.702 m) Weight: 199 lb (90.3 kg) BMI (Calculated): 31.16 Weight at Last Visit: 200 lb Weight Lost Since Last Visit: 1 lb Weight Gained Since Last Visit: 0 Starting Weight: 201 lb Total Weight Loss (lbs): 2 lb (0.907 kg)   Body Composition  Body Fat %: 41.8 % Fat Mass (lbs): 83.4 lbs Muscle Mass (lbs): 110.2 lbs Total Body Water (lbs): 69.4 lbs Visceral Fat Rating : 12   Other Clinical Data Fasting: No Labs: No Today's Visit #: 8 Starting Date: 11/09/22     ASSESSMENT AND PLAN:  Diet: Nialah is currently in the action stage of change. As such, her goal is to continue with weight loss efforts. She has agreed to Category 2 Plan.  Exercise: Beverly has been instructed to work up to a goal of 150 minutes of combined cardio and strengthening exercise per week for weight loss and overall health benefits.   Behavior Modification:  We discussed the following Behavioral Modification Strategies today: increasing lean protein intake, decreasing simple carbohydrates, increasing vegetables, increase H2O intake, increase high fiber foods, meal planning and cooking strategies, avoiding temptations, and planning for success. We discussed various medication options to help Tifanie with her weight loss efforts and we both agreed to start topiramate for cravings with prediabetes and continue to work on nutritional and behavioral strategies to promote weight loss.  .  Return in about 6 weeks (around 11/08/2023).Aaron Aas She was informed of the importance of  frequent follow up visits to maximize her success with intensive lifestyle modifications for her multiple health conditions.  Attestation Statements:   Reviewed by clinician on day of visit: allergies, medications, problem list, medical history, surgical history, family history, social history, and previous encounter notes.   Time spent on visit including pre-visit chart review and post-visit care and charting was 48 minutes.    Azion Centrella, PA-C

## 2023-09-27 ENCOUNTER — Ambulatory Visit (INDEPENDENT_AMBULATORY_CARE_PROVIDER_SITE_OTHER): Admitting: Physician Assistant

## 2023-09-27 ENCOUNTER — Encounter (INDEPENDENT_AMBULATORY_CARE_PROVIDER_SITE_OTHER): Payer: Self-pay | Admitting: Physician Assistant

## 2023-09-27 VITALS — BP 152/88 | HR 64 | Temp 98.1°F | Ht 67.0 in | Wt 199.0 lb

## 2023-09-27 DIAGNOSIS — E785 Hyperlipidemia, unspecified: Secondary | ICD-10-CM | POA: Diagnosis not present

## 2023-09-27 DIAGNOSIS — R7303 Prediabetes: Secondary | ICD-10-CM | POA: Diagnosis not present

## 2023-09-27 DIAGNOSIS — N289 Disorder of kidney and ureter, unspecified: Secondary | ICD-10-CM

## 2023-09-27 DIAGNOSIS — R5383 Other fatigue: Secondary | ICD-10-CM

## 2023-09-27 DIAGNOSIS — Z6831 Body mass index (BMI) 31.0-31.9, adult: Secondary | ICD-10-CM

## 2023-09-27 DIAGNOSIS — I10 Essential (primary) hypertension: Secondary | ICD-10-CM | POA: Diagnosis not present

## 2023-09-27 DIAGNOSIS — E876 Hypokalemia: Secondary | ICD-10-CM

## 2023-09-27 DIAGNOSIS — E66811 Obesity, class 1: Secondary | ICD-10-CM | POA: Diagnosis not present

## 2023-09-27 DIAGNOSIS — E042 Nontoxic multinodular goiter: Secondary | ICD-10-CM

## 2023-09-27 DIAGNOSIS — E559 Vitamin D deficiency, unspecified: Secondary | ICD-10-CM

## 2023-09-27 MED ORDER — TOPIRAMATE 25 MG PO TABS: 25.0000 mg | ORAL_TABLET | Freq: Every day | ORAL | 0 refills | Status: DC

## 2023-10-03 ENCOUNTER — Other Ambulatory Visit: Payer: Self-pay | Admitting: Pharmacist

## 2023-10-03 ENCOUNTER — Encounter: Payer: Self-pay | Admitting: Pharmacist

## 2023-10-03 MED ORDER — BENAZEPRIL HCL 40 MG PO TABS: 40.0000 mg | ORAL_TABLET | Freq: Every day | ORAL | 0 refills | Status: AC

## 2023-10-03 MED ORDER — BENAZEPRIL HCL 40 MG PO TABS: 40.0000 mg | ORAL_TABLET | Freq: Every day | ORAL | 1 refills | Status: DC

## 2023-10-03 NOTE — Progress Notes (Signed)
 Pharmacy Quality Measure Review  This patient is appearing on a report for being at risk of failing the adherence measure for hypertension (ACEi/ARB) medications this calendar year.   Medication: benazepril  40mg  Last fill date: 04/27/2023 for 90 day supply  Spoke with patient by phone today. She endorsed that she does need a refill on her benazepril .  Discussed barriers to adherence, which included needing updated RX,  Reviewed medication indication, dosing, and goals of therapy. Sent updated Rx to pharmacy.   Madelin Ray, PharmD Clinical Pharmacist [facility]  Population Health [phone]

## 2023-10-03 NOTE — Telephone Encounter (Signed)
 Patient last has a video visit with PCP 08/17/2023. She had appt 10/19/2023 for 6 month follow up but appt was cancelled for scheduling conflict and it has not been rescheduled yet. Called [facility] - changed Rx for benazepril  for #90 and no refills.   Patient called. She states she is open to seeing Dr Amon if needed prior to zolpidem  refill approval.

## 2023-10-03 NOTE — Telephone Encounter (Signed)
 Patient was past due to have lisinpril refill. She needed an updated Rx sent to [facility].  Prescription sent.  She also asked if zolpidem  Rx could also be sent.   Zolpidem  - 10mg  - take 1 tablet at bedtime as needed.  Last refill: 07/18/2023 Quantity: 90  Refill request sent to PCP.

## 2023-10-03 NOTE — Telephone Encounter (Signed)
 Pt stated in April that she was no longer coming back to see Dr. Amon. If so, she needs to establish with a new PCP.

## 2023-10-13 ENCOUNTER — Ambulatory Visit: Payer: Self-pay | Admitting: Family

## 2023-10-13 ENCOUNTER — Ambulatory Visit: Admitting: Family

## 2023-10-13 ENCOUNTER — Encounter: Payer: Self-pay | Admitting: Family

## 2023-10-13 VITALS — BP 126/84 | HR 75 | Ht 67.0 in | Wt 201.4 lb

## 2023-10-13 DIAGNOSIS — R5383 Other fatigue: Secondary | ICD-10-CM

## 2023-10-13 DIAGNOSIS — G4700 Insomnia, unspecified: Secondary | ICD-10-CM | POA: Diagnosis not present

## 2023-10-13 LAB — CBC WITH DIFFERENTIAL/PLATELET
Basophils Absolute: 0.1 10*3/uL (ref 0.0–0.1)
Basophils Relative: 1.7 % (ref 0.0–3.0)
Eosinophils Absolute: 0.3 10*3/uL (ref 0.0–0.7)
Eosinophils Relative: 7.4 % — ABNORMAL HIGH (ref 0.0–5.0)
HCT: 36.7 % (ref 36.0–46.0)
Hemoglobin: 11.8 g/dL — ABNORMAL LOW (ref 12.0–15.0)
Lymphocytes Relative: 29.6 % (ref 12.0–46.0)
Lymphs Abs: 1.4 10*3/uL (ref 0.7–4.0)
MCHC: 32.3 g/dL (ref 30.0–36.0)
MCV: 81.7 fl (ref 78.0–100.0)
Monocytes Absolute: 0.3 10*3/uL (ref 0.1–1.0)
Monocytes Relative: 7.3 % (ref 3.0–12.0)
Neutro Abs: 2.5 10*3/uL (ref 1.4–7.7)
Neutrophils Relative %: 54 % (ref 43.0–77.0)
Platelets: 133 10*3/uL — ABNORMAL LOW (ref 150.0–400.0)
RBC: 4.49 Mil/uL (ref 3.87–5.11)
RDW: 14.9 % (ref 11.5–15.5)
WBC: 4.6 10*3/uL (ref 4.0–10.5)

## 2023-10-13 LAB — COMPREHENSIVE METABOLIC PANEL WITH GFR
ALT: 14 U/L (ref 0–35)
AST: 15 U/L (ref 0–37)
Albumin: 4.5 g/dL (ref 3.5–5.2)
Alkaline Phosphatase: 66 U/L (ref 39–117)
BUN: 25 mg/dL — ABNORMAL HIGH (ref 6–23)
CO2: 26 meq/L (ref 19–32)
Calcium: 9.9 mg/dL (ref 8.4–10.5)
Chloride: 105 meq/L (ref 96–112)
Creatinine, Ser: 1.08 mg/dL (ref 0.40–1.20)
GFR: 51.46 mL/min — ABNORMAL LOW (ref >60.00)
Glucose, Bld: 94 mg/dL (ref 70–99)
Potassium: 3.9 meq/L (ref 3.5–5.1)
Sodium: 137 meq/L (ref 135–145)
Total Bilirubin: 0.4 mg/dL (ref 0.2–1.2)
Total Protein: 7 g/dL (ref 6.0–8.3)

## 2023-10-13 MED ORDER — ZOLPIDEM TARTRATE 10 MG PO TABS: 10.0000 mg | ORAL_TABLET | Freq: Every evening | ORAL | 0 refills | Status: AC | PRN

## 2023-10-13 NOTE — Progress Notes (Signed)
 Leah Cameron is a 73 y.o. female with the following history as recorded in EpicCare:  Patient Active Problem List   Diagnosis Date Noted   Aspiration pneumonia (HCC) 08/04/2023   Prediabetes 12/08/2022   Depression 12/08/2022   BMI 31.0-31.9,adult 12/08/2022   Class 1 obesity with serious comorbidity and body mass index (BMI) of 31.0 to 31.9 in adult 12/08/2022   Goiter 09/28/2022   Insomnia 01/17/2019   Osteopenia 12/22/2016   PCP NOTES >>>>>>>>>>>>>>>>>>>>>>>>>>>>>>>>>>>> 03/24/2015   Acquired spondylolisthesis 08/14/2013   Annual physical exam 10/26/2011   Palpitations 08/12/2010   MENOPAUSAL SYNDROME 01/01/2009   Anxiety state 01/01/2009   Multinodular goiter 12/21/2006   RESTLESS LEG SYNDROME 12/21/2006   Essential hypertension--hypokalemia, question of primary hyperaldosteronism 12/21/2006   Allergic rhinitis 12/21/2006   STRICTURE, ESOPHAGEAL 12/21/2006   GERD 12/21/2006   Backache 12/21/2006   Fibromyalgia 12/21/2006    Current Outpatient Medications  Medication Sig Dispense Refill   albuterol  (VENTOLIN  HFA) 108 (90 Base) MCG/ACT inhaler Inhale 1-2 puffs into the lungs every 6 (six) hours as needed for wheezing or shortness of breath. 6.7 g 0   amLODipine  (NORVASC ) 5 MG tablet Take 1 tablet (5 mg total) by mouth daily. 90 tablet 1   azelastine  (ASTELIN ) 0.1 % nasal spray Place 2 sprays into both nostrils 2 (two) times daily. (Patient taking differently: Place 2 sprays into both nostrils as needed.) 30 mL 6   benazepril  (LOTENSIN ) 40 MG tablet Take 1 tablet (40 mg total) by mouth daily. 90 tablet 0   Calcium Carbonate-Vitamin D  600-400 MG-UNIT chew tablet Chew 1 tablet by mouth 2 (two) times daily.      escitalopram  (LEXAPRO ) 10 MG tablet Take 1 tablet (10 mg total) by mouth daily. 90 tablet 1   fluticasone  (FLONASE ) 50 MCG/ACT nasal spray Place 2 sprays into both nostrils daily. (Patient taking differently: Place 2 sprays into both nostrils as needed.) 16 g 6    Multiple Vitamin (MULTIVITAMIN WITH MINERALS) TABS Take 1 tablet by mouth daily.     spironolactone  (ALDACTONE ) 25 MG tablet Take 1 tablet (25 mg total) by mouth daily. 90 tablet 1   topiramate  (TOPAMAX ) 25 MG tablet Take 1 tablet (25 mg total) by mouth daily. Increase to 50 mg daily with evening meal if tolerating well after 1 week. 45 tablet 0   zolpidem  (AMBIEN ) 10 MG tablet Take 1 tablet (10 mg total) by mouth at bedtime as needed. for sleep 90 tablet 0   No current facility-administered medications for this visit.    Allergies: Cyclobenzaprine hcl, Chlorzoxazone, Hydrocodone-acetaminophen, Penicillins, and Tramadol hcl  Past Medical History:  Diagnosis Date   Allergic rhinitis    Allergy Penicillin   Arthritis    foot hands,legs   Back pain    Back pain, chronic    Cancer (HCC)    breast,left   Fibromyalgia    sees Dr.Davenshwar   GERD (gastroesophageal reflux disease)    HPV (human papilloma virus) infection    Hyperglycemia    Hypertension    Insomnia    Joint pain    Lactose intolerance    Menopause    on lexapro  was started by gyn   Osteopenia    Prediabetes    RLS (restless legs syndrome)    Scoliosis    Stricture esophagus    last EGD and dilatation 3/09   Syncope 07/2009   admitted thought to be due to over treatment of hypertension   Thyromegaly    Vertigo  went to ED on 10/30    Past Surgical History:  Procedure Laterality Date   BACK SURGERY  09/05/2013   cage and rods L4 L5 (@ [facility])   BIOPSY THYROID   01/2009   negative    BREAST BIOPSY Left 09/06/2017   BREAST EXCISIONAL BIOPSY Left 2000   BUNIONECTOMY Bilateral    COLONOSCOPY     KNEE ARTHROSCOPY  2003   right x1   KNEE SURGERY Left 1999/2002   left  x2   SHOULDER SURGERY  2004   SPINE SURGERY  08/2013   TONSILLECTOMY     TRIGGER FINGER RELEASE  08/2014   R THUMB   TUBAL LIGATION  06/1979    Family History  Problem Relation Age of Onset   Stroke Mother        M, F, 1/2 sister,  sister    Thyroid  disease Mother    Arthritis Mother    Hypertension Mother    Depression Mother    Anxiety disorder Mother    Hypertension Father    Stroke Father    Stroke Sister    Dementia Sister    Hypertension Sister    Early death Brother    Early death Brother    Early death Brother    Heart disease Paternal Grandfather    Miscarriages / [place] Daughter    Breast cancer Other        1/2 sister   Diabetes Other        aunt   Coronary artery disease Other        GF late in life   Lung cancer Other        cousin   Colon cancer Neg Hx    Colon polyps Neg Hx    Crohn's disease Neg Hx    Esophageal cancer Neg Hx    Rectal cancer Neg Hx    Stomach cancer Neg Hx    Ulcerative colitis Neg Hx     Social History   Tobacco Use   Smoking status: Never    Passive exposure: Never   Smokeless tobacco: Never  Substance Use Topics   Alcohol use: Yes    Comment: socially     Subjective:   Seen to discuss refill on Ambien ; her PCP is not available today and she needed updated prescription sent; she is on the way to [facility] today to sit with her sister who is in hospice; she is concerned about being without her medication especially in this stressful situation;  She also admits for the past few weeks she has been feeling some intermittent sensation of feeling lightheaded/ faint. Denies any chest pain, shortness of breath, blurred vision or headache; was recently started on Topamax  with her weight loss provider but feels the symptoms were present before the symptoms began;    Objective:  Vitals:   10/13/23 1306  BP: 126/84  Pulse: 75  SpO2: 100%  Weight: 201 lb 6.4 oz (91.4 kg)  Height: 5' 7 (1.702 m)    General: Well developed, well nourished, in no acute distress  Skin : Warm and dry.  Head: Normocephalic and atraumatic  Eyes: Sclera and conjunctiva clear; pupils round and reactive to light; extraocular movements intact  Ears: External normal; canals  clear; tympanic membranes normal  Oropharynx: Pink, supple. No suspicious lesions  Neck: Supple without thyromegaly, adenopathy  Lungs: Respirations unlabored; clear to auscultation bilaterally without wheeze, rales, rhonchi  CVS exam: normal rate and regular rhythm.  Neurologic: Alert and oriented;  speech intact; face symmetrical; moves all extremities well; CNII-XII intact without focal deficit   Assessment:  1. Insomnia, unspecified type   2. Other fatigue     Plan:  Refill on Ambien - short term refill given since her PCP is unavailable today;  Check CBC, CMP; physical exam is reassuring; ? Possible reaction to Topamax ; she will follow up with her PCP or weight loss provider if symptoms persist.   No follow-ups on file.  Orders Placed This Encounter  Procedures   CBC with Differential/Platelet   Comp Met (CMET)    Requested Prescriptions   Signed Prescriptions Disp Refills   zolpidem  (AMBIEN ) 10 MG tablet 90 tablet 0    Sig: Take 1 tablet (10 mg total) by mouth at bedtime as needed. for sleep

## 2023-10-19 ENCOUNTER — Ambulatory Visit: Payer: Medicare PPO | Admitting: Internal Medicine

## 2023-10-20 DIAGNOSIS — I10 Essential (primary) hypertension: Secondary | ICD-10-CM | POA: Diagnosis not present

## 2023-10-20 DIAGNOSIS — E042 Nontoxic multinodular goiter: Secondary | ICD-10-CM | POA: Diagnosis not present

## 2023-10-20 DIAGNOSIS — D696 Thrombocytopenia, unspecified: Secondary | ICD-10-CM | POA: Diagnosis not present

## 2023-10-20 DIAGNOSIS — F411 Generalized anxiety disorder: Secondary | ICD-10-CM | POA: Diagnosis not present

## 2023-10-20 DIAGNOSIS — F5104 Psychophysiologic insomnia: Secondary | ICD-10-CM | POA: Diagnosis not present

## 2023-10-20 DIAGNOSIS — R7303 Prediabetes: Secondary | ICD-10-CM | POA: Diagnosis not present

## 2023-10-24 ENCOUNTER — Telehealth (INDEPENDENT_AMBULATORY_CARE_PROVIDER_SITE_OTHER): Payer: Self-pay | Admitting: Physician Assistant

## 2023-10-24 ENCOUNTER — Other Ambulatory Visit (INDEPENDENT_AMBULATORY_CARE_PROVIDER_SITE_OTHER): Payer: Self-pay | Admitting: Physician Assistant

## 2023-10-24 DIAGNOSIS — R7303 Prediabetes: Secondary | ICD-10-CM

## 2023-10-24 MED ORDER — TOPIRAMATE 25 MG PO TABS: 25.0000 mg | ORAL_TABLET | Freq: Every day | ORAL | 0 refills | Status: DC

## 2023-10-24 NOTE — Telephone Encounter (Signed)
 Pt called in stating she needs a refill of topiramate  50 mg and she only has enough for today and tomorrow. Provider has no availability to get an appt before next scheduled appt. Please follow up with patient.

## 2023-11-06 NOTE — Progress Notes (Unsigned)
 SUBJECTIVE: Discussed the use of AI scribe software for clinical note transcription with the patient, who gave verbal consent to proceed.  Chief Complaint: Obesity  Interim History: She is down 3 lbs since her last visit.  Down 5 lbs overall since resuming program 09/13/23 TBW loss of 2.5% since resuming program 09/13/23  Leah Cameron is here to discuss her progress with her obesity treatment plan. She is on the Category 2 Plan and states she is following her eating plan approximately 95 % of the time. She states she is exercising stationary bike 5 miles 4 times per week.  Discussed the use of AI scribe software for clinical note transcription with the patient, who gave verbal consent to proceed.  History of Present Illness Leah Cameron is a 73 year old female who presents for follow-up of her obesity treatment plan.  She has lost five pounds since her last visit and adheres to her nutrition plan 95% of the time. She has made significant dietary changes, including having her larger meal in the middle of the day. She wants to speed up the weight loss process and is currently taking Topamax  for cravings, which helps 'just a little'. She is taking two pills and tolerating it well.  Her medical history includes prediabetes, hypertension, hyperlipidemia, and vitamin D  deficiency. She has previously used metformin  and still has some available. She has not experienced any significant side effects from her current medications.  She has been exercising more, including riding a stationary bike, which resulted in some back pain. She uses Aspercreme, Salonpas patches, and Tiger Balm for relief, and finds that hot showers help. She is exploring chair exercises and other low-impact activities to strengthen her core and alleviate back pain.  Her social history includes being a homebody and having a gym membership at [employer], which she has not been utilizing. She has a supportive family structure,  including a husband, daughter, and grandchildren, and is involved in caregiving for her sister and mother-in-law.   OBJECTIVE: Visit Diagnoses: Problem List Items Addressed This Visit     Essential hypertension--hypokalemia, question of primary hyperaldosteronism   Prediabetes - Primary   Relevant Medications   metFORMIN  (GLUCOPHAGE ) 500 MG tablet   topiramate  (TOPAMAX ) 25 MG tablet   Class 1 obesity with serious comorbidity and body mass index (BMI) of 31.0 to 31.9 in adult   Relevant Medications   metFORMIN  (GLUCOPHAGE ) 500 MG tablet   Other Visit Diagnoses       Decreased renal function         BMI 30.0-30.9,adult Current BMI 30.7         1. Prediabetes with cravings (Primary) Last A1c was 6.4- She has had A1c of 6.5 x 1. Insulin  14.6- not at goal.   Medication(s): Topiramate  50 mg daily Polyphagia:No Lab Results  Component Value Date   HGBA1C 6.4 (H) 09/13/2023   HGBA1C 6.5 04/20/2023   HGBA1C 6.3 09/27/2022   HGBA1C 6.4 03/23/2022   HGBA1C 6.3 12/30/2021   Lab Results  Component Value Date   INSULIN  14.6 09/13/2023   INSULIN  11.7 11/09/2022    Plan: Continue and refill Topiramate  50 mg daily Restart metformin  500 mg once daily and monitor response Continue working on nutrition plan to decrease simple carbohydrates, increase lean proteins and exercise to promote weight loss, improve glycemic control and prevent progression to Type 2 diabetes.    2. Essential hypertension-- HX hypokalemia, question of primary hyperaldosteronism On benazepril  40 mg daily Spironolactone  25 mg daily.  Reports no SE.  Mildly decreased renal function is stable. BP Readings from Last 3 Encounters:  11/07/23 (!) 143/85  10/13/23 126/84  09/27/23 (!) 152/88   Lab Results  Component Value Date   NA 137 10/13/2023   CL 105 10/13/2023   K 3.9 10/13/2023   CO2 26 10/13/2023   BUN 25 (H) 10/13/2023   CREATININE 1.08 10/13/2023   GFR 51.46 (L) 10/13/2023   CALCIUM 9.9 10/13/2023    ALBUMIN 4.5 10/13/2023   GLUCOSE 94 10/13/2023   Plan: Continue benazepril  40 mg daily and Spironolactone  25 mg daily.  Watch renal function Continue to work on nutrition plan to promote weight loss and improve BP control.    3. Decreased renal function  Lab Results  Component Value Date   GFR 51.46 (L) 10/13/2023   GFR 58.02 (L) 04/20/2023   GFR 56.86 (L) 09/27/2022   Lab Results  Component Value Date   CREATININE 1.08 10/13/2023   BUN 25 (H) 10/13/2023   NA 137 10/13/2023   K 3.9 10/13/2023   CL 105 10/13/2023   CO2 26 10/13/2023    Plan: We discussed several lifestyle modifications today and she will continue to work on diet, exercise and weight loss efforts.  Increase water intake. Patient will avoid nephrotoxic medications such as NSAIDs.  Maximize control of pre-diabetes and hypertension.    6. Obesity, Beginning BMI of 31.5 Obesity Leah Cameron, a 73 year old female, is adhering to her nutrition plan 95% of the time, resulting in a 5-pound adipose mass loss. Her BMI is 30.7, with a target weight of 160 pounds. She engages in stationary biking and chair exercises for core strengthening. She tolerates topiramate  for cravings and is considering additional medication for weight loss. Gradual weight loss is preferred to preserve muscle mass. Metformin  is expected to reduce central adiposity and address insulin  resistance. - Continue current nutrition plan with 95% adherence. - Continue topiramate  50 mg once daily  - Restart metformin  500 mg once daily with the main meal. - Encourage core strengthening exercises, either seated or standing, to aid weight loss and improve back health.   General Health Maintenance Leah Cameron is encouraged to continue her exercise regimen and maintain her nutrition plan. She is exploring exercise options feasible given her homebody nature and back issues, focusing on slow, measured movements to prevent injury and improve strength. -  Encourage regular exercise, including chair exercises and slow, measured movements for strength training. - Continue to adhere to the nutrition plan with a focus on reducing adipose mass.  Follow-up Leah Cameron is scheduled for follow-up appointments to monitor progress and adjust her treatment plan as necessary. - Follow-up appointment scheduled for August 25th at 2 PM. - Schedule another follow-up appointment for September 22nd at 12 PM. Vitals Temp: 98.2 F (36.8 C) BP: (!) 143/85 Pulse Rate: 98 SpO2: 99 %   Anthropometric Measurements Height: 5' 7 (1.702 m) Weight: 196 lb (88.9 kg) BMI (Calculated): 30.69 Weight at Last Visit: 199 lb Weight Lost Since Last Visit: 3 lb Weight Gained Since Last Visit: 0 Starting Weight: 201 lb Total Weight Loss (lbs): 5 lb (2.268 kg)   Body Composition  Body Fat %: 41.2 % Fat Mass (lbs): 80.8 lbs Muscle Mass (lbs): 109.4 lbs Total Body Water (lbs): 68.4 lbs Visceral Fat Rating : 12   Other Clinical Data Fasting: No Labs: No Today's Visit #: 9 Starting Date: 11/09/22     ASSESSMENT AND PLAN:  Diet: Leah Cameron is currently in the action stage  of change. As such, her goal is to continue with weight loss efforts. She has agreed to Category 2 Plan.  Exercise: Leah Cameron has been instructed to work up to a goal of 150 minutes of combined cardio and strengthening exercise per week for weight loss and overall health benefits.   Behavior Modification:  We discussed the following Behavioral Modification Strategies today: increasing lean protein intake, decreasing simple carbohydrates, increasing vegetables, increase H2O intake, increase high fiber foods, no skipping meals, meal planning and cooking strategies, avoiding temptations, and planning for success. We discussed various medication options to help Leah Cameron with her weight loss efforts and we both agreed to add metformin  and continue topamax  for prediabetes with cravings and continue to work  on nutritional and behavioral strategies to promote weight loss.  .  Return in about 4 weeks (around 12/05/2023).Leah Cameron She was informed of the importance of frequent follow up visits to maximize her success with intensive lifestyle modifications for her multiple health conditions.  Attestation Statements:   Reviewed by clinician on day of visit: allergies, medications, problem list, medical history, surgical history, family history, social history, and previous encounter notes.   Time spent on visit including pre-visit chart review and post-visit care and charting was 37 minutes.    Leah Freid, PA-C

## 2023-11-07 ENCOUNTER — Encounter (INDEPENDENT_AMBULATORY_CARE_PROVIDER_SITE_OTHER): Payer: Self-pay | Admitting: Physician Assistant

## 2023-11-07 ENCOUNTER — Ambulatory Visit (INDEPENDENT_AMBULATORY_CARE_PROVIDER_SITE_OTHER): Admitting: Physician Assistant

## 2023-11-07 VITALS — BP 143/85 | HR 98 | Temp 98.2°F | Ht 67.0 in | Wt 196.0 lb

## 2023-11-07 DIAGNOSIS — I10 Essential (primary) hypertension: Secondary | ICD-10-CM | POA: Diagnosis not present

## 2023-11-07 DIAGNOSIS — E559 Vitamin D deficiency, unspecified: Secondary | ICD-10-CM

## 2023-11-07 DIAGNOSIS — E66811 Obesity, class 1: Secondary | ICD-10-CM | POA: Diagnosis not present

## 2023-11-07 DIAGNOSIS — R7303 Prediabetes: Secondary | ICD-10-CM

## 2023-11-07 DIAGNOSIS — N289 Disorder of kidney and ureter, unspecified: Secondary | ICD-10-CM | POA: Diagnosis not present

## 2023-11-07 DIAGNOSIS — Z6830 Body mass index (BMI) 30.0-30.9, adult: Secondary | ICD-10-CM | POA: Diagnosis not present

## 2023-11-07 DIAGNOSIS — E785 Hyperlipidemia, unspecified: Secondary | ICD-10-CM

## 2023-11-07 DIAGNOSIS — Z6831 Body mass index (BMI) 31.0-31.9, adult: Secondary | ICD-10-CM

## 2023-11-07 MED ORDER — METFORMIN HCL 500 MG PO TABS: 500.0000 mg | ORAL_TABLET | Freq: Every day | ORAL | 1 refills | Status: AC

## 2023-11-07 MED ORDER — TOPIRAMATE 25 MG PO TABS: 50.0000 mg | ORAL_TABLET | Freq: Every day | ORAL | 1 refills | Status: DC

## 2023-11-08 DIAGNOSIS — L810 Postinflammatory hyperpigmentation: Secondary | ICD-10-CM | POA: Diagnosis not present

## 2023-11-08 DIAGNOSIS — L918 Other hypertrophic disorders of the skin: Secondary | ICD-10-CM | POA: Diagnosis not present

## 2023-11-08 DIAGNOSIS — D229 Melanocytic nevi, unspecified: Secondary | ICD-10-CM | POA: Diagnosis not present

## 2023-11-08 DIAGNOSIS — W57XXXA Bitten or stung by nonvenomous insect and other nonvenomous arthropods, initial encounter: Secondary | ICD-10-CM | POA: Diagnosis not present

## 2023-11-08 DIAGNOSIS — L821 Other seborrheic keratosis: Secondary | ICD-10-CM | POA: Diagnosis not present

## 2023-11-08 DIAGNOSIS — S90569A Insect bite (nonvenomous), unspecified ankle, initial encounter: Secondary | ICD-10-CM | POA: Diagnosis not present

## 2023-11-08 DIAGNOSIS — L818 Other specified disorders of pigmentation: Secondary | ICD-10-CM | POA: Diagnosis not present

## 2023-11-09 DIAGNOSIS — H2513 Age-related nuclear cataract, bilateral: Secondary | ICD-10-CM | POA: Diagnosis not present

## 2023-11-09 DIAGNOSIS — H524 Presbyopia: Secondary | ICD-10-CM | POA: Diagnosis not present

## 2023-11-09 DIAGNOSIS — H43821 Vitreomacular adhesion, right eye: Secondary | ICD-10-CM | POA: Diagnosis not present

## 2023-11-09 DIAGNOSIS — H5213 Myopia, bilateral: Secondary | ICD-10-CM | POA: Diagnosis not present

## 2023-11-09 DIAGNOSIS — H40023 Open angle with borderline findings, high risk, bilateral: Secondary | ICD-10-CM | POA: Diagnosis not present

## 2023-11-09 DIAGNOSIS — H52223 Regular astigmatism, bilateral: Secondary | ICD-10-CM | POA: Diagnosis not present

## 2023-11-23 ENCOUNTER — Other Ambulatory Visit: Payer: Self-pay | Admitting: Internal Medicine

## 2023-11-23 NOTE — Telephone Encounter (Signed)
 Rx for amlodipine  refused. Pt establish w/ new PCP on 10/20/23, care team updated.

## 2023-12-04 NOTE — Progress Notes (Unsigned)
 SUBJECTIVE: Discussed the use of AI scribe software for clinical note transcription with the patient, who gave verbal consent to proceed.  Chief Complaint: Obesity  Interim History: She is down 3 lbs since her last visit.  Down 5 lbs overall since resuming program 09/13/23 TBW loss of 2.5% since resuming program 09/13/23  Leah Cameron is here to discuss her progress with her obesity treatment plan. She is on the Category 2 Plan and states she is following her eating plan approximately 95 % of the time. She states she is exercising stationary bike 5 miles 4 times per week.  Discussed the use of AI scribe software for clinical note transcription with the patient, who gave verbal consent to proceed.  History of Present Illness Leah Cameron is a 73 year old female who presents for follow-up of her obesity treatment plan.  She has lost five pounds since her last visit and adheres to her nutrition plan 95% of the time. She has made significant dietary changes, including having her larger meal in the middle of the day. She wants to speed up the weight loss process and is currently taking Topamax  for cravings, which helps 'just a little'. She is taking two pills and tolerating it well.  Her medical history includes prediabetes, hypertension, hyperlipidemia, and vitamin D  deficiency. She has previously used metformin  and still has some available. She has not experienced any significant side effects from her current medications.  She has been exercising more, including riding a stationary bike, which resulted in some back pain. She uses Aspercreme, Salonpas patches, and Tiger Balm for relief, and finds that hot showers help. She is exploring chair exercises and other low-impact activities to strengthen her core and alleviate back pain.  Her social history includes being a homebody and having a gym membership at [employer], which she has not been utilizing. She has a supportive family structure,  including a husband, daughter, and grandchildren, and is involved in caregiving for her sister and mother-in-law.   OBJECTIVE: Visit Diagnoses: Problem List Items Addressed This Visit     Essential hypertension--hypokalemia, question of primary hyperaldosteronism   Prediabetes - Primary   Relevant Medications   metFORMIN  (GLUCOPHAGE ) 500 MG tablet   topiramate  (TOPAMAX ) 25 MG tablet   Class 1 obesity with serious comorbidity and body mass index (BMI) of 31.0 to 31.9 in adult   Relevant Medications   metFORMIN  (GLUCOPHAGE ) 500 MG tablet   Other Visit Diagnoses       Decreased renal function         BMI 30.0-30.9,adult Current BMI 30.7         1. Prediabetes with cravings (Primary) Last A1c was 6.4- She has had A1c of 6.5 x 1. Insulin  14.6- not at goal.   Medication(s): Topiramate  50 mg daily Polyphagia:No Lab Results  Component Value Date   HGBA1C 6.4 (H) 09/13/2023   HGBA1C 6.5 04/20/2023   HGBA1C 6.3 09/27/2022   HGBA1C 6.4 03/23/2022   HGBA1C 6.3 12/30/2021   Lab Results  Component Value Date   INSULIN  14.6 09/13/2023   INSULIN  11.7 11/09/2022    Plan: Continue and refill Topiramate  50 mg daily Restart metformin  500 mg once daily and monitor response Continue working on nutrition plan to decrease simple carbohydrates, increase lean proteins and exercise to promote weight loss, improve glycemic control and prevent progression to Type 2 diabetes.    2. Essential hypertension-- HX hypokalemia, question of primary hyperaldosteronism On benazepril  40 mg daily Spironolactone  25 mg daily.  Reports no SE.  Mildly decreased renal function is stable. BP Readings from Last 3 Encounters:  11/07/23 (!) 143/85  10/13/23 126/84  09/27/23 (!) 152/88   Lab Results  Component Value Date   NA 137 10/13/2023   CL 105 10/13/2023   K 3.9 10/13/2023   CO2 26 10/13/2023   BUN 25 (H) 10/13/2023   CREATININE 1.08 10/13/2023   GFR 51.46 (L) 10/13/2023   CALCIUM 9.9 10/13/2023    ALBUMIN 4.5 10/13/2023   GLUCOSE 94 10/13/2023   Plan: Continue benazepril  40 mg daily and Spironolactone  25 mg daily.  Watch renal function Continue to work on nutrition plan to promote weight loss and improve BP control.    3. Decreased renal function  Lab Results  Component Value Date   GFR 51.46 (L) 10/13/2023   GFR 58.02 (L) 04/20/2023   GFR 56.86 (L) 09/27/2022   Lab Results  Component Value Date   CREATININE 1.08 10/13/2023   BUN 25 (H) 10/13/2023   NA 137 10/13/2023   K 3.9 10/13/2023   CL 105 10/13/2023   CO2 26 10/13/2023    Plan: We discussed several lifestyle modifications today and she will continue to work on diet, exercise and weight loss efforts.  Increase water intake. Patient will avoid nephrotoxic medications such as NSAIDs.  Maximize control of pre-diabetes and hypertension.    6. Obesity, Beginning BMI of 31.5 Obesity Leah Cameron, a 73 year old female, is adhering to her nutrition plan 95% of the time, resulting in a 5-pound adipose mass loss. Her BMI is 30.7, with a target weight of 160 pounds. She engages in stationary biking and chair exercises for core strengthening. She tolerates topiramate  for cravings and is considering additional medication for weight loss. Gradual weight loss is preferred to preserve muscle mass. Metformin  is expected to reduce central adiposity and address insulin  resistance. - Continue current nutrition plan with 95% adherence. - Continue topiramate  50 mg once daily  - Restart metformin  500 mg once daily with the main meal. - Encourage core strengthening exercises, either seated or standing, to aid weight loss and improve back health.   General Health Maintenance Leah Cameron is encouraged to continue her exercise regimen and maintain her nutrition plan. She is exploring exercise options feasible given her homebody nature and back issues, focusing on slow, measured movements to prevent injury and improve strength. -  Encourage regular exercise, including chair exercises and slow, measured movements for strength training. - Continue to adhere to the nutrition plan with a focus on reducing adipose mass.  Follow-up Leah Cameron is scheduled for follow-up appointments to monitor progress and adjust her treatment plan as necessary. - Follow-up appointment scheduled for August 25th at 2 PM. - Schedule another follow-up appointment for September 22nd at 12 PM. Vitals Temp: 98.2 F (36.8 C) BP: (!) 143/85 Pulse Rate: 98 SpO2: 99 %   Anthropometric Measurements Height: 5' 7 (1.702 m) Weight: 196 lb (88.9 kg) BMI (Calculated): 30.69 Weight at Last Visit: 199 lb Weight Lost Since Last Visit: 3 lb Weight Gained Since Last Visit: 0 Starting Weight: 201 lb Total Weight Loss (lbs): 5 lb (2.268 kg)   Body Composition  Body Fat %: 41.2 % Fat Mass (lbs): 80.8 lbs Muscle Mass (lbs): 109.4 lbs Total Body Water (lbs): 68.4 lbs Visceral Fat Rating : 12   Other Clinical Data Fasting: No Labs: No Today's Visit #: 9 Starting Date: 11/09/22     ASSESSMENT AND PLAN:  Diet: Magdalina is currently in the action stage  of change. As such, her goal is to continue with weight loss efforts. She has agreed to Category 2 Plan.  Exercise: Lorena has been instructed to work up to a goal of 150 minutes of combined cardio and strengthening exercise per week for weight loss and overall health benefits.   Behavior Modification:  We discussed the following Behavioral Modification Strategies today: increasing lean protein intake, decreasing simple carbohydrates, increasing vegetables, increase H2O intake, increase high fiber foods, no skipping meals, meal planning and cooking strategies, avoiding temptations, and planning for success. We discussed various medication options to help Jennafer with her weight loss efforts and we both agreed to add metformin  and continue topamax  for prediabetes with cravings and continue to work  on nutritional and behavioral strategies to promote weight loss.  .  Return in about 4 weeks (around 12/05/2023).SABRA She was informed of the importance of frequent follow up visits to maximize her success with intensive lifestyle modifications for her multiple health conditions.  Attestation Statements:   Reviewed by clinician on day of visit: allergies, medications, problem list, medical history, surgical history, family history, social history, and previous encounter notes.   Time spent on visit including pre-visit chart review and post-visit care and charting was 37 minutes.    Leah Freid, PA-C

## 2023-12-05 ENCOUNTER — Ambulatory Visit (INDEPENDENT_AMBULATORY_CARE_PROVIDER_SITE_OTHER): Admitting: Physician Assistant

## 2023-12-05 ENCOUNTER — Other Ambulatory Visit: Payer: Self-pay | Admitting: Internal Medicine

## 2023-12-05 ENCOUNTER — Encounter (INDEPENDENT_AMBULATORY_CARE_PROVIDER_SITE_OTHER): Payer: Self-pay | Admitting: Physician Assistant

## 2023-12-05 VITALS — BP 130/82 | HR 80 | Temp 98.2°F | Ht 67.0 in | Wt 195.0 lb

## 2023-12-05 DIAGNOSIS — E559 Vitamin D deficiency, unspecified: Secondary | ICD-10-CM

## 2023-12-05 DIAGNOSIS — M545 Low back pain, unspecified: Secondary | ICD-10-CM

## 2023-12-05 DIAGNOSIS — R7303 Prediabetes: Secondary | ICD-10-CM | POA: Diagnosis not present

## 2023-12-05 DIAGNOSIS — E66811 Obesity, class 1: Secondary | ICD-10-CM | POA: Diagnosis not present

## 2023-12-05 DIAGNOSIS — Z6830 Body mass index (BMI) 30.0-30.9, adult: Secondary | ICD-10-CM | POA: Diagnosis not present

## 2023-12-05 DIAGNOSIS — I129 Hypertensive chronic kidney disease with stage 1 through stage 4 chronic kidney disease, or unspecified chronic kidney disease: Secondary | ICD-10-CM | POA: Diagnosis not present

## 2023-12-05 DIAGNOSIS — I10 Essential (primary) hypertension: Secondary | ICD-10-CM

## 2023-12-05 DIAGNOSIS — E785 Hyperlipidemia, unspecified: Secondary | ICD-10-CM

## 2023-12-05 DIAGNOSIS — E876 Hypokalemia: Secondary | ICD-10-CM | POA: Diagnosis not present

## 2023-12-05 DIAGNOSIS — N189 Chronic kidney disease, unspecified: Secondary | ICD-10-CM

## 2023-12-05 DIAGNOSIS — N289 Disorder of kidney and ureter, unspecified: Secondary | ICD-10-CM

## 2023-12-05 MED ORDER — TIRZEPATIDE-WEIGHT MANAGEMENT 2.5 MG/0.5ML ~~LOC~~ SOLN: 2.5000 mg | SUBCUTANEOUS | 0 refills | Status: DC

## 2023-12-23 ENCOUNTER — Other Ambulatory Visit: Payer: Self-pay | Admitting: Internal Medicine

## 2023-12-26 ENCOUNTER — Other Ambulatory Visit (INDEPENDENT_AMBULATORY_CARE_PROVIDER_SITE_OTHER): Payer: Self-pay | Admitting: Physician Assistant

## 2023-12-26 DIAGNOSIS — E785 Hyperlipidemia, unspecified: Secondary | ICD-10-CM

## 2023-12-26 DIAGNOSIS — R7303 Prediabetes: Secondary | ICD-10-CM

## 2023-12-26 DIAGNOSIS — N289 Disorder of kidney and ureter, unspecified: Secondary | ICD-10-CM

## 2023-12-26 DIAGNOSIS — E66811 Obesity, class 1: Secondary | ICD-10-CM

## 2023-12-28 ENCOUNTER — Other Ambulatory Visit (INDEPENDENT_AMBULATORY_CARE_PROVIDER_SITE_OTHER): Payer: Self-pay | Admitting: Physician Assistant

## 2023-12-28 DIAGNOSIS — R7303 Prediabetes: Secondary | ICD-10-CM

## 2023-12-28 DIAGNOSIS — Z6831 Body mass index (BMI) 31.0-31.9, adult: Secondary | ICD-10-CM

## 2023-12-28 DIAGNOSIS — N289 Disorder of kidney and ureter, unspecified: Secondary | ICD-10-CM

## 2023-12-28 DIAGNOSIS — E785 Hyperlipidemia, unspecified: Secondary | ICD-10-CM

## 2024-01-01 NOTE — Progress Notes (Unsigned)
 SUBJECTIVE: Discussed the use of AI scribe software for clinical note transcription with the patient, who gave verbal consent to proceed.  Chief Complaint: Obesity  Interim History: She is down 5 lbs since her last visit Down 11 lbs overall TBW loss of 5.5%  Leah Cameron is here to discuss her progress with her obesity treatment plan. She is on the Category 2 Plan and states she is following her eating plan approximately 100 % of the time. She states she is exercising walking 30 minutes 2 times per week.  Leah Cameron is a 73 year old female who presents for follow-up of her obesity treatment plan.  She is on a category two obesity treatment plan and has been fully compliant, resulting in a weight loss of five pounds since her last visit. She engages in physical activity by walking for 30 minutes twice a week. Her current medications include Zepbound  2.5 mg weekly, topiramate  50 mg daily, and metformin  500 mg daily. Previously, she experienced a burning sensation in her stomach, which resolved after decreasing the metformin  to once a day. No significant side effects from her current regimen.  She is also on amlodipine  5 mg daily, benazepril  40 mg daily, and spironolactone  25 mg daily for hypertension management.  She has a history of anemia, but her iron levels, transferrin, ferritin, and iron saturation are all within normal limits. She has a history of benign but precancerous polyps found during a colonoscopy in 2024. We discussed contacting her PCP for follow up concerning her most recent CBC.   She has been taking care of many family members, including visiting her sister who is in hospice care. For depression and anxiety, she takes Lexapro  10 mg daily. Her husband has recently become interested in her treatment plan due to his own weight concerns. She has been receiving assistance with her medication injections from her niece, who is a Engineer, civil (consulting), but plans to start administering them  herself.  OBJECTIVE: Visit Diagnoses: Problem List Items Addressed This Visit     Essential hypertension--hypokalemia, question of primary hyperaldosteronism   Anxiety state   Prediabetes - Primary   Relevant Medications   tirzepatide  (ZEPBOUND ) 2.5 MG/0.5ML injection vial   topiramate  (TOPAMAX ) 25 MG tablet   Class 1 obesity with serious comorbidity and body mass index (BMI) of 31.0 to 31.9 in adult   Relevant Medications   tirzepatide  (ZEPBOUND ) 2.5 MG/0.5ML injection vial   Other Visit Diagnoses       Hyperlipidemia, unspecified hyperlipidemia type       Relevant Medications   tirzepatide  (ZEPBOUND ) 2.5 MG/0.5ML injection vial     Low hemoglobin         BMI 29.0-29.9,adult Current BMI 29.8         Obesity Obesity management is ongoing with a category two plan.  She has lost five pounds since the last visit and is currently on a regimen of Zepbound  2.5 mg weekly, topiramate  50 mg daily, and metformin  500 mg daily.  She is also engaging in physical activity by walking 30 minutes twice a week.  The current weight is 190 lbs with a BMI under 30. The goal is to reduce adipose percentage to 35% or less, currently at 40.2%.  Visceral adipose rating has improved from 12 to 11.   No significant side effects from Zepbound . Denies mass in neck, dysphagia, dyspepsia, persistent hoarseness, abdominal pain, or N/V/Constipation or diarrhea. Has annual eye exam. Mood is stable.  The plan is to maintain the current  dose as she has responded well with weight loss since starting the Zepbound  2.5 mg weekly x 2 doses thus far.   Discussed the potential for maintaining weight loss with reduced dosing frequency or dose size in the future. - Continue/refill Zepbound  2.5 mg weekly  - Continue topiramate  50 mg daily - Continue metformin  500 mg daily - Monitor weight and adipose percentage - Plan to repeat labs in 2 months to assess kidney function, liver function, A1c, and insulin  levels Meds ordered  this encounter  Medications   tirzepatide  (ZEPBOUND ) 2.5 MG/0.5ML injection vial    Sig: Inject 2.5 mg into the skin once a week.    Dispense:  2 mL    Refill:  0   topiramate  (TOPAMAX ) 25 MG tablet    Sig: Take 2 tablets (50 mg total) by mouth daily.    Dispense:  60 tablet    Refill:  1    Essential hypertension Hypertension is managed with amlodipine  5 mg daily, benazepril  40 mg daily, and spironolactone  25 mg daily. BP Readings from Last 3 Encounters:  01/02/24 (!) 144/83  12/05/23 130/82  11/07/23 (!) 143/85   Lab Results  Component Value Date   NA 137 10/13/2023   CL 105 10/13/2023   K 3.9 10/13/2023   CO2 26 10/13/2023   BUN 25 (H) 10/13/2023   CREATININE 1.08 10/13/2023   GFR 51.46 (L) 10/13/2023   CALCIUM 9.9 10/13/2023   ALBUMIN 4.5 10/13/2023   GLUCOSE 94 10/13/2023   Monitor for BP improvements on GLP-1/GIP medication and with further weight loss.  -Continue to work on nutrition plan to promote weight loss and improve BP control.  - Continue amlodipine  5 mg daily - Continue benazepril  40 mg daily - Continue spironolactone  25 mg daily  Hyperlipidemia The goal is to improve LDL cholesterol levels. Current medication regimen and lifestyle changes are expected to contribute to improvements. Last lipids Lab Results  Component Value Date   CHOL 174 09/13/2023   HDL 45 09/13/2023   LDLCALC 111 (H) 09/13/2023   TRIG 95 09/13/2023   CHOLHDL 4 04/01/2020   Continue to work on nutrition plan -decreasing simple carbohydrates, increasing lean proteins, decreasing saturated fats and cholesterol , avoiding trans fats and exercise as able to promote weight loss, improve lipids and decrease cardiovascular risks. - Monitor cholesterol levels in future lab assessments in 2 months.   Prediabetes with cravings Prediabetes is being managed with metformin  500 mg daily and topiramate  50 mg nightly. The current treatment plan is expected to improve A1c and insulin  levels over  time. Lab Results  Component Value Date   HGBA1C 6.4 (H) 09/13/2023   HGBA1C 6.5 04/20/2023   HGBA1C 6.3 09/27/2022   Lab Results  Component Value Date   LDLCALC 111 (H) 09/13/2023   CREATININE 1.08 10/13/2023   INSULIN   Date Value Ref Range Status  09/13/2023 14.6 2.6 - 24.9 uIU/mL Final  ]Continue working on nutrition plan to decrease simple carbohydrates, increase lean proteins and exercise to promote weight loss, improve glycemic control and prevent progression to Type 2 diabetes.  - Continue/refill Zepbound  2.5 mg weekly -Continue/refill topiramate  50 mg nightly - Continue metformin  500 mg daily- no refills needed currently - Monitor A1c and insulin  levels in future lab assessments in 2 months Meds ordered this encounter  Medications   tirzepatide  (ZEPBOUND ) 2.5 MG/0.5ML injection vial    Sig: Inject 2.5 mg into the skin once a week.    Dispense:  2 mL  Refill:  0   topiramate  (TOPAMAX ) 25 MG tablet    Sig: Take 2 tablets (50 mg total) by mouth daily.    Dispense:  60 tablet    Refill:  1    Anemia, unspecified Anemia is noted with low hemoglobin levels and slightly low platelets. Previous colonoscopy in 2024 revealed precancerous polyps. Differential diagnosis includes potential gastrointestinal bleeding. Iron levels~normal, suggesting possible blood loss. Further evaluation with primary care is recommended. - Discuss with primary care physician regarding potential need for earlier colonoscopy and upper endoscopy - Monitor hemoglobin levels- repeat if not done by PCP over the next 2 months.    Depression and anxiety/stress Depression and anxiety are managed with Lexapro  10 mg daily. - Continue Lexapro  10 mg daily  Vitals Temp: 97.7 F (36.5 C) BP: (!) 144/83 Pulse Rate: 64 SpO2: 100 %   Anthropometric Measurements Height: 5' 7 (1.702 m) Weight: 190 lb (86.2 kg) BMI (Calculated): 29.75 Weight at Last Visit: 195 lb Weight Lost Since Last Visit: 5  lb Weight Gained Since Last Visit: 0 Starting Weight: 201 lb Total Weight Loss (lbs): 11 lb (4.99 kg) Waist Measurement : 40.2 inches   Body Composition  Body Fat %: 40.2 % Fat Mass (lbs): 76.4 lbs Muscle Mass (lbs): 108 lbs Total Body Water (lbs): 68 lbs Visceral Fat Rating : 11   Other Clinical Data Fasting: No Labs: No Today's Visit #: 11 Starting Date: 11/09/22     ASSESSMENT AND PLAN:  Diet: Mileydi is currently in the action stage of change. As such, her goal is to continue with weight loss efforts. She has agreed to Category 2 Plan.  Exercise: Cashlyn has been instructed to work up to a goal of 150 minutes of combined cardio and strengthening exercise per week for weight loss and overall health benefits.   Behavior Modification:  We discussed the following Behavioral Modification Strategies today: increasing lean protein intake, decreasing simple carbohydrates, increasing vegetables, increase H2O intake, increase high fiber foods, no skipping meals, meal planning and cooking strategies, emotional eating strategies , avoiding temptations, and planning for success. We discussed various medication options to help Maddyn with her weight loss efforts and we both agreed to Continue current treatment plan.  Return in about 3 weeks (around 01/23/2024).SABRA She was informed of the importance of frequent follow up visits to maximize her success with intensive lifestyle modifications for her multiple health conditions.  Attestation Statements:   Reviewed by clinician on day of visit: allergies, medications, problem list, medical history, surgical history, family history, social history, and previous encounter notes.   Time spent on visit including pre-visit chart review and post-visit care and charting was 40 minutes.    Jehad Bisono, PA-C

## 2024-01-02 ENCOUNTER — Ambulatory Visit (INDEPENDENT_AMBULATORY_CARE_PROVIDER_SITE_OTHER): Admitting: Physician Assistant

## 2024-01-02 ENCOUNTER — Encounter (INDEPENDENT_AMBULATORY_CARE_PROVIDER_SITE_OTHER): Payer: Self-pay | Admitting: Physician Assistant

## 2024-01-02 VITALS — BP 144/83 | HR 64 | Temp 97.7°F | Ht 67.0 in | Wt 190.0 lb

## 2024-01-02 DIAGNOSIS — D649 Anemia, unspecified: Secondary | ICD-10-CM

## 2024-01-02 DIAGNOSIS — Z6829 Body mass index (BMI) 29.0-29.9, adult: Secondary | ICD-10-CM | POA: Diagnosis not present

## 2024-01-02 DIAGNOSIS — R7303 Prediabetes: Secondary | ICD-10-CM

## 2024-01-02 DIAGNOSIS — E785 Hyperlipidemia, unspecified: Secondary | ICD-10-CM | POA: Diagnosis not present

## 2024-01-02 DIAGNOSIS — E66811 Obesity, class 1: Secondary | ICD-10-CM | POA: Diagnosis not present

## 2024-01-02 DIAGNOSIS — E559 Vitamin D deficiency, unspecified: Secondary | ICD-10-CM

## 2024-01-02 DIAGNOSIS — I10 Essential (primary) hypertension: Secondary | ICD-10-CM | POA: Diagnosis not present

## 2024-01-02 DIAGNOSIS — E876 Hypokalemia: Secondary | ICD-10-CM | POA: Diagnosis not present

## 2024-01-02 DIAGNOSIS — F411 Generalized anxiety disorder: Secondary | ICD-10-CM

## 2024-01-02 DIAGNOSIS — N289 Disorder of kidney and ureter, unspecified: Secondary | ICD-10-CM

## 2024-01-02 MED ORDER — TOPIRAMATE 25 MG PO TABS: 50.0000 mg | ORAL_TABLET | Freq: Every day | ORAL | 1 refills | Status: DC

## 2024-01-02 MED ORDER — TIRZEPATIDE-WEIGHT MANAGEMENT 2.5 MG/0.5ML ~~LOC~~ SOLN: 2.5000 mg | SUBCUTANEOUS | 0 refills | Status: DC

## 2024-01-06 DIAGNOSIS — D696 Thrombocytopenia, unspecified: Secondary | ICD-10-CM | POA: Diagnosis not present

## 2024-01-06 DIAGNOSIS — R7303 Prediabetes: Secondary | ICD-10-CM | POA: Diagnosis not present

## 2024-01-06 DIAGNOSIS — I10 Essential (primary) hypertension: Secondary | ICD-10-CM | POA: Diagnosis not present

## 2024-01-06 DIAGNOSIS — Z13220 Encounter for screening for lipoid disorders: Secondary | ICD-10-CM | POA: Diagnosis not present

## 2024-01-12 DIAGNOSIS — N1831 Chronic kidney disease, stage 3a: Secondary | ICD-10-CM | POA: Diagnosis not present

## 2024-01-20 ENCOUNTER — Other Ambulatory Visit: Payer: Self-pay | Admitting: Family Medicine

## 2024-01-20 DIAGNOSIS — N63 Unspecified lump in unspecified breast: Secondary | ICD-10-CM

## 2024-01-23 ENCOUNTER — Encounter (INDEPENDENT_AMBULATORY_CARE_PROVIDER_SITE_OTHER): Payer: Self-pay | Admitting: Physician Assistant

## 2024-01-23 ENCOUNTER — Ambulatory Visit (INDEPENDENT_AMBULATORY_CARE_PROVIDER_SITE_OTHER): Admitting: Physician Assistant

## 2024-01-23 VITALS — BP 128/84 | HR 78 | Temp 98.2°F | Ht 67.0 in | Wt 187.0 lb

## 2024-01-23 DIAGNOSIS — N1831 Chronic kidney disease, stage 3a: Secondary | ICD-10-CM

## 2024-01-23 DIAGNOSIS — R7303 Prediabetes: Secondary | ICD-10-CM

## 2024-01-23 DIAGNOSIS — I129 Hypertensive chronic kidney disease with stage 1 through stage 4 chronic kidney disease, or unspecified chronic kidney disease: Secondary | ICD-10-CM

## 2024-01-23 DIAGNOSIS — E785 Hyperlipidemia, unspecified: Secondary | ICD-10-CM | POA: Diagnosis not present

## 2024-01-23 DIAGNOSIS — Z6831 Body mass index (BMI) 31.0-31.9, adult: Secondary | ICD-10-CM

## 2024-01-23 DIAGNOSIS — Z6829 Body mass index (BMI) 29.0-29.9, adult: Secondary | ICD-10-CM

## 2024-01-23 DIAGNOSIS — I10 Essential (primary) hypertension: Secondary | ICD-10-CM

## 2024-01-23 DIAGNOSIS — E66811 Obesity, class 1: Secondary | ICD-10-CM

## 2024-01-23 MED ORDER — TIRZEPATIDE-WEIGHT MANAGEMENT 2.5 MG/0.5ML ~~LOC~~ SOLN: 2.5000 mg | SUBCUTANEOUS | 0 refills | Status: DC

## 2024-01-23 NOTE — Progress Notes (Signed)
 SUBJECTIVE: Discussed the use of AI scribe software for clinical note transcription with the patient, who gave verbal consent to proceed.  Chief Complaint: Obesity  Interim History: She is down 3 lbs since her last visit.  Down 14 lbs overall TBW loss of ~ 7%  Leah Cameron is here to discuss her progress with her obesity treatment plan. She is on the Category 2 Plan and states she is following her eating plan approximately 80 % of the time. She states she is not exercising .  Leah Cameron is a 73 year old female with obesity who presents for follow-up of her obesity treatment plan.  She is on Zepbound  2.5 mg weekly for obesity and has lost 14 pounds, with 3 pounds lost recently. She self-administers the medication without issues. No excessive cravings since starting Zepbound , but she wants more food at night. She drinks a lot of water and does not believe fluid intake is an issue.  She has prediabetes and takes metformin  500 mg daily. She needs a prescription refill for metformin . She is concerned about her kidney function and plans to follow up with her primary care provider regarding her medications and kidney function monitoring.  She has hyperlipidemia and hypertension. Her hypertension medications include benazepril  40 mg daily, amlodipine  5 mg daily, and spironolactone  25 mg daily. She was switched from hydrochlorothiazide  to spironolactone  due to low potassium levels in the past.  She reports that her recent labs showed a GFR of 49, at her visit with her PCP at [facility], which is lower than her previous values.  She has a history of frequent Aleve use at night, which she suspects may have contributed to her kidney issues but we discussed holding metformin  and topiramate  for the next month and rechecking kidney function next visit.  She has a family history of strokes, with her mother, father, and two sisters having passed from strokes.  She describes herself as a night  owl, often going to bed late and waking up around 8 AM. She sometimes has a protein drink late at night. She is not engaging in regular exercise due to home renovations but remains active throughout the day. OBJECTIVE: Visit Diagnoses: Problem List Items Addressed This Visit     Essential hypertension--hypokalemia, question of primary hyperaldosteronism   Prediabetes - Primary   Relevant Medications   tirzepatide  (ZEPBOUND ) 2.5 MG/0.5ML injection vial   Class 1 obesity with serious comorbidity and body mass index (BMI) of 31.0 to 31.9 in adult   Relevant Medications   tirzepatide  (ZEPBOUND ) 2.5 MG/0.5ML injection vial   Other Visit Diagnoses       Hyperlipidemia, unspecified hyperlipidemia type       Relevant Medications   tirzepatide  (ZEPBOUND ) 2.5 MG/0.5ML injection vial     Chronic kidney disease, stage 3a (HCC)         BMI 29.0-29.9,adult Current BMI 29.4          Chronic kidney disease, stage 3a Recent labs indicate a decline in kidney function with a GFR of 49, down from previous readings in the fifties and generally greater than sixty. This decline occurred prior to starting Zepbound . Concerns about potential nephrotoxic effects of medications, including Zepbound , metformin , and topiramate . - Hold metformin  - Hold topiramate  - Ensure adequate hydration - Recheck kidney function in one month if not done by primary care She has discontinued NSAID - Aleve and will use Tylenol if needs something for pain.   Obesity, class 1 She is on Zepbound   2.5 mg weekly for medical weight loss and has lost 14 pounds overall. She reports no significant side effects and is responding well to the medication. Denies mass in neck, dysphagia, dyspepsia, persistent hoarseness, abdominal pain, or N/V/Constipation or diarrhea. Has annual eye exam. Mood is stable.   - Continue/refill Zepbound  2.5 mg weekly Continue current nutrition plan and discussed trying to incorporate some walking videos for  15-20 minutes in the evenings after dinner.  - Monitor weight and adjust medication if necessary Meds ordered this encounter  Medications   tirzepatide  (ZEPBOUND ) 2.5 MG/0.5ML injection vial    Sig: Inject 2.5 mg into the skin once a week.    Dispense:  2 mL    Refill:  0    Essential hypertension Blood pressure is reportedly lower, possibly due to weight loss and current medication regimen. She is on benazepril , amlodipine , and spironolactone . Discussion about the appropriateness of current medications in light of kidney function and blood pressure control. - Continue benazepril  40 mg daily - Continue amlodipine  5 mg daily - Continue spironolactone  25 mg daily Recheck renal panel next visit.   Prediabetes Currently managed with metformin , which is being held to assess impact on kidney function. Discussion about the potential impact of statin therapy on diabetes risk, but benefits may outweigh risks given family history of cardiovascular events. Started topiramate  and titrated to 50 mg nightly for cravings and the patient did feel it was somewhat helpful, but with decline in renal function, will hold topiramate  and monitor renal function and cravings and follow up on progress and labs next month.  - Hold topiramate  - Hold metformin  - Consider statin therapy if lipid profile does not improve after three months on Zepbound  - Recheck renal panel next month and if improved, could resume and monitor closely.   Hyperlipidemia LDL is not at goal. Labs done at [facility] over the past month as well.  Medication(s): Not on Statin.  Cardiovascular risk factors: advanced age (older than 31 for men, 65 for women), dyslipidemia, family history of premature cardiovascular disease, hypertension, and sedentary lifestyle  Lab Results  Component Value Date   CHOL 174 09/13/2023   HDL 45 09/13/2023   LDLCALC 111 (H) 09/13/2023   TRIG 95 09/13/2023   CHOLHDL 4 04/01/2020   CHOLHDL 4 06/13/2018    CHOLHDL 4 11/16/2017   Lab Results  Component Value Date   ALT 14 10/13/2023   AST 15 10/13/2023   ALKPHOS 66 10/13/2023   BILITOT 0.4 10/13/2023   The 10-year ASCVD risk score (Arnett DK, et al., 2019) is: 8.4%   Values used to calculate the score:     Age: 89 years     Clincally relevant sex: Female     Is Non-Hispanic African American: Yes     Diabetic: No     Tobacco smoker: No     Systolic Blood Pressure: 128 mmHg     Is BP treated: Yes     HDL Cholesterol: 38 mg/dL     Total Cholesterol: 131 mg/dL  Lipid panel shows low HDL cholesterol, elevated LDL. Discussion about the potential benefits of statin therapy given family history of cardiovascular events. Encouragement to continue lifestyle modifications, including exercise, to improve HDL levels.   Plan: Consider statin therapy if lipid panel not improved with current nutrition plan and Zepbound  over next 3-4 months. .  Very strong family history of CVA including sisters.  Continue to work on nutrition plan -decreasing simple carbohydrates, increasing lean proteins, decreasing  saturated fats and cholesterol , avoiding trans fats and exercise as able to promote weight loss, improve lipids and decrease cardiovascular risks. - Consider statin therapy if lipid profile does not improve after three months on Zepbound  - Encourage regular exercise, such as walking videos or evening walks     Vitals Temp: 98.2 F (36.8 C) BP: 128/84 Pulse Rate: 78   Anthropometric Measurements Height: 5' 7 (1.702 m) Weight: 187 lb (84.8 kg) BMI (Calculated): 29.28 Weight at Last Visit: 150 lb Weight Lost Since Last Visit: 3 lb Weight Gained Since Last Visit: 0 Starting Weight: 201 lb Total Weight Loss (lbs): 14 lb (6.35 kg)   Body Composition  Body Fat %: 40.7 % Fat Mass (lbs): 76.2 lbs Muscle Mass (lbs): 105.6 lbs Total Body Water (lbs): 66.2 lbs Visceral Fat Rating : 11   Other Clinical Data Fasting: No Labs: No Today's  Visit #: 12 Starting Date: 11/09/22     ASSESSMENT AND PLAN:  Diet: Leah Cameron is currently in the action stage of change. As such, her goal is to continue with weight loss efforts. She has agreed to Category 2 Plan.  Exercise: Leah Cameron has been instructed to work up to a goal of 150 minutes of combined cardio and strengthening exercise per week for weight loss and overall health benefits.   Behavior Modification:  We discussed the following Behavioral Modification Strategies today: increasing lean protein intake, decreasing simple carbohydrates, increasing vegetables, increase H2O intake, increase high fiber foods, no skipping meals, meal planning and cooking strategies, avoiding temptations, and planning for success. We discussed various medication options to help Leah Cameron with her weight loss efforts and we both agreed to continue Zepbound  2.5 mg weekly for medical weight loss and prediabetes. We discussed holding her topamax  and metformin  over the next month due to decreased renal function and plan to recheck labs next visit if not rechecked by PCP in the interim.  Return in about 4 weeks (around 02/20/2024).Leah Cameron She was informed of the importance of frequent follow up visits to maximize her success with intensive lifestyle modifications for her multiple health conditions.  Attestation Statements:   Reviewed by clinician on day of visit: allergies, medications, problem list, medical history, surgical history, family history, social history, and previous encounter notes.   Time spent on visit including pre-visit chart review and post-visit care and charting was 52 minutes.    Leah Galano, PA-C

## 2024-02-14 ENCOUNTER — Ambulatory Visit
Admission: RE | Admit: 2024-02-14 | Discharge: 2024-02-14 | Disposition: A | Discharge status: Home / Self Care | Source: Ambulatory Visit | Attending: Family Medicine | Admitting: Family Medicine

## 2024-02-14 ENCOUNTER — Ambulatory Visit
Admission: RE | Admit: 2024-02-14 | Discharge: 2024-02-14 | Disposition: A | Source: Ambulatory Visit | Attending: Family Medicine

## 2024-02-14 ENCOUNTER — Other Ambulatory Visit: Payer: Self-pay | Admitting: Family Medicine

## 2024-02-14 DIAGNOSIS — N63 Unspecified lump in unspecified breast: Secondary | ICD-10-CM

## 2024-02-14 DIAGNOSIS — R928 Other abnormal and inconclusive findings on diagnostic imaging of breast: Secondary | ICD-10-CM | POA: Diagnosis not present

## 2024-02-14 DIAGNOSIS — N6489 Other specified disorders of breast: Secondary | ICD-10-CM

## 2024-02-14 DIAGNOSIS — N6311 Unspecified lump in the right breast, upper outer quadrant: Secondary | ICD-10-CM | POA: Diagnosis not present

## 2024-02-14 DIAGNOSIS — N6002 Solitary cyst of left breast: Secondary | ICD-10-CM | POA: Diagnosis not present

## 2024-02-19 NOTE — Progress Notes (Unsigned)
 SUBJECTIVE: Discussed the use of AI scribe software for clinical note transcription with the patient, who gave verbal consent to proceed.  Chief Complaint: Obesity  Interim History: She is down 5 lbs since her last visit. Down 19 lbs overall TBW loss of 9.5%  Leah Cameron is here to discuss her progress with her obesity treatment plan. She is on the Category 2 Plan and states she is following her eating plan approximately 95 % of the time. She states she is exercising walking a mile  6 times per week.  Leah Cameron is a 73 year old female with obesity, prediabetes, hypertension, hyperlipidemia, and chronic kidney disease who presents for follow-up of her obesity treatment plan.  She has been on Zepbound  2.5 mg once weekly for hyperlipidemia, prediabetes, and medical weight loss, resulting in a weight loss of 19 pounds. Despite not being at home much, she has been mindful of her protein intake and other dietary considerations, with a reduction in body fat percentage from 42.7% to 39%.  She has a history of prediabetes and has been experiencing cravings. Her metformin  has been held due to a slight decline in renal function, and she has not taken it since her last visit. She is currently fasting and plans to have her renal function rechecked.  She has a history of hypertension and hyperlipidemia. Her last lipid panel showed triglycerides at 68, LDL slightly low, and HDL not too bad. She has been on Zepbound  for a couple of months, and her lipid levels are being monitored.  She has chronic kidney disease, and her renal function has shown a slight decline. A renal ultrasound was performed. Her metformin  and topiramate  were held to see if her renal function would improve. She has not taken these medications since her last visit.  She reports having difficulty with nighttime snacking since stopping topiramate . She has not taken topiramate  since her last visit.  Pharmacotherapy: Zepbound   2.5 mg weekly for medical weight loss.         Metformin  and topiramate  on hold currently for past month due to a decline    In renal function.   OBJECTIVE: Visit Diagnoses: Problem List Items Addressed This Visit     Essential hypertension--hypokalemia, question of primary hyperaldosteronism   Prediabetes - Primary   Relevant Medications   tirzepatide  (ZEPBOUND ) 2.5 MG/0.5ML injection vial   topiramate  (TOPAMAX ) 25 MG tablet   Other Relevant Orders   CMP14+EGFR   Hemoglobin A1c   Insulin , random   Class 1 obesity with serious comorbidity and body mass index (BMI) of 31.0 to 31.9 in adult   Relevant Medications   tirzepatide  (ZEPBOUND ) 2.5 MG/0.5ML injection vial   Other Visit Diagnoses       Hyperlipidemia, unspecified hyperlipidemia type       Relevant Medications   tirzepatide  (ZEPBOUND ) 2.5 MG/0.5ML injection vial   Other Relevant Orders   Lipid Panel With LDL/HDL Ratio     Chronic kidney disease, stage 3a (HCC)       Relevant Orders   Vitamin B12   VITAMIN D  25 Hydroxy (Vit-D Deficiency, Fractures)     BMI 28.0-28.9,adult Current BMI 28.6         Obesity Obesity management with Zepbound  2.5 mg weekly has resulted in a 19-pound weight loss and a decrease in body fat percentage from 42.7% to 39%. Renal function has slightly declined, leading to the temporary discontinuation of metformin  and topiramate . She reports difficulty with nighttime snacking. Zepbound  is expected to improve  renal function and potentially prevent dialysis. Future cost reduction of Zepbound  may facilitate maintenance dosing. Current BMI is 28, qualifying for potential future Medicare coverage for weight management medications. - Rechecked renal function with renal panel. - Ordered A1c, insulin , and lipid panel while fasting. - Checked electrolytes, kidney, and liver function. - Checked B12 level. - Continue Zepbound  2.5 mg weekly. - Consider resuming topiramate  in the evenings if nighttime snacking  persists. - Hold metformin  until renal function is reassessed. - Sent prescription for Zepbound  to pharmacy. - Scheduled follow-up appointment for January 16th.   Prediabetes with cravings Last A1c was 6.4- not at goal. Insulin  14.6- not at goal  Medication(s): Zepbound  2.5 mg SQ weekly Denies mass in neck, dysphagia, dyspepsia, persistent hoarseness, abdominal pain, or N/V/Constipation or diarrhea. Has annual eye exam. Mood is stable.   Holding metformin  and topiramate  with a slight decline in renal function, but may want to resume at some point, especially if stops Zepbound  in near future.   Polyphagia:No- BUT does notice increased cravings off topiramate .  Lab Results  Component Value Date   HGBA1C 6.4 (H) 09/13/2023   HGBA1C 6.5 04/20/2023   HGBA1C 6.3 09/27/2022   HGBA1C 6.4 03/23/2022   HGBA1C 6.3 12/30/2021   Lab Results  Component Value Date   INSULIN  14.6 09/13/2023   INSULIN  11.7 11/09/2022    Plan: Continue and refill Zepbound  2.5 mg SQ weekly Okay to resume topiramate  and monitor renal function, recheck labs today including CMET, A1c, insulin  levels Continue working on nutrition plan to decrease simple carbohydrates, increase lean proteins and exercise to promote weight loss, improve glycemic control and prevent progression to Type 2 diabetes.  Meds ordered this encounter  Medications   tirzepatide  (ZEPBOUND ) 2.5 MG/0.5ML injection vial    Sig: Inject 2.5 mg into the skin once a week.    Dispense:  2 mL    Refill:  0   topiramate  (TOPAMAX ) 25 MG tablet    Sig: Take 2 tablets (50 mg total) by mouth daily.    Dispense:  60 tablet    Refill:  1   Hypertension Hypertension asymptomatic, no significant medication side effects noted, borderline controlled, and needs further observation.  Medication(s): benazepril  40 mg daily   Amlodipine  5 mg daily   Spironolactone  25 mg daily  BP Readings from Last 3 Encounters:  02/20/24 (!) 158/100  02/20/24 (!) 144/88   01/23/24 128/84   Lab Results  Component Value Date   CREATININE 1.08 10/13/2023   CREATININE 1.02 (H) 09/13/2023   CREATININE 1.07 (H) 08/08/2023   Lab Results  Component Value Date   GFR 51.46 (L) 10/13/2023   GFR 58.02 (L) 04/20/2023   GFR 56.86 (L) 09/27/2022    Plan: Continue all antihypertensives at current dosages. Recheck renal function today as had declined with visit with [facility] PCP.  Continue to work on nutrition plan to promote weight loss and improve BP control.    Hyperlipidemia LDL is not at goal. Medication(s): not on statin therapy Cardiovascular risk factors: advanced age (older than 35 for men, 69 for women), dyslipidemia, hypertension, obesity (BMI >= 30 kg/m2), and sedentary lifestyle  Lab Results  Component Value Date   CHOL 174 09/13/2023   HDL 45 09/13/2023   LDLCALC 111 (H) 09/13/2023   TRIG 95 09/13/2023   CHOLHDL 4 04/01/2020   CHOLHDL 4 06/13/2018   CHOLHDL 4 11/16/2017   Lab Results  Component Value Date   ALT 14 10/13/2023   AST 15  10/13/2023   ALKPHOS 66 10/13/2023   BILITOT 0.4 10/13/2023   The 10-year ASCVD risk score (Arnett DK, et al., 2019) is: 12%   Values used to calculate the score:     Age: 47 years     Clincally relevant sex: Female     Is Non-Hispanic African American: Yes     Diabetic: No     Tobacco smoker: No     Systolic Blood Pressure: 158 mmHg     Is BP treated: Yes     HDL Cholesterol: 38 mg/dL     Total Cholesterol: 131 mg/dL  Plan: Will recheck lipids today after being on Zepbound  for past few months.  May need to reconsider statin therapy, but hesitant due to concerns for progression to T2DM as A1c just below and has had 1 A1c of 6.5. She prefers to minimize additional medication if possible.  Continue to work on nutrition plan -decreasing simple carbohydrates, increasing lean proteins, decreasing saturated fats and cholesterol , avoiding trans fats and exercise as able to promote weight loss,  improve lipids and decrease cardiovascular risks.    Chronic Kidney Disease Stage 3a Lab results and trends reviewed. Had Renal US  which did not show any concerning findings.   Patient has hypertension. Last GFR was 49 at [facility] visit 01/06/24. Lab Results  Component Value Date   GFR 51.46 (L) 10/13/2023   GFR 58.02 (L) 04/20/2023   GFR 56.86 (L) 09/27/2022   Lab Results  Component Value Date   CREATININE 1.08 10/13/2023   BUN 25 (H) 10/13/2023   NA 137 10/13/2023   K 3.9 10/13/2023   CL 105 10/13/2023   CO2 26 10/13/2023    Plan: Recheck renal function today along with other labs- Have been holding metformin  and topiramate  for now.  Continues on Zepbound  2.5 mg weekly.  We discussed several lifestyle modifications today and she will continue to work on diet, exercise and weight loss efforts.  Increase water intake. Patient will avoid nephrotoxic medications such as NSAIDs.  Maximize control of prediabetes and hypertension.     Vitals Temp: 98.3 F (36.8 C) BP: (!) 158/100 Pulse Rate: 81 SpO2: 97 %   Anthropometric Measurements Height: 5' 7 (1.702 m) Weight: 186 lb (84.4 kg) BMI (Calculated): 29.12 Weight at Last Visit: 187 lb Weight Lost Since Last Visit: 5 lb Weight Gained Since Last Visit: 0 Starting Weight: 201 lb Total Weight Loss (lbs): 19 lb (8.618 kg)   Body Composition  Body Fat %: 39.6 % Fat Mass (lbs): 72.4 lbs Muscle Mass (lbs): 104.8 lbs Total Body Water (lbs): 65.6 lbs Visceral Fat Rating : 11   Other Clinical Data Fasting: Yes Labs: No Today's Visit #: 13 Starting Date: 11/09/22     ASSESSMENT AND PLAN:  Diet: Berna is currently in the action stage of change. As such, her goal is to continue with weight loss efforts. She has agreed to Category 2 Plan.  Exercise: [employer] has been instructed to work up to a goal of 150 minutes of combined cardio and strengthening exercise per week and to continue exercising as is for weight  loss and overall health benefits.   Behavior Modification:  We discussed the following Behavioral Modification Strategies today: increasing lean protein intake, decreasing simple carbohydrates, increasing vegetables, increase H2O intake, increase high fiber foods, meal planning and cooking strategies, avoiding temptations, and planning for success. We discussed various medication options to help Timiyah with her weight loss efforts and we both agreed to continue  current treatment plan with Zepbound  2.5 mg weekly and consider resumption of topiramate  25 mg nightly for cravings.  Return in about 5 weeks (around 03/26/2024).SABRA She was informed of the importance of frequent follow up visits to maximize her success with intensive lifestyle modifications for her multiple health conditions.  Attestation Statements:   Reviewed by clinician on day of visit: allergies, medications, problem list, medical history, surgical history, family history, social history, and previous encounter notes.   Time spent on visit including pre-visit chart review and post-visit care and charting was 32 minutes.    Christeen Lai, PA-C

## 2024-02-20 ENCOUNTER — Ambulatory Visit (INDEPENDENT_AMBULATORY_CARE_PROVIDER_SITE_OTHER): Payer: Self-pay | Admitting: Physician Assistant

## 2024-02-20 ENCOUNTER — Ambulatory Visit: Payer: Medicare PPO | Admitting: Internal Medicine

## 2024-02-20 ENCOUNTER — Ambulatory Visit (INDEPENDENT_AMBULATORY_CARE_PROVIDER_SITE_OTHER): Admitting: Physician Assistant

## 2024-02-20 ENCOUNTER — Encounter (INDEPENDENT_AMBULATORY_CARE_PROVIDER_SITE_OTHER): Payer: Self-pay | Admitting: Physician Assistant

## 2024-02-20 ENCOUNTER — Encounter: Payer: Self-pay | Admitting: Internal Medicine

## 2024-02-20 VITALS — BP 138/80 | HR 81 | Ht 67.0 in | Wt 186.0 lb

## 2024-02-20 VITALS — BP 144/88 | HR 73 | Temp 98.3°F | Ht 67.0 in | Wt 182.0 lb

## 2024-02-20 DIAGNOSIS — I129 Hypertensive chronic kidney disease with stage 1 through stage 4 chronic kidney disease, or unspecified chronic kidney disease: Secondary | ICD-10-CM

## 2024-02-20 DIAGNOSIS — I10 Essential (primary) hypertension: Secondary | ICD-10-CM

## 2024-02-20 DIAGNOSIS — E042 Nontoxic multinodular goiter: Secondary | ICD-10-CM

## 2024-02-20 DIAGNOSIS — E785 Hyperlipidemia, unspecified: Secondary | ICD-10-CM | POA: Diagnosis not present

## 2024-02-20 DIAGNOSIS — R7303 Prediabetes: Secondary | ICD-10-CM | POA: Diagnosis not present

## 2024-02-20 DIAGNOSIS — E66811 Obesity, class 1: Secondary | ICD-10-CM

## 2024-02-20 DIAGNOSIS — Z6828 Body mass index (BMI) 28.0-28.9, adult: Secondary | ICD-10-CM

## 2024-02-20 DIAGNOSIS — N1831 Chronic kidney disease, stage 3a: Secondary | ICD-10-CM | POA: Diagnosis not present

## 2024-02-20 DIAGNOSIS — Z6831 Body mass index (BMI) 31.0-31.9, adult: Secondary | ICD-10-CM

## 2024-02-20 MED ORDER — TIRZEPATIDE-WEIGHT MANAGEMENT 2.5 MG/0.5ML ~~LOC~~ SOLN: 2.5000 mg | SUBCUTANEOUS | 0 refills | Status: DC

## 2024-02-20 MED ORDER — TOPIRAMATE 25 MG PO TABS: 50.0000 mg | ORAL_TABLET | Freq: Every day | ORAL | 1 refills | Status: DC

## 2024-02-20 NOTE — Progress Notes (Signed)
 Name: Leah Cameron  MRN/ DOB: [id number], [birth year]    Age/ Sex: 73 y.o., female    PCP: Jolee Madelin Patch, MD   Reason for Endocrinology Evaluation: MNG     Date of Initial Endocrinology Evaluation: 08/07/2021    HPI: Leah Cameron is a 73 y.o. female with a past medical history of HTN, multinodular goiter, and fibromyalgia. The patient presented for initial endocrinology clinic visit on 08/07/2021  for consultative assistance with her MNG.   Patient has been diagnosed with multinodular goiter since 2010.  She is status post FNA of the left thyroid  nodule in October 2010 with benign cytology, at the time the nodule was 2.2 cm.  This nodule has had another benign FNA in 2016 with nodule size 2.0 cm at the time.    Thyroid  ultrasound dated April 08, 2021 , the left nodule has enlarged to 5.9 cm, which was biopsied 09/03/2021 with benign cytology  No prior exposure to XRT   Mother with thyroid  disease   SUBJECTIVE:    Today (02/20/24):  [name] is here for a follow up on MNG.    The patient has been following up with [facility] Pt has been noted weight loss, on Tirzepatide   Has noted increase size of local neck swelling, has different left jaw sensation No dysphagia  or pain  No palpitation  No tremors  No diarrhea     HISTORY:  Past Medical History:  Past Medical History:  Diagnosis Date   Allergic rhinitis    Allergy Penicillin   Arthritis    foot hands,legs   Back pain    Back pain, chronic    Cancer (HCC)    breast,left   Fibromyalgia    sees Dr.Davenshwar   GERD (gastroesophageal reflux disease)    HPV (human papilloma virus) infection    Hyperglycemia    Hypertension    Insomnia    Joint pain    Lactose intolerance    Menopause    on lexapro  was started by gyn   Osteopenia    Prediabetes    RLS (restless legs syndrome)    Scoliosis    Stricture esophagus    last EGD  and dilatation 3/09   Syncope 07/2009   admitted thought to be due to over treatment of hypertension   Thyromegaly    Vertigo    went to ED on 10/30   Past Surgical History:  Past Surgical History:  Procedure Laterality Date   BACK SURGERY  09/05/2013   cage and rods L4 L5 (@ [facility])   BIOPSY THYROID   01/2009   negative    BREAST BIOPSY Left 09/06/2017   BREAST EXCISIONAL BIOPSY Left 2000   BUNIONECTOMY Bilateral    COLONOSCOPY     KNEE ARTHROSCOPY  2003   right x1   KNEE SURGERY Left 1999/2002   left  x2   SHOULDER SURGERY  2004   SPINE SURGERY  08/2013   TONSILLECTOMY     TRIGGER FINGER RELEASE  08/2014   R THUMB   TUBAL LIGATION  06/1979    Social History:  reports that she has never smoked. She has never been exposed to tobacco smoke. She has never used smokeless tobacco. She reports current alcohol use. She reports that she does not use drugs. Family History: family history includes Anxiety disorder in her mother; Arthritis in her mother; Breast cancer in an other family member; Coronary artery disease in  an other family member; Dementia in her sister; Depression in her mother; Diabetes in an other family member; Early death in her brother, brother, and brother; Heart disease in her paternal grandfather; Hypertension in her father, mother, and sister; Lung cancer in an other family member; Miscarriages / Stillbirths in her daughter; Stroke in her father, mother, and sister; Thyroid  disease in her mother.   HOME MEDICATIONS: Allergies as of 02/20/2024       Reactions   Cyclobenzaprine Hcl Other (See Comments)   Face swelling   Chlorzoxazone Rash, Swelling   Hydrocodone-acetaminophen Rash   Penicillins Swelling, Rash   Tolerated cefazolin   Tramadol Hcl Itching, Rash        Medication List        Accurate as of February 20, 2024  1:13 PM. If you have any questions, ask your nurse or doctor.          STOP taking these medications    azelastine  0.1 % nasal  spray Commonly known as: ASTELIN  Stopped by: Donell PARAS Afiya Ferrebee       TAKE these medications    albuterol  108 (90 Base) MCG/ACT inhaler Commonly known as: VENTOLIN  HFA Inhale 1-2 puffs into the lungs every 6 (six) hours as needed for wheezing or shortness of breath.   amLODipine  5 MG tablet Commonly known as: NORVASC  Take 1 tablet (5 mg total) by mouth daily.   benazepril  40 MG tablet Commonly known as: LOTENSIN  Take 1 tablet (40 mg total) by mouth daily.   Calcium Carbonate-Vitamin D  600-400 MG-UNIT chew tablet Chew 1 tablet by mouth 2 (two) times daily.   escitalopram  10 MG tablet Commonly known as: LEXAPRO  TAKE 1 TABLET EVERY DAY   fluticasone  50 MCG/ACT nasal spray Commonly known as: FLONASE  Place 2 sprays into both nostrils daily. What changed:  when to take this reasons to take this   metFORMIN  500 MG tablet Commonly known as: GLUCOPHAGE  Take 1 tablet (500 mg total) by mouth daily with breakfast.   multivitamin with minerals Tabs tablet Take 1 tablet by mouth daily.   spironolactone  25 MG tablet Commonly known as: ALDACTONE  Take 1 tablet (25 mg total) by mouth daily.   tirzepatide  2.5 MG/0.5ML injection vial Commonly known as: ZEPBOUND  Inject 2.5 mg into the skin once a week.   topiramate  25 MG tablet Commonly known as: Topamax  Take 2 tablets (50 mg total) by mouth daily.   zolpidem  10 MG tablet Commonly known as: AMBIEN  Take 1 tablet (10 mg total) by mouth at bedtime as needed. for sleep          REVIEW OF SYSTEMS: A comprehensive ROS was conducted with the patient and is negative except as per HPI     OBJECTIVE:  VS: BP (!) 158/100   Pulse 81   Ht 5' 7 (1.702 m)   Wt 186 lb (84.4 kg)   LMP  (LMP Unknown)   SpO2 97%   BMI 29.13 kg/m    Wt Readings from Last 3 Encounters:  02/20/24 186 lb (84.4 kg)  02/20/24 182 lb (82.6 kg)  01/23/24 187 lb (84.8 kg)     EXAM: General: Pt appears well and is in NAD  Neck: General: Supple  without adenopathy. Thyroid : Left thyroid  nodule appreciated.   Lungs: Clear with good BS bilat with no rales, rhonchi, or wheezes  Heart: Auscultation: RRR.  Abdomen: Normoactive bowel sounds, soft, nontender, without masses or organomegaly palpable  Extremities:  BL LE: No pretibial edema normal ROM and  strength.  Mental Status: Judgment, insight: Intact Mood and affect: No depression, anxiety, or agitation     DATA REVIEWED:   Latest Reference Range & Units 11/09/22 10:34  TSH 0.450 - 4.500 uIU/mL 1.440    Thyroid  Ultrasound 06/20/2023  Estimated total number of nodules >/= 1 cm: 5   Number of spongiform nodules >/=  2 cm not described below (TR1): 0   Number of mixed cystic and solid nodules >/= 1.5 cm not described below (TR2): 0   _________________________________________________________   Nodule labeled 1 in the right mid thyroid , 2.2 cm, unchanged. Nodule remains TR 3 and meets criteria for surveillance. Five years of surveillance will be December 2027.   Nodule labeled 2 in the right thyroid  has clear spongiform characteristics and does not meet criteria for surveillance.   Nodule labeled 4 in the left thyroid , occupying the majority of the thyroid  with cystic/spongiform and solid features. 6.4 cm slightly decreased from the prior of 7.2 cm. Nodule has undergone prior biopsy. Assuming benign result no further specific follow-up would be indicated.   Nodule labeled 5 in the left aspect of the isthmus, 1.7 cm, unchanged dating to December 2022. Remains TR 3 and meets criteria for surveillance. Five years would be December 2027.   Nodule labeled 7 in the isthmus, 2.4 cm, decreased from 3.0 cm. Nodule has TR 2/cystic characteristics and does not meet criteria for surveillance.   No adenopathy   IMPRESSION: Redemonstration of heterogeneous enlarged thyroid , as above.   Right mid thyroid  nodule (labeled 1), left isthmic thyroid  nodule (labeled 5) both meet  criteria for surveillance, as designated by the newly established ACR TI-RADS criteria. Surveillance ultrasound study recommended to be performed annually up to 5 years, which would be December 2027.    FNA 09/03/2021  Clinical History: Nodule 4: 5.9 x 3.1 x 4.0 cm nodule occupying the majority of the left thyroid  lobe does not appear significantly changes since prior examination. Prior FNA of this nodule was performed in 2010 and 08/01/2014. Please correlate with prior results. Specimen Submitted:  A. THYROID , LT LOBE NODULE #4, FINE NEEDLE ASPIRATION:   FINAL MICROSCOPIC DIAGNOSIS: - Consistent with benign follicular nodule (Bethesda category II)      ASSESSMENT/PLAN/RECOMMENDATIONS:   Multinodular Goiter:  -Patient is clinically and biochemically euthyroid -She has a large 6.4 cm left thyroid  nodule, this nodule was biopsied in 2010 with benign cytology at 2.2 cm in diameter at the time.  This was again biopsied with benign cytology at 2.0 cm in diameter in 2016.  This was biopsied 08/2021 with benign cytology due to increase in size -Patient with vague left-sided neck symptoms, less likely to be thyroid  related as her symptoms are more directed towards the jaw and her nodule is inferior to that -Will proceed with repeat thyroid  ultrasound - We again discussed interventions to include radiofrequency ablation since patient is not interested in surgical intervention  Follow-up in 1 yr     Signed electronically by: Stefano Redgie Butts, MD  [facility]  [facility] [address] [place], [place] [address] Phone: [phone] FAX: [phone]   CC: Jolee Madelin Patch, MD [address] [place] [address] Phone: [phone] Fax: [phone]   Return to Endocrinology clinic as below: Future Appointments  Date Time Provider Department Center  03/27/2024 12:00 PM Ray[place], Elouise Phlegm, PA-C [facility] None

## 2024-02-22 LAB — CMP14+EGFR
ALT: 16 IU/L (ref 0–32)
AST: 18 IU/L (ref 0–40)
Albumin: 4.4 g/dL (ref 3.8–4.8)
Alkaline Phosphatase: 81 IU/L (ref 49–135)
BUN/Creatinine Ratio: 17 (ref 12–28)
BUN: 19 mg/dL (ref 8–27)
Bilirubin Total: 0.5 mg/dL (ref 0.0–1.2)
CO2: 23 mmol/L (ref 20–29)
Calcium: 9.9 mg/dL (ref 8.7–10.3)
Chloride: 104 mmol/L (ref 96–106)
Creatinine, Ser: 1.11 mg/dL — ABNORMAL HIGH (ref 0.57–1.00)
Globulin, Total: 2.7 g/dL (ref 1.5–4.5)
Glucose: 74 mg/dL (ref 70–99)
Potassium: 4.3 mmol/L (ref 3.5–5.2)
Sodium: 141 mmol/L (ref 134–144)
Total Protein: 7.1 g/dL (ref 6.0–8.5)
eGFR: 53 mL/min/1.73 — ABNORMAL LOW (ref >59)

## 2024-02-22 LAB — LIPID PANEL WITH LDL/HDL RATIO
Cholesterol, Total: 171 mg/dL (ref 100–199)
HDL: 46 mg/dL (ref >39)
LDL Chol Calc (NIH): 110 mg/dL — ABNORMAL HIGH (ref 0–99)
LDL/HDL Ratio: 2.4 ratio (ref 0.0–3.2)
Triglycerides: 82 mg/dL (ref 0–149)
VLDL Cholesterol Cal: 15 mg/dL (ref 5–40)

## 2024-02-22 LAB — VITAMIN B12: Vitamin B-12: 627 pg/mL (ref 232–1245)

## 2024-02-22 LAB — INSULIN, RANDOM: INSULIN: 6.8 u[IU]/mL (ref 2.6–24.9)

## 2024-02-22 LAB — VITAMIN D 25 HYDROXY (VIT D DEFICIENCY, FRACTURES): Vit D, 25-Hydroxy: 41 ng/mL (ref 30.0–100.0)

## 2024-02-22 LAB — HEMOGLOBIN A1C
Est. average glucose Bld gHb Est-mCnc: 120 mg/dL
Hgb A1c MFr Bld: 5.8 % — ABNORMAL HIGH (ref 4.8–5.6)

## 2024-02-23 ENCOUNTER — Telehealth (INDEPENDENT_AMBULATORY_CARE_PROVIDER_SITE_OTHER): Payer: Self-pay | Admitting: *Deleted

## 2024-02-23 NOTE — Telephone Encounter (Signed)
 PA SUBMITTED VIA [employer]OVER[employer]MEDS  (Key: [id number])   MEDICATION DENIED.    Medicare says that drugs used for weight loss are excluded from Part D coverage.

## 2024-02-24 ENCOUNTER — Other Ambulatory Visit (INDEPENDENT_AMBULATORY_CARE_PROVIDER_SITE_OTHER): Payer: Self-pay | Admitting: Physician Assistant

## 2024-02-24 DIAGNOSIS — Z6831 Body mass index (BMI) 31.0-31.9, adult: Secondary | ICD-10-CM

## 2024-02-24 DIAGNOSIS — R7303 Prediabetes: Secondary | ICD-10-CM

## 2024-02-24 DIAGNOSIS — E785 Hyperlipidemia, unspecified: Secondary | ICD-10-CM

## 2024-02-27 DIAGNOSIS — I10 Essential (primary) hypertension: Secondary | ICD-10-CM | POA: Diagnosis not present

## 2024-02-27 DIAGNOSIS — R7303 Prediabetes: Secondary | ICD-10-CM | POA: Diagnosis not present

## 2024-02-27 DIAGNOSIS — F5104 Psychophysiologic insomnia: Secondary | ICD-10-CM | POA: Diagnosis not present

## 2024-02-27 DIAGNOSIS — F411 Generalized anxiety disorder: Secondary | ICD-10-CM | POA: Diagnosis not present

## 2024-03-01 ENCOUNTER — Other Ambulatory Visit: Payer: Self-pay | Admitting: Internal Medicine

## 2024-03-02 ENCOUNTER — Encounter (HOSPITAL_BASED_OUTPATIENT_CLINIC_OR_DEPARTMENT_OTHER): Payer: Self-pay

## 2024-03-02 ENCOUNTER — Ambulatory Visit (HOSPITAL_BASED_OUTPATIENT_CLINIC_OR_DEPARTMENT_OTHER)

## 2024-03-26 NOTE — Progress Notes (Unsigned)
 SUBJECTIVE: Discussed the use of AI scribe software for clinical note transcription with the patient, who gave verbal consent to proceed.  Chief Complaint: Obesity  Interim History: She is down 1 lbs since her last visit.  Down 19 lbs overall   Leah Cameron is here to discuss her progress with her obesity treatment plan. She is on the Category 2 Plan and states she is following her eating plan approximately 100 % of the time. She states she is exercising bike/floor exercises 20 minutes 5 times per week.  Leah Cameron is a 73 year old female with obesity, prediabetes, and chronic kidney disease who presents for a follow-up on her obesity treatment plan.  She has been on Zepbound  2.5 mg once weekly for medical weight loss and has maintained her weight, losing a total of 19 pounds, including a recent loss of 1 pounds. She does not plan to continue Zepbound  at this time.  She feels she needs additional help with appetite control, especially during challenging times like the holiday season.  She has a history of prediabetes and is not currently taking metformin  due to concerns about kidney function. She has previously used topiramate , which was helpful for controlling cravings.  Her hypertension is managed with amlodipine  5 mg daily, benazepril  40 mg daily, and spironolactone  25 mg daily. She experienced a recent episode of elevated blood pressure due to missing several doses of her medication. She and her husband have implemented a new strategy to ensure consistent medication adherence.  She engages in physical activities such as biking, floor workouts, and line dancing with her grandchildren, which she enjoys and finds beneficial for maintaining her weight loss. She has access to weights at home.  OBJECTIVE: Visit Diagnoses: Problem List Items Addressed This Visit     Essential hypertension--hypokalemia, question of primary hyperaldosteronism   Prediabetes - Primary   Relevant  Medications   topiramate  (TOPAMAX ) 25 MG tablet   Class 1 obesity with serious comorbidity and body mass index (BMI) of 31.0 to 31.9 in adult   Other Visit Diagnoses       Hyperlipidemia, unspecified hyperlipidemia type         Chronic kidney disease, stage 3a (HCC)         BMI 28.0-28.9,adult Current BMI 28.5         Obesity Management with Zepbound  2.5 mg once weekly for three months resulted in a 19-pound weight loss overall.   Appetite control remains challenging, especially during holidays.  Previous use of topiramate  was beneficial for cravings. Kidney function has improved, allowing for reconsideration of topiramate  use. - Restart topiramate  at 25 mg daily for one week, then increase to 50 mg if tolerated. - Encouraged continued physical activity, including strength training and dancing. - Advised on strategies for managing appetite during holidays, such as consuming protein before events.  Hypertension Management with amlodipine , benazepril , and spironolactone . Recent elevated blood pressure readings attributed to missed medication doses. Improved adherence with a pill organizer and visual reminders. BP Readings from Last 3 Encounters:  03/27/24 121/80  02/20/24 138/80  02/20/24 (!) 144/88   Lab Results  Component Value Date   NA 141 02/20/2024   CL 104 02/20/2024   K 4.3 02/20/2024   CO2 23 02/20/2024   BUN 19 02/20/2024   CREATININE 1.11 (H) 02/20/2024   EGFR 53 (L) 02/20/2024   CALCIUM 9.9 02/20/2024   ALBUMIN 4.4 02/20/2024   GLUCOSE 74 02/20/2024   Following with PCP at [facility] as well.  -  Continue current antihypertensive regimen. - Maintain use of pill organizer and visual reminders for medication adherence.  Chronic kidney disease, stage 3A Chronic kidney disease stage 3A with improved kidney function. Monitoring is necessary, especially with the reintroduction of topiramate . Lab Results  Component Value Date   NA 141 02/20/2024   K 4.3 02/20/2024    CREATININE 1.11 (H) 02/20/2024   EGFR 53 (L) 02/20/2024   GLUCOSE 74 02/20/2024   GFR has improved off metformin , but was on Zepbound  at this time.  Will monitor closely as she goes off Zepbound .  Will remain off metformin  for now.  Resuming topiramate  for cravings and will monitor.  - Continue to monitor kidney function regularly.  Prediabetes with cravings Management with metformin , currently discontinued due to her decline in renal function. Weight loss and lifestyle modifications are ongoing. She had also been on topiramate  and tolerated this well and would like to resume something for cravings as she transitions off of Zepbound .  Lab Results  Component Value Date   HGBA1C 5.8 (H) 02/20/2024   Continue working on nutrition plan to decrease simple carbohydrates, increase lean proteins and exercise to promote weight loss, improve glycemic control and prevent progression to Type 2 diabetes.  Resume topiramate  25 mg nightly and increase to 50 mg nightly after 1 week.  - Continue lifestyle modifications to manage prediabetes. Meds ordered this encounter  Medications   DISCONTD: topiramate  (TOPAMAX ) 25 MG tablet    Sig: Take 2 tablets (50 mg total) by mouth daily.    Dispense:  60 tablet    Refill:  1   topiramate  (TOPAMAX ) 25 MG tablet    Sig: Take 2 tablets (50 mg total) by mouth daily.    Dispense:  60 tablet    Refill:  3    Vitals Temp: 98.2 F (36.8 C) BP: 121/80 Pulse Rate: 72 SpO2: 100 %   Anthropometric Measurements Height: 5' 7 (1.702 m) Weight: 182 lb (82.6 kg) BMI (Calculated): 28.5 Weight at Last Visit: 182lb Weight Lost Since Last Visit: 0lb Weight Gained Since Last Visit: 0lb Starting Weight: 201lb Total Weight Loss (lbs): 19 lb (8.618 kg)   Body Composition  Body Fat %: 39.3 % Fat Mass (lbs): 71.6 lbs Muscle Mass (lbs): 105 lbs Total Body Water (lbs): 66.8 lbs Visceral Fat Rating : 11   Other Clinical Data Fasting: No Labs: No Today's Visit  #: 14 Starting Date: 11/09/22     ASSESSMENT AND PLAN:  Diet: Leah Cameron is currently in the action stage of change. As such, her goal is to maintain weight for now. She has agreed to Category 2 Plan.  Exercise: Leah Cameron has been instructed to work up to a goal of 150 minutes of combined cardio and strengthening exercise per week and to try a geriatric exercise plan for weight loss and overall health benefits.   Behavior Modification:  We discussed the following Behavioral Modification Strategies today: increasing lean protein intake, decreasing simple carbohydrates, increasing vegetables, increase H2O intake, increase high fiber foods, no skipping meals, meal planning and cooking strategies, holiday eating strategies, avoiding temptations, and planning for success. We discussed various medication options to help Nayvie with her weight loss efforts and we both agreed to stop Zepbound  and resume topiramate  for cravings. She will remain off metformin  at this time.  Return in about 7 weeks (around 05/15/2024).SABRA She was informed of the importance of frequent follow up visits to maximize her success with intensive lifestyle modifications for her multiple health conditions.  Attestation Statements:   Reviewed by clinician on day of visit: allergies, medications, problem list, medical history, surgical history, family history, social history, and previous encounter notes.   Time spent on visit including pre-visit chart review and post-visit care and charting was 30 minutes.    Beckham Capistran, PA-C

## 2024-03-27 ENCOUNTER — Ambulatory Visit (INDEPENDENT_AMBULATORY_CARE_PROVIDER_SITE_OTHER): Admitting: Physician Assistant

## 2024-03-27 ENCOUNTER — Encounter (INDEPENDENT_AMBULATORY_CARE_PROVIDER_SITE_OTHER): Payer: Self-pay | Admitting: Physician Assistant

## 2024-03-27 VITALS — BP 121/80 | HR 72 | Temp 98.2°F | Ht 67.0 in | Wt 182.0 lb

## 2024-03-27 DIAGNOSIS — E785 Hyperlipidemia, unspecified: Secondary | ICD-10-CM

## 2024-03-27 DIAGNOSIS — R7303 Prediabetes: Secondary | ICD-10-CM

## 2024-03-27 DIAGNOSIS — Z6828 Body mass index (BMI) 28.0-28.9, adult: Secondary | ICD-10-CM

## 2024-03-27 DIAGNOSIS — I10 Essential (primary) hypertension: Secondary | ICD-10-CM

## 2024-03-27 DIAGNOSIS — E66811 Obesity, class 1: Secondary | ICD-10-CM

## 2024-03-27 DIAGNOSIS — N1831 Chronic kidney disease, stage 3a: Secondary | ICD-10-CM

## 2024-03-27 MED ORDER — TOPIRAMATE 25 MG PO TABS: 50.0000 mg | ORAL_TABLET | Freq: Every day | ORAL | 3 refills | Status: AC

## 2024-03-27 MED ORDER — TOPIRAMATE 25 MG PO TABS: 50.0000 mg | ORAL_TABLET | Freq: Every day | ORAL | 1 refills | Status: DC

## 2024-05-15 ENCOUNTER — Telehealth (INDEPENDENT_AMBULATORY_CARE_PROVIDER_SITE_OTHER): Payer: Self-pay | Admitting: Physician Assistant

## 2024-05-15 NOTE — Telephone Encounter (Signed)
 Patient had to r/s d/t the weather and is requesting a refill of her zepbound  through [facility].

## 2024-05-16 ENCOUNTER — Ambulatory Visit (INDEPENDENT_AMBULATORY_CARE_PROVIDER_SITE_OTHER): Admitting: Physician Assistant

## 2024-05-16 DIAGNOSIS — N1831 Chronic kidney disease, stage 3a: Secondary | ICD-10-CM

## 2024-05-16 DIAGNOSIS — E66811 Obesity, class 1: Secondary | ICD-10-CM

## 2024-05-16 DIAGNOSIS — I10 Essential (primary) hypertension: Secondary | ICD-10-CM

## 2024-05-16 DIAGNOSIS — R7303 Prediabetes: Secondary | ICD-10-CM

## 2024-05-30 ENCOUNTER — Ambulatory Visit (INDEPENDENT_AMBULATORY_CARE_PROVIDER_SITE_OTHER): Admitting: Physician Assistant
